# Patient Record
Sex: Female | Born: 1975 | Race: White | Hispanic: No | Marital: Married | Smoking: Current every day smoker
Health system: Southern US, Community
[De-identification: ages and names within clinical notes are randomized; demographics above are authoritative.]

## PROBLEM LIST (undated history)

## (undated) DIAGNOSIS — K589 Irritable bowel syndrome without diarrhea: Secondary | ICD-10-CM

## (undated) DIAGNOSIS — I499 Cardiac arrhythmia, unspecified: Secondary | ICD-10-CM

## (undated) DIAGNOSIS — T7840XA Allergy, unspecified, initial encounter: Secondary | ICD-10-CM

## (undated) DIAGNOSIS — G4730 Sleep apnea, unspecified: Secondary | ICD-10-CM

## (undated) DIAGNOSIS — F329 Major depressive disorder, single episode, unspecified: Secondary | ICD-10-CM

## (undated) DIAGNOSIS — N6320 Unspecified lump in the left breast, unspecified quadrant: Secondary | ICD-10-CM

## (undated) DIAGNOSIS — G2581 Restless legs syndrome: Secondary | ICD-10-CM

## (undated) DIAGNOSIS — D649 Anemia, unspecified: Secondary | ICD-10-CM

## (undated) DIAGNOSIS — Z803 Family history of malignant neoplasm of breast: Secondary | ICD-10-CM

## (undated) DIAGNOSIS — J45909 Unspecified asthma, uncomplicated: Secondary | ICD-10-CM

## (undated) DIAGNOSIS — R0600 Dyspnea, unspecified: Secondary | ICD-10-CM

## (undated) DIAGNOSIS — K219 Gastro-esophageal reflux disease without esophagitis: Secondary | ICD-10-CM

## (undated) DIAGNOSIS — R5383 Other fatigue: Secondary | ICD-10-CM

## (undated) DIAGNOSIS — M797 Fibromyalgia: Secondary | ICD-10-CM

## (undated) DIAGNOSIS — F32A Depression, unspecified: Secondary | ICD-10-CM

## (undated) DIAGNOSIS — J680 Bronchitis and pneumonitis due to chemicals, gases, fumes and vapors: Secondary | ICD-10-CM

## (undated) DIAGNOSIS — I341 Nonrheumatic mitral (valve) prolapse: Secondary | ICD-10-CM

## (undated) DIAGNOSIS — F419 Anxiety disorder, unspecified: Secondary | ICD-10-CM

## (undated) DIAGNOSIS — Z8489 Family history of other specified conditions: Secondary | ICD-10-CM

## (undated) HISTORY — DX: Unspecified lump in the left breast, unspecified quadrant: N63.20

## (undated) HISTORY — DX: Other fatigue: R53.83

## (undated) HISTORY — PX: DILATION AND CURETTAGE OF UTERUS: SHX78

## (undated) HISTORY — PX: NASAL SEPTUM SURGERY: SHX37

## (undated) HISTORY — DX: Allergy, unspecified, initial encounter: T78.40XA

## (undated) HISTORY — DX: Family history of malignant neoplasm of breast: Z80.3

## (undated) HISTORY — DX: Bronchitis and pneumonitis due to chemicals, gases, fumes and vapors: J68.0

## (undated) HISTORY — DX: Sleep apnea, unspecified: G47.30

## (undated) HISTORY — DX: Nonrheumatic mitral (valve) prolapse: I34.1

## (undated) HISTORY — DX: Restless legs syndrome: G25.81

## (undated) HISTORY — DX: Irritable bowel syndrome, unspecified: K58.9

## (undated) HISTORY — DX: Gastro-esophageal reflux disease without esophagitis: K21.9

## (undated) HISTORY — DX: Major depressive disorder, single episode, unspecified: F32.9

## (undated) HISTORY — DX: Anxiety disorder, unspecified: F41.9

## (undated) HISTORY — DX: Unspecified asthma, uncomplicated: J45.909

## (undated) HISTORY — DX: Fibromyalgia: M79.7

## (undated) HISTORY — DX: Depression, unspecified: F32.A

---

## 2007-11-15 ENCOUNTER — Ambulatory Visit: Payer: Self-pay | Admitting: Gastroenterology

## 2008-08-07 ENCOUNTER — Emergency Department: Payer: Self-pay | Admitting: Emergency Medicine

## 2009-03-11 ENCOUNTER — Ambulatory Visit: Payer: Self-pay | Admitting: Family Medicine

## 2009-03-13 ENCOUNTER — Ambulatory Visit: Payer: Self-pay | Admitting: Family Medicine

## 2012-01-01 ENCOUNTER — Emergency Department: Payer: Self-pay | Admitting: Emergency Medicine

## 2012-01-20 ENCOUNTER — Encounter: Payer: Self-pay | Admitting: Obstetrics and Gynecology

## 2012-07-21 ENCOUNTER — Observation Stay: Payer: Self-pay | Admitting: Obstetrics and Gynecology

## 2012-08-01 ENCOUNTER — Observation Stay: Payer: Self-pay | Admitting: Obstetrics and Gynecology

## 2012-08-02 ENCOUNTER — Inpatient Hospital Stay: Payer: Self-pay | Admitting: Obstetrics and Gynecology

## 2012-08-02 LAB — CBC WITH DIFFERENTIAL/PLATELET
Basophil #: 0.1 10*3/uL (ref 0.0–0.1)
Basophil %: 0.4 %
Eosinophil #: 0.1 10*3/uL (ref 0.0–0.7)
HCT: 35.3 % (ref 35.0–47.0)
Lymphocyte #: 1.8 10*3/uL (ref 1.0–3.6)
Lymphocyte %: 12.1 %
MCH: 32.7 pg (ref 26.0–34.0)
MCHC: 35 g/dL (ref 32.0–36.0)
MCV: 93 fL (ref 80–100)
Monocyte #: 0.8 x10 3/mm (ref 0.2–0.9)
Neutrophil #: 11.9 10*3/uL — ABNORMAL HIGH (ref 1.4–6.5)
RDW: 13.2 % (ref 11.5–14.5)
WBC: 14.7 10*3/uL — ABNORMAL HIGH (ref 3.6–11.0)

## 2012-08-04 LAB — HEMATOCRIT: HCT: 32.8 % — ABNORMAL LOW (ref 35.0–47.0)

## 2014-02-08 ENCOUNTER — Ambulatory Visit: Payer: Self-pay

## 2014-03-15 ENCOUNTER — Ambulatory Visit: Payer: Self-pay | Admitting: Unknown Physician Specialty

## 2014-06-10 DIAGNOSIS — F32A Depression, unspecified: Secondary | ICD-10-CM

## 2014-06-10 DIAGNOSIS — IMO0001 Reserved for inherently not codable concepts without codable children: Secondary | ICD-10-CM | POA: Insufficient documentation

## 2014-06-10 DIAGNOSIS — J45909 Unspecified asthma, uncomplicated: Secondary | ICD-10-CM | POA: Insufficient documentation

## 2014-06-10 DIAGNOSIS — K219 Gastro-esophageal reflux disease without esophagitis: Secondary | ICD-10-CM

## 2014-06-10 DIAGNOSIS — F419 Anxiety disorder, unspecified: Secondary | ICD-10-CM | POA: Insufficient documentation

## 2014-06-10 DIAGNOSIS — K279 Peptic ulcer, site unspecified, unspecified as acute or chronic, without hemorrhage or perforation: Secondary | ICD-10-CM | POA: Insufficient documentation

## 2014-06-10 DIAGNOSIS — F329 Major depressive disorder, single episode, unspecified: Secondary | ICD-10-CM | POA: Insufficient documentation

## 2014-06-10 HISTORY — DX: Depression, unspecified: F32.A

## 2014-06-10 HISTORY — DX: Peptic ulcer, site unspecified, unspecified as acute or chronic, without hemorrhage or perforation: K27.9

## 2014-06-10 HISTORY — DX: Anxiety disorder, unspecified: F41.9

## 2014-12-24 ENCOUNTER — Ambulatory Visit: Payer: Self-pay | Admitting: Obstetrics and Gynecology

## 2014-12-24 LAB — CBC
HCT: 39.5 % (ref 35.0–47.0)
HCT: 27.2 % — ABNORMAL LOW (ref 35.0–47.0)
HGB: 12.9 g/dL (ref 12.0–16.0)
HGB: 8.7 g/dL — AB (ref 12.0–16.0)
MCH: 31 pg (ref 26.0–34.0)
MCH: 30.3 pg (ref 26.0–34.0)
MCHC: 32.7 g/dL (ref 32.0–36.0)
MCHC: 31.9 g/dL — ABNORMAL LOW (ref 32.0–36.0)
MCV: 95 fL (ref 80–100)
MCV: 95 fL (ref 80–100)
PLATELETS: 240 10*3/uL (ref 150–440)
Platelet: 203 10*3/uL (ref 150–440)
RBC: 4.17 10*6/uL (ref 3.80–5.20)
RBC: 2.87 10*6/uL — AB (ref 3.80–5.20)
RDW: 12.8 % (ref 11.5–14.5)
RDW: 12.6 % (ref 11.5–14.5)
WBC: 16.3 10*3/uL — ABNORMAL HIGH (ref 3.6–11.0)
WBC: 12.3 10*3/uL — ABNORMAL HIGH (ref 3.6–11.0)

## 2014-12-24 LAB — COMPREHENSIVE METABOLIC PANEL
Albumin: 4 g/dL (ref 3.4–5.0)
Alkaline Phosphatase: 40 U/L — ABNORMAL LOW
Anion Gap: 9 (ref 7–16)
BILIRUBIN TOTAL: 0.2 mg/dL (ref 0.2–1.0)
BUN: 10 mg/dL (ref 7–18)
CHLORIDE: 105 mmol/L (ref 98–107)
CO2: 23 mmol/L (ref 21–32)
Calcium, Total: 8.7 mg/dL (ref 8.5–10.1)
Creatinine: 0.93 mg/dL (ref 0.60–1.30)
EGFR (African American): >60
Glucose: 119 mg/dL — ABNORMAL HIGH (ref 65–99)
Osmolality: 274 (ref 275–301)
Potassium: 3.5 mmol/L (ref 3.5–5.1)
SGOT(AST): 9 U/L — ABNORMAL LOW (ref 15–37)
SGPT (ALT): 15 U/L
Sodium: 137 mmol/L (ref 136–145)
TOTAL PROTEIN: 7.4 g/dL (ref 6.4–8.2)

## 2014-12-24 LAB — HCG, QUANTITATIVE, PREGNANCY: BETA HCG, QUANT.: 3262 m[IU]/mL — AB

## 2014-12-25 LAB — CBC WITH DIFFERENTIAL/PLATELET
BASOS PCT: 0.1 %
Basophil #: 0 10*3/uL (ref 0.0–0.1)
EOS ABS: 0 10*3/uL (ref 0.0–0.7)
Eosinophil %: 0 %
HCT: 21.5 % — ABNORMAL LOW (ref 35.0–47.0)
HGB: 7.2 g/dL — ABNORMAL LOW (ref 12.0–16.0)
LYMPHS PCT: 13.3 %
Lymphocyte #: 1.1 10*3/uL (ref 1.0–3.6)
MCH: 31.3 pg (ref 26.0–34.0)
MCHC: 33.4 g/dL (ref 32.0–36.0)
MCV: 94 fL (ref 80–100)
MONO ABS: 0.4 x10 3/mm (ref 0.2–0.9)
Monocyte %: 4.5 %
NEUTROS ABS: 6.7 10*3/uL — AB (ref 1.4–6.5)
Neutrophil %: 82.1 %
PLATELETS: 166 10*3/uL (ref 150–440)
RBC: 2.29 10*6/uL — ABNORMAL LOW (ref 3.80–5.20)
RDW: 12.5 % (ref 11.5–14.5)
WBC: 8.2 10*3/uL (ref 3.6–11.0)

## 2015-04-14 LAB — SURGICAL PATHOLOGY

## 2015-04-20 NOTE — Op Note (Signed)
PATIENT NAME:  Kristy Shaw, Kristy Shaw MR#:  [id number] DATE OF BIRTH:  [birth year]  DATE OF PROCEDURE:  12/24/2014  PREOPERATIVE DIAGNOSIS: Incomplete abortion in first trimester.   POSTOPERATIVE DIAGNOSIS: Incomplete abortion in first trimester.   PROCEDURE: Suction dilation and curettage.  SURGEON: Christeen DouglasBethany Leeza Heiner, MD   ANESTHESIA: General LMA.   ESTIMATED BLOOD LOSS: 500 mL.  OPERATIVE FLUIDS: 500 mL.   COMPLICATIONS: None.   FINDINGS: A 12 week size retroverted uterus with a patulous open cervix to approximately 3 cm. Large clots and products of conception were noted in the cervix and the vagina. Brisk bright red bleeding.   SPECIMENS: Products of conception x2, including those removed from the vagina at the start of the case as well as the suction D and C specimen and endometrial curettings.   CONDITION: Stable.   COMPLICATIONS: None.   INDICATION FOR PROCEDURE: Ms. Kristy Shaw is a 39 year old gravida 3, para 1-0-1-1 who is approximately 11 weeks estimated gestational age by her last menstrual period. She presented to the Emergency Room with heavy vaginal bleeding. She began bleeding 4 days ago. Her vaginal bleeding worsened yesterday with the appearance of large blood clots. By the end of the day the bleeding had tapered somewhat, but increased this morning significantly and is accompanied with cramping. The patient began saturating several pads an hour and began to become dizzy with ambulation. She presented to the Emergency Department where an initial hemoglobin was noted to be 12.9. Over the course of several hours, this dropped to 8.7, and mild tachycardia was noted. An ultrasound found heterogeneous clots in the uterus and lower uterine segment and cervix. Her beta-hCG at this time was 3262. For the reasons noted above, she was consented to come to the operating room for dilation and curettage. The patient and her husband were both present for the consenting process and agreed to  the risks and benefits of the surgery. Initial plan was a same day surgery with discharge later this evening if the patient was meeting discharge criteria; however, after the surgery I think it prudent to admit her for overnight observation. Of note, the patient is blood type A positive with negative antibodies.   DESCRIPTION OF PROCEDURE: The patient was taken to the operating room where she was identified by name and birthdate. She was placed on the operating table in a supine position and general LMA anesthesia was induced without difficulty. She was then positioned in dorsal lithotomy position using candy cane stirrups and prepped and draped in the usual sterile fashion. A bimanual exam noted large clot that appeared to be products of conception in the vaginal vault. These were sent as a specimen. The cervix was noted to be significantly open with bright red bleeding coming from it. A formal timeout procedure was performed with all team members present and in agreement. Her bladder was evacuated through a straight cath for about 300 mL of clear, yellow urine.   No dilation was necessary. The anterior lip of the cervix was grasped with a single-tooth tenaculum and a weighted speculum was placed in the vagina. A 10 rigid, curved curette was introduced into the retroverted uterus for 3 passes. A sharp curettage was undertaken until all 4 quadrants of the uterus were gritty on palpation. A final suction curettage was passed through the uterus. Bimanual exam at this time noted a boggy fundus and still 10 to 12 weeks uterus. Bimanual massage was undertaken and 0.2 mg of Methergine was given IM. At the close  of the procedure, the cervix was still open. Bleeding is minimal. The tenaculum was removed from the anterior lip of the cervix and the speculum was removed from the vagina. Bimanual massage was again undertaken in an attempt to cause the uterus to clamp down. Bleeding remained minimal at the close of the  procedure, but the cervix was still open. General anesthesia was reversed without difficulty, and the patient is stable in recovery.   Of note, 100 mg of doxycycline was given prior to the start of the procedure and 200 mg of oral doxycycline will be given at the close of the procedure when the patient is able to tolerate p.o. My anticipation is to have her admitted for overnight obs on oral Methergine for 24 hours.  ____________________________ Cline Cools, MD beb:sb D: 12/24/2014 16:36:52 ET T: 12/24/2014 16:59:39 ET JOB#: [id number]  cc: Cline Cools, MD, <Dictator> Cline Cools MD ELECTRONICALLY SIGNED 12/31/2014 9:27

## 2015-04-20 NOTE — H&P (Signed)
Subjective/Chief Complaint Vaginal bleeding in first trimester of pregnancy   History of Present Illness 39yo G3P1011 at approx [redacted] weeks EGA by LMP presented to ER with heavy vaginal bleeding x4 days, worsening yesterday with the appearance of clots. Bleeding tapered off overnight, but increased this morning significantly and pt became dizzy with ambulation. She is still bleeding now. HGB dropped in ER from 12.9 to 8.7 with mild tachycardia. Ultrasound noted heterogenous clots in uterus. bhcg 3262. Mild dizziness on ambulation, endorses cramping, stomach tenderness, vaginal bleeding. Denies n/v, diarrhea, constipation, dysuria, SOB, fever, chills.  Blood type A positive with neg antibodies  Present with husband and father in law in room   Past History PMH: GERD, sleep apnea, anxiety/depression, asthma, mitral valve prolapse PSH: Nasal surgery, NSVD x1, WTE POBHx: NSVD x1 at term, SAB in first timester sponteously resolved SH: Smoker 1/2 ppd  Allergies: NKDA Meds: None currently, weaned with positive pregnancy test   Code Status Full Code   Past Med/Surgical Hx:  Mitral Valve Prolapse: pt states, mild case, does not take any meds for it  restless leg syndrome:   Depression:   Anxiety:   Wisdom Tooth Extraction: 10-56yrs ago  Denies surgical history.:   ALLERGIES:  No Known Allergies:   Review of Systems:  ROS See HPI   Physical Exam:  GEN well developed, no acute distress   HEENT PERRL, hearing intact to voice, moist oral mucosa, good dentition   NECK supple  trachea midline   RESP normal resp effort  no use of accessory muscles  wheezing  rhonchi   CARD regular rate  no murmur  No LE edema  no Rub   ABD positive tenderness  normal BS   GU no superpubic tenderness   LYMPH negative neck   EXTR negative cyanosis/clubbing, negative edema   SKIN normal to palpation, skin turgor good   NEURO grossly normal   PSYCH alert, A+O to time, place, person, good insight,  not anxious   Lab Results:  Routine BB:  05-Jan-16 10:05   ABO Group + Rh Type A Positive  Antibody Screen NEGATIVE (Result(s) reported on 24 Dec 2014 at 11:14AM.)  Routine Chem:  05-Jan-16 10:05   HCG Betasubunit Quant. Serum  3262 (1-3  (International Unit)  ----------------- Non-pregnant <5 Weeks Post LMP mIU/mL  3- 4 wk 9 - 130  4- 5 wk 75 - 2,600  5- 6 wk 850 - 20,800  6- 7 wk 4,000 - 100,000  7-12 wk 11,500 - 289,000 12-16 wk 18,000 - 137,000 16-29 wk 1,400 - 53,000 29-41 wk 940 - 60,000)  BUN 10  Creatinine (comp) 0.93  Routine Hem:  05-Jan-16 10:05   WBC (CBC)  16.3  Hemoglobin (CBC) 12.9  Hematocrit (CBC) 39.5  Platelet Count (CBC) 240 (Result(s) reported on 24 Dec 2014 at 10:28AM.)    13:33   WBC (CBC)  12.3  Hemoglobin (CBC)  8.7  Hematocrit (CBC)  27.2  Platelet Count (CBC) 203 (Result(s) reported on 24 Dec 2014 at 01:58PM.)   Radiology Results: [place]:    05-Jan-16 12:34, US OB Less Than 14 Weeks w/ Transvaginal  US OB Less Than 14 Weeks w/ Transvaginal  REASON FOR EXAM:    heavy vaginal bleeding, [redacted] weeks pregnant  COMMENTS:       PROCEDURE: [place]  - US OB LESS THAN 14 WEEKS/W TRANS  - Dec 24 2014 12:34PM     CLINICAL DATA:  Heavy vaginal bleeding.  [redacted] weeks pregnant.  EXAM:  OBSTETRIC <14 WK US AND TRANSVAGINAL OB US    TECHNIQUE:  Both transabdominal and transvaginal ultrasound examinations were  performed for complete evaluation of the gestation as well as the  maternal uterus, adnexal regions, and pelvic cul-de-sac.  Transvaginal technique was performed to assess early pregnancy.  COMPARISON:  None.    FINDINGS:  Intrauterine gestational sac: None    Yolk sac:  None    Embryo:  None    Maternal uterus/adnexae: Uterus measures 12.2 x 4.9 x 6.7 cm. Large  heterogeneous area in the lower uterine segment and cervical region.  There is no significant blood flow within this heterogeneous area  and this is probably represents blood clots.  Endometrial stripe  roughly measures 0.4 cm. Normal appearance of the right ovary  measuring 2.7 x 2.5 x2.8 cm. Left ovary measures 2.2 x 1.6 x 1.8 cm  with a central follicle. No significant free fluid.     IMPRESSION:  No evidence for an intrauterine pregnancy.    Large heterogeneous area in the lower uterine segment and cervical  region. Findings probably related to blood clots and represent an  abortion in progress.      Electronically Signed    By: Richarda OverlieAdam  Henn M.D.    On: 12/24/2014 13:25       Verified By: Arn MedalADAM R. HENN, M.D.,    Assessment/Admission Diagnosis 39yo G3P1011 at 11wks by LMP with incomplete abortion with continued bleeding,mild tachycardia and lab evidence of signficant drop in Hgb by 4 grams   Plan Plan to the OR for suction dilation and curettage for incomplete AB Level 2 - Rh positive, no Rhogam indicated - Doxycycline 100mg  prior to procedure and 200mg  after - Consents signed, including blood transfusion consent. Pt and Father in law at bedside. Risks, benefits and expected outcomes discussed with patient, including bleeding, infection, damage to bladder and bowels with laparoscopic conversion covered. All questions answered.   Electronic Signatures: Cline CoolsBeasley, Payam Gribble E (MD)  (Signed 05-Jan-16 15:36)  Authored: CHIEF COMPLAINT and HISTORY, PAST MEDICAL/SURGIAL HISTORY, ALLERGIES, HOME MEDICATIONS, REVIEW OF SYSTEMS, PHYSICAL EXAM, LABS, Radiology, ASSESSMENT AND PLAN   Last Updated: 05-Jan-16 15:36 by Cline CoolsBeasley, Johnmatthew Solorio E (MD)

## 2015-04-29 NOTE — H&P (Signed)
L&D Evaluation:  History:   HPI 39 y/o G2P0010 @ [redacted]wks gestation arrives with c/o baby moving less and iregular mild uc's, unsure if leaking fluid increased discharge today. Care @ [facility] hx depression (sx improved ceased wellbutrin) smoker 3-5cigs day GBS negative    Presents with contractions    Patient's Medical History No Chronic Illness    Patient's Surgical History none    Medications Pre Natal Vitamins    Allergies NKDA    Social History tobacco    Family History Non-Contributory   ROS:   ROS All systems were reviewed.  HEENT, CNS, GI, GU, Respiratory, CV, Renal and Musculoskeletal systems were found to be normal.   Exam:   Vital Signs stable    Urine Protein not completed    General no apparent distress    Mental Status clear    Chest clear    Heart normal sinus rhythm    Abdomen gravid, non-tender    Estimated Fetal Weight Average for gestational age    Fetal Position vtx    Fundal Height term    Back no CVAT    Edema no edema    Reflexes 1+    Clonus negative    Pelvic no external lesions, 3cm 80% vtx @ -1 BBOW AROM clear fluid nl bloody show    Description clear    FHT normal rate with no decels, baseline 140's 150's avg variability with accels    Fetal Heart Rate 136    Ucx irregular    Skin dry    Lymph no lymphadenopathy   Impression:   Impression early labor   Plan:   Plan EFM/NST, monitor contractions and for cervical change    Comments Admitted, explained plan of care, what to expect, questions answered. Partner supportive at bedside. Plans epidural with labor progress.   Electronic Signatures: Albertina ParrLugiano, Abriel Geesey B (CNM)  (Signed 14-Aug-13 16:52)  Authored: L&D Evaluation   Last Updated: 14-Aug-13 16:52 by Albertina ParrLugiano, Voncile Schwarz B (CNM)

## 2015-09-12 ENCOUNTER — Ambulatory Visit (INDEPENDENT_AMBULATORY_CARE_PROVIDER_SITE_OTHER): Payer: BLUE CROSS/BLUE SHIELD | Admitting: Licensed Clinical Social Worker

## 2015-09-12 DIAGNOSIS — F39 Unspecified mood [affective] disorder: Secondary | ICD-10-CM | POA: Diagnosis not present
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
## 2015-09-23 ENCOUNTER — Ambulatory Visit: Payer: BLUE CROSS/BLUE SHIELD | Admitting: Licensed Clinical Social Worker

## 2015-09-24 ENCOUNTER — Ambulatory Visit (INDEPENDENT_AMBULATORY_CARE_PROVIDER_SITE_OTHER): Payer: BLUE CROSS/BLUE SHIELD | Admitting: Psychiatry

## 2015-09-24 ENCOUNTER — Encounter: Payer: Self-pay | Admitting: Psychiatry

## 2015-09-24 VITALS — BP 118/72 | HR 75 | Temp 97.8°F | Ht 71.0 in | Wt 140.4 lb

## 2015-09-24 DIAGNOSIS — F331 Major depressive disorder, recurrent, moderate: Secondary | ICD-10-CM | POA: Diagnosis not present

## 2015-09-24 DIAGNOSIS — F39 Unspecified mood [affective] disorder: Secondary | ICD-10-CM | POA: Diagnosis not present

## 2015-09-24 NOTE — Progress Notes (Addendum)
Psychiatric Initial Adult Assessment   Patient Identification: Kristy Shaw MRN:  [id number] Date of Evaluation:  09/24/2015 Referral Source: LCSW Chief Complaint:  "My main concern is I and really irritable all the time." Chief Complaint    Establish Care; Depression; Fatigue     Visit Diagnosis:    ICD-9-CM ICD-10-CM   1. Mood disorder (HCC) 296.90 F39   2. Major depressive disorder, recurrent episode, moderate (HCC) 296.32 F33.1    Diagnosis:   Patient Active Problem List   Diagnosis Date Noted  . Anxiety [F41.9] 06/10/2014  . 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 Clinical depression [F32.9] 06/10/2014  . Gastroduodenal ulcer [K27.9] 06/10/2014  . Reflux [K21.9] 06/10/2014   History of Present Illness:  Patient indicates that she has suffered off and on from depression since high school. She states her most recent episode began of over the last few years. She states that in 2005 she was having marital strain in her first marriage and also had a miscarriage. She states that she became talkative, restless and felt like she needed to clean things. She states at that time she did get treatment with medications and attended marital therapy. She indicated that around 2008 or 2009 she started antidepressants and recall that she got good benefit from Effexor for several years. She states eventually she thought the Effexor stopped working.  She states then she and her second marriage in 2013 and they had 1 child together. She relates that she ended up getting pregnant with their second child in 2015 and then had a miscarriage in January 2016. She states she feels like she is in somewhat of a depression where she has a depressed mood, low energy, poor concentration and anhedonia. She also states she has low sex drive and is not sure that this is simply a reaction to having a miscarriage and wanting to avoid activity that would cause her to have recurrent thoughts of her miscarriage or  just simply a factor of her low mood. She indicates that her primary care did start some amitriptyline after the Effexor stopped working and then switched her to Wellbutrin XL while she was in her pregnancy. However patient states that she took the Wellbutrin for 2 months after her miscarriage and then stop. She states most recently her primary care has given her another prescription to restart Effexor but she has not taken it as she was dealing with some side effects from antibiotic for a tick bite earlier this year and she did not want to restart Effexor while she was dealing with the issues from the antibiotic.  She states that she has moods that caused her to be more talkative, restless and clean things. When asked about a hypomanic or manic episode she states she cannot really recall that ever occurring. However she did see Dr. Maryruth Bun in September and October 2015. She states that she was given prescriptions of Depakote which she took for 2 weeks. She states she was given a prescription for Latuda but she never took that medicine.    Elements:  Duration:  As noted above. Associated Signs/Symptoms: Depression Symptoms:  depressed mood, anhedonia, insomnia, difficulty concentrating, anxiety, loss of energy/fatigue, (Hypo) Manic Symptoms:  Irritable Mood, Talking fast, feeling restless, cleaning Anxiety Symptoms:  None Psychotic Symptoms:  None PTSD Symptoms: NA  Past Medical History:  Past Medical History  Diagnosis Date  . Anxiety   . Asthma   . Bipolar disorder (HCC)   . Mitral valve prolapse   .  Depression   . Fatigue   . Restless leg syndrome   . Fibromyalgia     Past Surgical History  Procedure Laterality Date  . Nasal septum surgery     Family History:  Family History  Problem Relation Age of Onset  . Hypertension Mother   . Stroke Mother   . Heart attack Father   . Stroke Father   . Depression Father   . Depression Sister   . Kidney disease Brother   . ADD /  ADHD Brother   . Asthma Brother    Social History:   Social History   Social History  . Marital Status: Married    Spouse Name: N/A  . Number of Children: N/A  . Years of Education: N/A   Social History Main Topics  . Smoking status: Current Every Day Smoker -- 0.50 packs/day    Types: Cigarettes    Start date: 09/23/1990  . Smokeless tobacco: Never Used  . Alcohol Use: No  . Drug Use: No  . Sexual Activity: Not Currently   Other Topics Concern  . None   Social History Narrative  . None   Additional Social History: Patient states she was the middle child and she had 2 older brothers and 2 younger sisters. She states she never felt like she had a colleague or peer amongst her siblings because there was an age difference between her oldest sibling and an age difference between her and her youngest sibling. He was married in 2005 and separated in 2007 from her first husband. She's been married to her second husband since 2013. They have a re--year-old daughter.  Patient graduated from high school. She denies any repetition or special education. She got in an associates degree in criminal justice from a [employer] 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  Musculoskeletal: Strength & Muscle Tone: within normal limits Gait & Station: normal Patient leans: N/A  Psychiatric Specialty Exam: HPI  Review of Systems  Psychiatric/Behavioral: Positive for depression. Negative for suicidal ideas, hallucinations, memory loss and substance abuse. The patient has insomnia. The patient is not nervous/anxious.   All other systems reviewed and are negative.   Blood pressure 118/72, pulse 75, temperature 97.8 F (36.6 C), temperature source Tympanic, height  (1.803 m), weight 140 lb 6.4 oz (63.685 kg), last menstrual period 09/02/2015, SpO2 98 %.Body mass index is 19.59 kg/(m^2).  General  Appearance: Well Groomed  Eye Contact:  Good  Speech:  Normal Rate  Volume:  Normal  Mood:  "blah"  Affect:  Appropriate and Able to smile and laugh  Thought Process:  Linear and Logical  Orientation:  Full (Time, Place, and Person)  Thought Content:  Negative  Suicidal Thoughts:  No  Homicidal Thoughts:  No  Memory:  Immediate;   Good Recent;   Good Remote;   Good  Judgement:  Good  Insight:  Good  Psychomotor Activity:  Negative  Concentration:  Good  Recall:  Good  Fund of Knowledge:Good  Language: Good  Akathisia:  Negative    AIMS (if indicated):  N/A  Assets:  Communication Skills Desire for Improvement Social Support  ADL's:  Intact  Cognition: WNL  Sleep:  poor   Is the patient at risk to self?  No. Has the patient been a risk to self in the past 6 months?  No. Has the patient been a risk to self within the distant  past?  No. Is the patient a risk to others?  No. Has the patient been a risk to others in the past 6 months?  No. Has the patient been a risk to others within the distant past?  No.  Allergies:   Allergies  Allergen Reactions  . Doxycycline Rash   Current Medications: Current Outpatient Prescriptions  Medication Sig Dispense Refill  . albuterol (PROAIR HFA) 108 (90 BASE) MCG/ACT inhaler Inhale into the lungs.    . gabapentin (NEURONTIN) 100 MG capsule Take by mouth.    . venlafaxine (EFFEXOR) 75 MG tablet Take 75 mg by mouth 1 day or 1 dose.     No current facility-administered medications for this visit.    Previous Psychotropic Medications: Yes  Patient Joyce Gross she's had trials of Lexapro, Prozac and felt they did not work. She states she's been on Zoloft and Paxil but that was a long time ago when she can't remember any response to that. She states that Effexor and amitriptyline in the work for her. She states Wellbutrin she is not sure as she did take it for 2 months after her most recent miscarriage. Substance Abuse History in the last 12  months:  No. The patient smokes less than one pack per day for the past 25 years. She states she might drink 2 times a year. She denies any symptoms consistent with an alcohol use disorder. She denies any use of illicit drugs.   Consequences of Substance Abuse: NA  Medical Decision Making:  Established Problem, Worsening (2) and Review of Medication Regimen & Side Effects (2)  Treatment Plan Summary: Medication management and Plan   Major depressive disorder, mixed features Patient reports symptoms of depression with some symptoms consistent with hypomania. However these symptoms do not seem to have risen to the level of a manic episode or hypomanic episode. It is likely that the patient may be more consistent with a major depressive disorder with mixed features. She reports irritability, restlessness, some hyperactivity from cleaning things and increased talking. She has responded well in the past to Effexor. [place] at this time she artery has a prescription for Effexor 75 mg her primary care. She will start this medication and follow up in 1 month. Time we can assess whether to augment with Abilify, Seroquel to augment for depression and perhaps also provide some mood stability for her mixed features. She is receiving Neurontin to manage her pain for/fibromyalgia and thus this is being managed by her primary care physician.  She believes she was given the immediate release form of Effexor. However she states she will check at home and bring in this information to the CMA at her next appointment with her therapist on 09/26/2015.  In regards to risk assessment the patient has affective illness and race as risk factors. She has protective factors of minor child living in the home, prior suicide attempts, engage in treatment, forward thinking, social supports from husband and gender. At this time low risk of imminent harm to herself or others.    Patient contacted the clinic on 09/26/2015. She clarified  that the prescription for Effexor that she has his Effexor XR 75 mg. She also states that in the past her Wellbutrin trials consisted of Wellbutrin XL 300 mg and Wellbutrin SR 100 mg.   Wallace Going 10/5/201612:02 PM

## 2015-09-26 ENCOUNTER — Other Ambulatory Visit: Payer: Self-pay

## 2015-09-26 ENCOUNTER — Ambulatory Visit (INDEPENDENT_AMBULATORY_CARE_PROVIDER_SITE_OTHER): Payer: BLUE CROSS/BLUE SHIELD | Admitting: Licensed Clinical Social Worker

## 2015-09-26 DIAGNOSIS — F39 Unspecified mood [affective] disorder: Secondary | ICD-10-CM | POA: Diagnosis not present

## 2015-09-26 NOTE — Telephone Encounter (Signed)
pt states that she is takin the velafaxine hcl er 75 mg.   she states she also has takin in the past bupropion hcl xr    and bupropion hcl sr  because you had told her that you might add that to what she is already taking

## 2015-09-26 NOTE — Progress Notes (Signed)
   THERAPIST PROGRESS NOTE  Session Time:  Participation Level: Active  Behavioral Response: CasualAlertDepressed  Type of Therapy: Individual Therapy  Treatment Goals addressed: Coping  Interventions: Motivational Interviewing, Solution Focused and Reframing  Summary: Kristy Shaw is a 39 y.o. female who presents with continued symptoms of her diagnosis.  She continues to have difficulty with managing her thoughts and feelings.  She has attempted to use coping skills taught.  Will continue to provide self care.  Explored feelings of being overwhelmed and how to combat.     Suicidal/Homicidal: Nowithout intent/plan  Therapist Response: Assessed mood.  Explored thoughts feelings toward self and other relationship.  Will continue to use coping skills and provide self care  Plan: Return again in 2 weeks.  Diagnosis: Axis I: Generalized Anxiety Disorder    Axis II: No diagnosis    Marinda Elk 09/26/2015

## 2015-10-09 ENCOUNTER — Ambulatory Visit (INDEPENDENT_AMBULATORY_CARE_PROVIDER_SITE_OTHER): Payer: BLUE CROSS/BLUE SHIELD | Admitting: Licensed Clinical Social Worker

## 2015-10-09 DIAGNOSIS — F331 Major depressive disorder, recurrent, moderate: Secondary | ICD-10-CM

## 2015-10-09 DIAGNOSIS — F39 Unspecified mood [affective] disorder: Secondary | ICD-10-CM

## 2015-10-14 NOTE — Progress Notes (Signed)
   THERAPIST PROGRESS NOTE  Session Time: 65min  Participation Level: Active  Behavioral Response: NeatAlertAnxious  Type of Therapy: Individual Therapy  Treatment Goals addressed: Coping  Interventions: CBT, Motivational Interviewing, Solution Focused, Supportive, Family Systems and Reframing  Summary: Kristy Shaw is a 39 y.o. female who presents with continued symptoms of her diagnosis. She expressed her current stressors and the coping strategies that she has attempted.  Role played with Therapist to learn to communicate to her husband and mother in law.  Suicidal/Homicidal: Nowithout intent/plan  Therapist Response: Assessed mood.  Actively listened as she described her current stressors.  Role played communication strategies  Plan: Return again in 2 weeks.  Diagnosis: Axis I: Mood Disorder NOS    Axis II: No diagnosis    Kristy Shaw 10/14/2015

## 2015-10-27 ENCOUNTER — Ambulatory Visit: Payer: Self-pay | Admitting: Psychiatry

## 2015-10-28 ENCOUNTER — Ambulatory Visit (INDEPENDENT_AMBULATORY_CARE_PROVIDER_SITE_OTHER): Payer: BLUE CROSS/BLUE SHIELD | Admitting: Psychiatry

## 2015-10-28 ENCOUNTER — Ambulatory Visit (INDEPENDENT_AMBULATORY_CARE_PROVIDER_SITE_OTHER): Payer: BLUE CROSS/BLUE SHIELD | Admitting: Licensed Clinical Social Worker

## 2015-10-28 ENCOUNTER — Encounter: Payer: Self-pay | Admitting: Psychiatry

## 2015-10-28 VITALS — BP 132/88 | HR 73 | Temp 97.8°F | Ht 71.0 in | Wt 141.2 lb

## 2015-10-28 DIAGNOSIS — F331 Major depressive disorder, recurrent, moderate: Secondary | ICD-10-CM

## 2015-10-28 NOTE — Progress Notes (Signed)
BH MD/PA/NP OP Progress Note  10/28/2015 10:38 AM Kristy Shaw  MRN:  [id number]  Subjective:  Patient returns for follow-up of her major depressive disorder, recurrent, moderate. At the last visit we decided she would move forward with starting Effexor XR. Patient states that she has noticed her mood is improved. She states her irritability has also improved. She feels like the medications working. In regards to side effects she states the only side effect she may be experiencing is a little bit of stimulation feeling but states that it's not interfering with anything or causing her any distress.  She states that her husband has noticed an improvement. She states that her appetite is good. She states she's able to enjoy things and states she is getting ready for the holidays. Chief Complaint: Better Chief Complaint    Follow-up; Medication Refill     Visit Diagnosis:     ICD-9-CM ICD-10-CM   1. Major depressive disorder, recurrent episode, moderate (HCC) 296.32 F33.1     Past Medical History:  Past Medical History  Diagnosis Date  . Anxiety   . Asthma   . Bipolar disorder (HCC)   . Mitral valve prolapse   . Depression   . Fatigue   . Restless leg syndrome   . Fibromyalgia     Past Surgical History  Procedure Laterality Date  . Nasal septum surgery     Family History:  Family History  Problem Relation Age of Onset  . Hypertension Mother   . Stroke Mother   . Heart attack Father   . Stroke Father   . Depression Father   . Depression Sister   . Kidney disease Brother   . ADD / ADHD Brother   . Asthma Brother    Social History:  Social History   Social History  . Marital Status: Married    Spouse Name: N/A  . Number of Children: N/A  . Years of Education: N/A   Social History Main Topics  . Smoking status: Current Every Day Smoker -- 0.50 packs/day    Types: Cigarettes    Start date: 09/23/1990  . Smokeless tobacco: Never Used  . Alcohol Use: No  . Drug  Use: No  . Sexual Activity: Yes    Birth Control/ Protection: None   Other Topics Concern  . None   Social History Narrative   Additional History:   Assessment:   Musculoskeletal: Strength & Muscle Tone: within normal limits Gait & Station: normal Patient leans: N/A  Psychiatric Specialty Exam: HPI  Review of Systems  Psychiatric/Behavioral: Negative for depression, suicidal ideas, memory loss and substance abuse. The patient is not nervous/anxious and does not have insomnia.   All other systems reviewed and are negative.   Blood pressure 132/88, pulse 73, temperature 97.8 F (36.6 C), temperature source Tympanic, height 5\' 11"  (1.803 m), weight 141 lb 3.2 oz (64.048 kg), last menstrual period 10/23/2015, SpO2 98 %.Body mass index is 19.7 kg/(m^2).  General Appearance: Well Groomed  Eye Contact:  Good  Speech:  Normal Rate  Volume:  Normal  Mood:  Better  Affect:  Bright, smiling  Thought Process:  Linear  Orientation:  Full (Time, Place, and Person)  Thought Content:  Negative  Suicidal Thoughts:  No  Homicidal Thoughts:  No  Memory:  Immediate;   Good Recent;   Good Remote;   Good  Judgement:  Good  Insight:  Good  Psychomotor Activity:  Negative  Concentration:  Good  Recall:  Good  Fund of Knowledge: Good  Language: Good  Akathisia:  Negative  Handed:    AIMS (if indicated):    Assets:  Communication Skills Desire for Improvement Social Support  ADL's:  Intact  Cognition: WNL  Sleep:  good   Is the patient at risk to self?  No. Has the patient been a risk to self in the past 6 months?  No. Has the patient been a risk to self within the distant past?  No. Is the patient a risk to others?  No. Has the patient been a risk to others in the past 6 months?  No. Has the patient been a risk to others within the distant past?  No.  Current Medications: Current Outpatient Prescriptions  Medication Sig Dispense Refill  . albuterol (PROAIR HFA) 108 (90 BASE)  MCG/ACT inhaler Inhale into the lungs.    . gabapentin (NEURONTIN) 100 MG capsule Take by mouth.    . meloxicam (MOBIC) 15 MG tablet Take 15 mg by mouth daily.  5  . venlafaxine XR (EFFEXOR-XR) 75 MG 24 hr capsule Take 75 mg by mouth daily with breakfast.     No current facility-administered medications for this visit.    Medical Decision Making:  Established Problem, Stable/Improving (1) and Review of Medication Regimen & Side Effects (2)  Treatment Plan Summary:Medication management and Plan   Major depressive disorder, mixed features patient reports a good response and improvement to Effexor XR 75 mg daily. Thus at this time we will continue her on this regimen without any changes. She indicates she likely has refills from her primary care physician who initially wrote her a prescription for this medication. However I did tell her that most likely her primary care physician is can want me to take over management. Patient indicated that she has a full bottle and when she gets low on that bottle she will contact this office to change over her prescription to this office.  In regards to risk assessment the patient has affective illness and race as risk factors. She has protective factors of minor child living in the home, no prior suicide attempts, engage in treatment, forward thinking, social supports from husband and gender. At this time low risk of imminent harm to herself or others.    Wallace Going 10/28/2015, 10:38 AM

## 2015-10-29 NOTE — Progress Notes (Signed)
   THERAPIST PROGRESS NOTE  Session Time: 60min  Participation Level: Active  Behavioral Response: CasualAlertAppropriate  Type of Therapy: Individual Therapy  Treatment Goals addressed: Coping  Interventions: CBT, Motivational Interviewing, Solution Focused, Strength-based, Supportive, Family Systems and Reframing  Summary: Kristy Shaw is a 39 y.o. female who presents with continued symptoms of her diagnosis although she states that her symptoms are currently tolerable.  She states that she is currently taking her medication as prescribed and has assisted with increasing her tolerance level.  She states that she continues to have the same stressors such as her mother in law and low finances.  She was able to talk through her ability to use coping skills and states that she is working on how to deal with the upcoming holidays with both families.  Suicidal/Homicidal: Negativewithout intent/plan  Therapist Response: LCSW provided Patient with ongoing emotional support and encouragement.  Normalized her feelings.  Commended Patient on her progress and reinforced the importance of client staying focused on her own strengths and resources and resiliency. Processed various strategies for dealing with stressors.    Plan: Return again in 2 weeks.  Diagnosis: Axis I: Major Depression    Axis II: No diagnosis    Kristy Shaw 10/29/2015

## 2015-12-12 ENCOUNTER — Ambulatory Visit (INDEPENDENT_AMBULATORY_CARE_PROVIDER_SITE_OTHER): Payer: Self-pay | Admitting: Licensed Clinical Social Worker

## 2015-12-12 DIAGNOSIS — F331 Major depressive disorder, recurrent, moderate: Secondary | ICD-10-CM

## 2015-12-21 DIAGNOSIS — N6320 Unspecified lump in the left breast, unspecified quadrant: Secondary | ICD-10-CM

## 2015-12-21 HISTORY — DX: Unspecified lump in the left breast, unspecified quadrant: N63.20

## 2015-12-21 HISTORY — PX: BREAST BIOPSY: SHX20

## 2015-12-23 NOTE — Progress Notes (Signed)
   THERAPIST PROGRESS NOTE  Session Time: 60min  Participation Level: Active  Behavioral Response: Casual and NeatAlertAppropriate  Type of Therapy: Individual Therapy  Treatment Goals addressed: Coping  Interventions: CBT, Motivational Interviewing, Solution Focused, Supportive, Family Systems and Reframing  Summary: Deolinda Ostrom is a [id number] y.o. female who presents with continued symptoms of her diagnosis.  She continues to struggle with her moods although she does not get upset as quick as once in the past.  Jill Sidelison continues to struggle with communicating with her husband regarding finances and how she is feeling about his mother being disrespectful.  She is currently working part time cleaning houses.  Explored stressors and how to manage them.  Role played coping skills.  Suicidal/Homicidal: Nowithout intent/plan  Therapist Response: LCSW provided Patient with ongoing emotional support and encouragement.  Normalized her feelings.  Commended Patient on her progress and reinforced the importance of client staying focused on her own strengths and resources and resiliency. Processed various strategies for dealing with stressors.    Plan: Return again in 2 weeks.  Diagnosis: Axis I: Depression    Axis II: No diagnosis    Marinda Elkicole M Peacock 12/11/2016

## 2015-12-26 ENCOUNTER — Ambulatory Visit: Payer: BLUE CROSS/BLUE SHIELD | Admitting: Psychiatry

## 2016-01-13 ENCOUNTER — Ambulatory Visit (INDEPENDENT_AMBULATORY_CARE_PROVIDER_SITE_OTHER): Payer: BLUE CROSS/BLUE SHIELD | Admitting: Licensed Clinical Social Worker

## 2016-01-13 DIAGNOSIS — F331 Major depressive disorder, recurrent, moderate: Secondary | ICD-10-CM

## 2016-01-20 ENCOUNTER — Encounter: Payer: Self-pay | Admitting: Psychiatry

## 2016-01-20 ENCOUNTER — Ambulatory Visit (INDEPENDENT_AMBULATORY_CARE_PROVIDER_SITE_OTHER): Payer: Self-pay | Admitting: Psychiatry

## 2016-01-20 VITALS — BP 118/76 | HR 75 | Temp 97.6°F | Ht 71.0 in | Wt 137.6 lb

## 2016-01-20 DIAGNOSIS — F331 Major depressive disorder, recurrent, moderate: Secondary | ICD-10-CM

## 2016-01-20 MED ORDER — VENLAFAXINE HCL ER 75 MG PO CP24: 75.0000 mg | ORAL_CAPSULE | ORAL | Status: DC

## 2016-01-20 NOTE — Progress Notes (Signed)
BH MD/PA/NP OP Progress Note  01/20/2016 11:02 AM Kristy Shaw  MRN:  [id number]  Subjective:  Patient returns for follow-up of her major depressive disorder, recurrent, moderate. He is doing well on the Effexor and denies any side effects. Patient indicates she is doing well. 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 She states her appetite is good and she is sleeping well. Ever she states she is sleepy a lot of the time and when she sits down she sometimes will fall asleep. She states she tries to avoid this because she does know it can interfere with her sleep at bedtime. Thus with her whether she was having some thyroid issues and she stated she believed her primary care I'll reassess this issue. He also inquired as to whether her primary care could manage her Effexor especially in situations in which she might not be able to afford the co-pays for this clinic. I told her that ideally a psychiatrist monitoring the medication would want to be the one writing for the medicine. However I told her she could explore with her primary care physician they were comfortable managing it. However for now I indicated that the plan should be she continues to come to this clinic for follow-up and if there becomes issues with affordability she could've been approach her primary care at that time.  Chief Complaint: good Chief Complaint    Follow-up; Medication Refill     Visit Diagnosis:     ICD-9-CM ICD-10-CM   1. Major depressive disorder, recurrent episode, moderate (HCC) 296.32 F33.1     Past Medical History:  Past Medical History  Diagnosis Date  . Anxiety   . Asthma   . Bipolar disorder (HCC)   . Mitral valve prolapse   . Depression   . Fatigue   . Restless leg syndrome   . Fibromyalgia     Past Surgical History  Procedure Laterality Date  . Nasal septum surgery     Family History:  Family History  Problem  Relation Age of Onset  . Hypertension Mother   . Stroke Mother   . Heart attack Father   . Stroke Father   . Depression Father   . Depression Sister   . Kidney disease Brother   . ADD / ADHD Brother   . Asthma Brother    Social History:  Social History   Social History  . Marital Status: Married    Spouse Name: N/A  . Number of Children: N/A  . Years of Education: N/A   Social History Main Topics  . Smoking status: Current Every Day Smoker -- 0.50 packs/day    Types: Cigarettes    Start date: 09/23/1990  . Smokeless tobacco: Never Used  . Alcohol Use: No  . Drug Use: No  . Sexual Activity: Yes    Birth Control/ Protection: None   Other Topics Concern  . None   Social History Narrative   Additional History:   Assessment:   Musculoskeletal: Strength & Muscle Tone: within normal limits Gait & Station: normal Patient leans: N/A  Psychiatric Specialty Exam: HPI  Review of Systems  Psychiatric/Behavioral: Negative for depression, suicidal ideas, hallucinations, memory loss and substance abuse. The patient is not nervous/anxious and does not have insomnia.   All other systems reviewed and are negative.   Blood pressure 118/76, pulse 75, temperature 97.6 F (36.4 C), temperature source Tympanic, height   (1.803 m), weight 137 lb 9.6 oz (62.415 kg), last menstrual period 01/13/2016, SpO2 99 %.Body mass index is 19.2 kg/(m^2).  General Appearance: Well Groomed  Eye Contact:  Good  Speech:  Normal Rate  Volume:  Normal  Mood:  good  Affect:  Bright, smiling  Thought Process:  Linear  Orientation:  Full (Time, Place, and Person)  Thought Content:  Negative  Suicidal Thoughts:  No  Homicidal Thoughts:  No  Memory:  Immediate;   Good Recent;   Good Remote;   Good  Judgement:  Good  Insight:  Good  Psychomotor Activity:  Negative  Concentration:  Good  Recall:  Good  Fund of Knowledge: Good  Language: Good  Akathisia:  Negative  Handed:    AIMS (if  indicated):    Assets:  Communication Skills Desire for Improvement Social Support  ADL's:  Intact  Cognition: WNL  Sleep:  good   Is the patient at risk to self?  No. Has the patient been a risk to self in the past 6 months?  No. Has the patient been a risk to self within the distant past?  No. Is the patient a risk to others?  No. Has the patient been a risk to others in the past 6 months?  No. Has the patient been a risk to others within the distant past?  No.  Current Medications: Current Outpatient Prescriptions  Medication Sig Dispense Refill  . albuterol (PROAIR HFA) 108 (90 BASE) MCG/ACT inhaler Inhale into the lungs.    . gabapentin (NEURONTIN) 100 MG capsule Take by mouth.    . venlafaxine XR (EFFEXOR-XR) 75 MG 24 hr capsule Take 1 capsule (75 mg total) by mouth every morning. 30 capsule 4   No current facility-administered medications for this visit.    Medical Decision Making:  Established Problem, Stable/Improving (1) and Review of Medication Regimen & Side Effects (2)  Treatment Plan Summary:Medication management and Plan   Major depressive disorder, mixed features patient reports a good response and improvement to Effexor XR 75 mg daily. Thus at this time we will continue her on this regimen without any changes.  She was already aware of my departure given that she had called to cancel a scheduled appointment and staff informed her. I informed her she would continue treatment with another psychiatrist within this clinic. She's been encouraged call any questions or concerns prior to her next appointment.    Wallace Going 01/20/2016, 11:02 AM

## 2016-01-21 NOTE — Progress Notes (Signed)
   THERAPIST PROGRESS NOTE  Session Time:  Participation Level: Active  Behavioral Response: CasualAlertIrritable  Type of Therapy: Individual Therapy  Treatment Goals addressed: Coping  Interventions: CBT, Motivational Interviewing, Solution Focused, Supportive, Family Systems and Reframing  Summary: Shirelle Tootle is a 40 y.o. female who presents with continued symptoms of her diagnosis.  She was able to vent her frustration and discuss her current stressors.  Patient continues to learn communication strategies to express herself to her husband and mother in law.  Explored alternative to financial budgeting and was able to create a budget for next month.  Continues to be stressed about her inabilty to work fulltime.  Suicidal/Homicidal: Nowithout intent/plan  Therapist Response: LCSW provided Patient with ongoing emotional support and encouragement.  Normalized her feelings.  Commended Patient on her progress and reinforced the importance of client staying focused on her own strengths and resources and resiliency. Processed various strategies for dealing with stressors.    Plan: Return again in 2 weeks.  Diagnosis: Axis I: Bipolar, mixed    Axis II: No diagnosis    Marinda Elk 01/21/2016

## 2016-02-06 ENCOUNTER — Other Ambulatory Visit: Payer: Self-pay | Admitting: Internal Medicine

## 2016-02-06 DIAGNOSIS — Z1231 Encounter for screening mammogram for malignant neoplasm of breast: Secondary | ICD-10-CM

## 2016-02-10 ENCOUNTER — Ambulatory Visit (INDEPENDENT_AMBULATORY_CARE_PROVIDER_SITE_OTHER): Payer: BLUE CROSS/BLUE SHIELD | Admitting: Licensed Clinical Social Worker

## 2016-02-10 DIAGNOSIS — F331 Major depressive disorder, recurrent, moderate: Secondary | ICD-10-CM | POA: Diagnosis not present

## 2016-02-11 NOTE — Progress Notes (Signed)
   THERAPIST PROGRESS NOTE  Session Time:  Participation Level: Active  Behavioral Response: CasualAlertDepressed  Type of Therapy: Individual Therapy  Treatment Goals addressed: Coping  Interventions: CBT, Motivational Interviewing, Solution Focused, Supportive, Family Systems and Reframing  Summary: Kristy Shaw is a 40 y.o. female who presents with continued symptoms of her diagnosis.  Review of management of current and past stressors.  Provided effective coping strategies while Therapist is out of maternity leave.  Discussion of self care in order to ensure reduction in stress management. Provided Patient with a list of Therapist in the area.  Suicidal/Homicidal: Nowithout intent/plan  Therapist Response: LCSW provided Patient with ongoing emotional support and encouragement.  Normalized her feelings.  Commended Patient on her progress and reinforced the importance of client staying focused on her own strengths and resources and resiliency. Processed various strategies for dealing with stressors.    Plan: PRN  Diagnosis: Axis I: Depression    Axis II: No diagnosis    Marinda Elk 02/11/2016

## 2016-02-17 ENCOUNTER — Ambulatory Visit
Admission: RE | Admit: 2016-02-17 | Discharge: 2016-02-17 | Disposition: A | Discharge status: Home / Self Care | Payer: BLUE CROSS/BLUE SHIELD | Source: Ambulatory Visit | Attending: Internal Medicine | Admitting: Internal Medicine

## 2016-02-17 DIAGNOSIS — Z1231 Encounter for screening mammogram for malignant neoplasm of breast: Secondary | ICD-10-CM

## 2016-02-19 ENCOUNTER — Other Ambulatory Visit: Payer: Self-pay | Admitting: Internal Medicine

## 2016-02-19 DIAGNOSIS — R928 Other abnormal and inconclusive findings on diagnostic imaging of breast: Secondary | ICD-10-CM

## 2016-03-02 ENCOUNTER — Ambulatory Visit
Admission: RE | Admit: 2016-03-02 | Discharge: 2016-03-02 | Disposition: A | Discharge status: Home / Self Care | Payer: BLUE CROSS/BLUE SHIELD | Source: Ambulatory Visit | Attending: Internal Medicine | Admitting: Internal Medicine

## 2016-03-02 DIAGNOSIS — R928 Other abnormal and inconclusive findings on diagnostic imaging of breast: Secondary | ICD-10-CM

## 2016-03-02 DIAGNOSIS — N63 Unspecified lump in breast: Secondary | ICD-10-CM | POA: Insufficient documentation

## 2016-03-09 ENCOUNTER — Other Ambulatory Visit: Payer: Self-pay | Admitting: Internal Medicine

## 2016-03-09 DIAGNOSIS — N63 Unspecified lump in unspecified breast: Secondary | ICD-10-CM

## 2016-03-12 ENCOUNTER — Other Ambulatory Visit: Payer: Self-pay | Admitting: Internal Medicine

## 2016-03-12 ENCOUNTER — Ambulatory Visit: Payer: BLUE CROSS/BLUE SHIELD

## 2016-03-12 ENCOUNTER — Ambulatory Visit
Admission: RE | Admit: 2016-03-12 | Discharge: 2016-03-12 | Disposition: A | Discharge status: Home / Self Care | Payer: BLUE CROSS/BLUE SHIELD | Source: Ambulatory Visit | Attending: Internal Medicine | Admitting: Internal Medicine

## 2016-03-12 DIAGNOSIS — R928 Other abnormal and inconclusive findings on diagnostic imaging of breast: Secondary | ICD-10-CM

## 2016-03-12 DIAGNOSIS — N63 Unspecified lump in unspecified breast: Secondary | ICD-10-CM

## 2016-03-18 ENCOUNTER — Ambulatory Visit
Admission: RE | Admit: 2016-03-18 | Discharge: 2016-03-18 | Disposition: A | Discharge status: Home / Self Care | Payer: BLUE CROSS/BLUE SHIELD | Source: Ambulatory Visit | Attending: Internal Medicine | Admitting: Internal Medicine

## 2016-03-18 ENCOUNTER — Other Ambulatory Visit: Payer: Self-pay | Admitting: Internal Medicine

## 2016-03-18 DIAGNOSIS — R928 Other abnormal and inconclusive findings on diagnostic imaging of breast: Secondary | ICD-10-CM

## 2016-03-18 DIAGNOSIS — N63 Unspecified lump in unspecified breast: Secondary | ICD-10-CM

## 2016-03-18 HISTORY — PX: BREAST BIOPSY: SHX20

## 2016-03-25 ENCOUNTER — Encounter: Payer: Self-pay | Admitting: Physical Therapy

## 2016-03-25 ENCOUNTER — Ambulatory Visit: Payer: BLUE CROSS/BLUE SHIELD | Attending: Internal Medicine | Admitting: Physical Therapy

## 2016-03-25 DIAGNOSIS — M6281 Muscle weakness (generalized): Secondary | ICD-10-CM | POA: Insufficient documentation

## 2016-03-25 DIAGNOSIS — M533 Sacrococcygeal disorders, not elsewhere classified: Secondary | ICD-10-CM | POA: Insufficient documentation

## 2016-03-25 DIAGNOSIS — R29810 Facial weakness: Secondary | ICD-10-CM | POA: Diagnosis present

## 2016-03-25 DIAGNOSIS — R279 Unspecified lack of coordination: Secondary | ICD-10-CM | POA: Diagnosis present

## 2016-03-25 DIAGNOSIS — M629 Disorder of muscle, unspecified: Secondary | ICD-10-CM | POA: Insufficient documentation

## 2016-03-25 NOTE — Patient Instructions (Addendum)
You are now ready to begin training the deep core muscles system: diaphragm, transverse abdominis, pelvic floor . These muscles must work together as a team.           The key to these exercises to train the brain to coordinate the timing of these muscles and to have them turn on for long periods of time to hold you upright against gravity (especially important if you are on your feet all day).These muscles are postural muscles and play a role stabilizing your spine and bodyweight. By doing these repetitions slowly and correctly instead of doing crunches, you will achieve a flatter belly without a lower pooch. You are also placing your spine in a more neutral position and breathing properly which in turn, decreases your risk for problems related to your pelvic floor, abdominal, and low back such as pelvic organ prolapse, hernias, diastasis recti (separation of superficial muscles), disk herniations, spinal fractures. These exercises set a solid foundation for you to later progress to resistance/ strength training with therabands and weights and return to other typical fitness exercises with a stronger deeper core.  Do Level 1 and 2 , 30 reps morning and night    BOdy mechanics with household chores (handout)      Log rolling technique    Use CPAP machine

## 2016-03-26 NOTE — Therapy (Signed)
[facility] MAIN [facility] SERVICES [address] [place], [place], [address] Phone: [phone]   Fax:  [phone]  Physical Therapy Evaluation  Patient Details  Name: Kristy Shaw MRN: [id number] Date of Birth: 05/31/1976 Referring Provider: McLean-Scocuzza   Encounter Date: 03/25/2016      PT End of Session - 03/25/16 1530    Visit Number 1   Number of Visits 12   Date for PT Re-Evaluation 06/17/16   PT Start Time 1430   PT Stop Time 1540   PT Time Calculation (min) 70 min   Activity Tolerance Patient tolerated treatment well;No increased pain   Behavior During Therapy [facility] for tasks assessed/performed      Past Medical History  Diagnosis Date  . Anxiety   . Asthma   . Bipolar disorder (HCC)   . Mitral valve prolapse   . Depression   . Fatigue   . Restless leg syndrome   . Fibromyalgia   . 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 Allergy     seasonal  . Sleep apnea   . Sleep apnea     currently not using machine     Past Surgical History  Procedure Laterality Date  . Nasal septum surgery      There were no vitals filed for this visit.       Subjective Assessment - 03/26/16 2305    Subjective Pt c/o low back pain since childbirth 4 years ago. No pain during pregnancy. Vaginal delivery with tear with stitches. 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 After resting 45 min, pain decreases 5-6/10. 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 2)  Pt also has neck/shoulder pain 2/2 fibromyalgia and difficulty getting up in the morning and out of the house.  5/10.  3) difficulty sleeping on L side due to current tenderness on L side following  biopsies for a lump on her breast.  4) urge incontinence  and SUI    Patient Stated Goals  to be as pain free as possible without  medication and would like to get off gabapentin for fibromyalgia.              [facility] PT Assessment - 03/26/16 2232    Assessment   Medical Diagnosis low back pain, fibromyalgia    Referring Provider McLean-Scocuzza    Precautions   Precautions None   Restrictions   Weight Bearing Restrictions No   Balance Screen   Has the patient fallen in the past 6 months No   Observation/Other Assessments   Observations slumped sitting, legs crossed.     Other Surveys  --  PGQ 55%, PDFI 49%    Coordination   Gross Motor Movements are Fluid and Coordinated --  diaphragmatic breathing w/ pelvic floor ROM   Squat   Comments lifting w/ spinal flexion, squat with narrow BOS   Post-Tx: no p! w/ proper body mechanics   Posture/Postural Control   Postural Limitations --  Forward head: 19 cm B from auditory meatus to acromium   Posture Comments lumbopelvic instability w/ hip flexion    AROM   Overall AROM Comments spinal flexion 60%, sidebend B at 30 % with p!,, ext w/ p! (pre-Tx), less pain in all directions , increased spinal flexion 90% (post-Tx)    Palpation  SI assessment  force closure applied at SIJ  with forward bending w/ decreaseed pain, without force closure with pain    Bed Mobility   Bed Mobility --  crunch method                 Pelvic Floor Special Questions - 03/26/16 2237    Diastasis Recti 2 fingers below sternum, above umbilicus (post-Tx: 1.5 fingers)           [facility] Adult PT Treatment/Exercise - 03/26/16 2232    Therapeutic Activites    Other Therapeutic Activities lifting, sweeping and vaccuming mechanics, log roll, toiletiing mechanics,    Neuro Re-ed    Neuro Re-ed Details  see pt instructions   Manual Therapy   Manual therapy comments fascial mobilization in quadriped to address DRA                PT Education - 03/25/16 1529    Education provided Yes   Education Details POC, HEP, goals, anatomy/physiology, body mechanics w/ work tasks     Starwood HotelsPerson(s) Educated Patient   Methods Explanation;Demonstration;Tactile cues;Verbal cues;Handout   Comprehension Returned demonstration;Verbalized understanding             PT Long Term Goals - 03/26/16 2305    PT LONG TERM GOAL #1   Title Pt will be able to get ready in the morning and out of the house < 1 hr in order to demo IND with energy conservation and managed pain.   Time 12   Period Weeks   Status New   PT LONG TERM GOAL #2   Title Pt will decrease her PGQ score from 55% to < 45% in order to perform ADLs and work tasks.   Time 12   Period Weeks   Status New   PT LONG TERM GOAL #3   Title Pt will decrease her PFDI score from 49%    to < 39% to improve pelvic floor function and improve QOL.   Time 12   Period Weeks   Status New   PT LONG TERM GOAL #4   Title Pt will report decrease pain at neck and shoulders from 5/10 to < 2/10 and demo decreased forward head (change in 19 cm acromium to auditory meatus B to < 17 cm)  in order to prepare for the morning to perform ADLs.    Time 12   Period Weeks   Status New   PT LONG TERM GOAL #5   Title Pt will  report decreased urge incontinence and SUI from 2x/ month to 1x/ month in  order to minimize worsening of pelvic floor dysfunction.   Time 12   Period Weeks   PT LONG TERM GOAL #6   Title Pt will demo 1.5 fingers width below sternum in order to increase intraabdominal pressure for postural stability when lifting and cleaning house.   Time 12   Period Weeks   Status New               Plan - 03/26/16 2304    Clinical Impression Statement Pt is a 40 yo female who c/o low back pain, neck/shoulder pain, poor sleep due to positional discomfort, and urge incontinence/ SUI. These deficits impact her work, sleep quality, and QOL. Pt's clinical presentations include pelvic girdle instability, poor body mechanics with loaded activities, pelvic floor dysfunction,  deep core coordination/ strength, and general weakness. After Tx  today, pt reported pain from 7/10 to 4/10, showed increased spinal mobility  without pain, and improved body mechanics with work simulated activities.       Rehab Potential Good   Clinical Impairments Affecting Rehab Potential co-morbidities, smoker, fibromyalgia, and waiting for biopsy reports for breast lumps, occupation: house-cleaning w/ excessive lifting, vaccuming,and floor to stand     PT Frequency 1x / week   PT Duration 12 weeks   PT Treatment/Interventions ADLs/Self Care Home Management;Aquatic Therapy;Cryotherapy;Biofeedback;Moist Heat;Gait training;Neuromuscular re-education;Balance training;Therapeutic exercise;Therapeutic activities;Functional mobility training;Stair training;Patient/family education;Manual techniques;Taping;Energy conservation;Scar mobilization;Electrical Stimulation   Consulted and Agree with Plan of Care Patient      Patient will benefit from skilled therapeutic intervention in order to improve the following deficits and impairments:  Pain, Impaired sensation, Improper body mechanics, Postural dysfunction, Decreased mobility, Decreased coordination, Decreased endurance, Decreased range of motion, Decreased strength, Decreased balance, Difficulty walking, Decreased activity tolerance, Hypermobility  Visit Diagnosis: Sacroiliac dysfunction  Fascial defect  Unspecified lack of coordination  Facial weakness     Problem List Patient Active Problem List   Diagnosis Date Noted  . Anxiety 06/10/2014  . Airway hyperreactivity 06/10/2014  . Clinical depression 06/10/2014  . Gastroduodenal ulcer 06/10/2014  . Reflux 06/10/2014    Mariane Masters ,PT, DPT, E-RYT  03/26/2016, 11:06 PM  [facility] MAIN [facility] SERVICES [address] [place], [place], [address] Phone: [phone]   Fax:  [phone]  Name: Kristy Shaw MRN: [id number] Date of Birth: 1976-12-18

## 2016-03-26 NOTE — Therapy (Deleted)
[facility] [address] [place], [place], [address] Phone: [phone]   Fax:  [phone]  Physical Therapy Evaluation  Patient Details  Name: Kristy Shaw MRN: [id number] Date of Birth: 06/14/1976 Referring Provider: McLean-Scocuzza   Encounter Date: 03/25/2016      PT End of Session - 03/25/16 1530    Visit Number 1   Number of Visits 12   Date for PT Re-Evaluation 06/17/16   PT Start Time 1430   PT Stop Time 1540   PT Time Calculation (min) 70 min   Activity Tolerance Patient tolerated treatment well;No increased pain   [facility] assessed/performed      Past Medical History  Diagnosis Date  . Anxiety   . Asthma   . Bipolar disorder (HCC)   . Mitral valve prolapse   . Depression   . Fatigue   . Restless leg syndrome   . Fibromyalgia   . 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 Allergy     seasonal  . Sleep apnea   . Sleep apnea     currently not using machine     Past Surgical History  Procedure Laterality Date  . Nasal septum surgery      There were no vitals filed for this visit.           [facility] - 03/26/16 2232    Assessment   Medical Diagnosis low back pain, fibromyalgia    Referring Provider McLean-Scocuzza    Precautions   Precautions None   Restrictions   Weight Bearing Restrictions No   Balance Screen   Has the patient fallen in the past 6 months No   Observation/Other Assessments   Observations slumped sitting, legs crossed    Other Surveys  --  PGQ 55%, PDFI 49%    Coordination   Gross Motor Movements are Fluid and Coordinated --  diaphragmatic breathing w/ pelvic floor ROM   Squat   Comments lifting w/ spinal flexion, squat with narrow BOS   Post-Tx: no p! w/ proper body mechanics   Posture/Postural Control   Posture Comments lumbopelvic instability w/ hip flexion    AROM   Overall AROM Comments spinal flexion 60%, sidebend B at  30 % with p!,, ext w/ p! (pre-Tx), less pain in all directions , increased spinal flexion 90% (post-Tx)    Palpation   SI assessment  force closure applied at SIJ  with forward bending w/ decreaseed pain, without force closure with pain    Bed Mobility   Bed Mobility --  crunch method                 Pelvic Floor Special Questions - 03/26/16 2237    Diastasis Recti 2 fingers below sternum, above umbilicus (post-Tx: 1.5 fingers)           [facility] Adult PT Treatment/Exercise - 03/26/16 2232    Therapeutic Activites    Other Therapeutic Activities lifting, sweeping and vaccuming mechanics, log roll, toiletiing mechanics,    Neuro Re-ed    Neuro Re-ed Details  see pt instructions   Manual Therapy   Manual therapy comments fascial mobilization in quadriped to address DRA                PT Education - 03/25/16 1529    Education provided Yes   Education Details POC, HEP, goals, anatomy/physiology, body mechanics w/ work tasks    [employer] HotelsPerson(s) Educated Patient  Methods Explanation;Demonstration;Tactile cues;Verbal cues;Handout   Comprehension Returned demonstration;Verbalized understanding             PT Long Term Goals - 03/26/16 2240    PT LONG TERM GOAL #1   Title Pt will be able to get ready in the morning and out of the house < 1 hr in order to demo IND with energy conservation and managed pain.   Time 12   Period Weeks   Status New   PT LONG TERM GOAL #2   Title Pt will decrease her PGQ score from 55% to < 45% in order to perform ADLs and work tasks.   Time 12   Period Weeks   Status New   PT LONG TERM GOAL #3   Title Pt will decrease her PFDI score from 49%    to < 39% to improve pelvic floor function and improve QOL.   Time 12   Period Weeks   Status New   PT LONG TERM GOAL #4   Title Pt will report decrease pain at neck and shoulders from 5/10 to < 2/10 in order to prepare for the morning to perform ADLs.    Time 12   Period Weeks   Status New    PT LONG TERM GOAL #5   Title Pt will  report decreased urge incontinence and SUI from 2x/ month to 1x/ month in  order to minimize worsening of pelvic floor dysfunction.   Time 12   Period Weeks               Plan - 03/26/16 2242    Clinical Impression Statement Pt is a 40 yo female who c/o low back pain, neck/shoulder pain, poor sleep due to positional discomfort, and urge incontinence/ SUI. These deficits impact her work, sleep quality, and QOL. Pt's clinical presentations include pelvic girdle instability, poor body mechanics with loaded activities, pelvic floor dysfunction,  deep core coordination/ strength, and general weakness. After Tx today, pt reported pain from 7/10 to 4/10, showed increased spinal mobility without pain, and improved body mechanics with work simulated activities.       Rehab Potential Good   Clinical Impairments Affecting Rehab Potential co-morbidities, smoker, fibromyalgia, and waiting for biopsy reports for breast lumps, occupation: house-cleaning w/ excessive lifting, vaccuming,and floor to stand     PT Frequency 1x / week   PT Duration 12 weeks   PT Treatment/Interventions ADLs/Self Care Home Management;Aquatic Therapy;Cryotherapy;Biofeedback;Moist Heat;Gait training;Neuromuscular re-education;Balance training;Therapeutic exercise;Therapeutic activities;Functional mobility training;Stair training;Patient/family education;Manual techniques;Taping;Energy conservation;Scar mobilization;Electrical Stimulation   Consulted and Agree with Plan of Care Patient      Patient will benefit from skilled therapeutic intervention in order to improve the following deficits and impairments:  Pain, Impaired sensation, Improper body mechanics, Postural dysfunction, Decreased mobility, Decreased coordination, Decreased endurance, Decreased range of motion, Decreased strength, Decreased balance, Difficulty walking, Decreased activity tolerance, Hypermobility  Visit  Diagnosis: Sacroiliac dysfunction  Fascial defect  Unspecified lack of coordination  Facial weakness     Problem List Patient Active Problem List   Diagnosis Date Noted  . Anxiety 06/10/2014  . Airway hyperreactivity 06/10/2014  . Clinical depression 06/10/2014  . Gastroduodenal ulcer 06/10/2014  . Reflux 06/10/2014    Mariane Masters ,PT, DPT, E-RYT  03/26/2016, 10:57 PM  [facility] MAIN [facility] SERVICES [address] [place], [place], [address] Phone: [phone]   Fax:  [phone]  Name: Kristy Shaw MRN: [id number] Date of Birth: 1976/10/19

## 2016-03-30 ENCOUNTER — Other Ambulatory Visit: Payer: Self-pay | Admitting: General Surgery

## 2016-03-30 ENCOUNTER — Ambulatory Visit: Payer: BLUE CROSS/BLUE SHIELD | Admitting: Physical Therapy

## 2016-03-30 DIAGNOSIS — M6281 Muscle weakness (generalized): Secondary | ICD-10-CM

## 2016-03-30 DIAGNOSIS — M533 Sacrococcygeal disorders, not elsewhere classified: Secondary | ICD-10-CM | POA: Diagnosis not present

## 2016-03-30 DIAGNOSIS — N6022 Fibroadenosis of left breast: Secondary | ICD-10-CM

## 2016-03-30 DIAGNOSIS — R279 Unspecified lack of coordination: Secondary | ICD-10-CM

## 2016-03-30 NOTE — Patient Instructions (Addendum)
.  DEEP core level 1 , this week perform  On belly with pillow under hips to decrease sway back..  Hands under forehead.  Expand diaphragm in all four directions (particularly posteriorly) on inhale against floor.   10 x 3 rep  __________________________   DEEP core level 2 On your back     __________________________  Upper trap stretch:  opp armpit , tuck chin, radiate fingers tips towards toes 5 breaths    Elbow circles forward to backward direction to glide shoudler blade back and open chest.   __________________________   Floor to rise by the bed:   Log roll up. Ankles to the side of hips. Double kneeling--> tuck toes under , fore arms on bed, push down on forearms and straighten knees Walk feet closer to bed, hands on hips, rock with knees forward, hips and chest roll up.

## 2016-03-31 NOTE — Therapy (Addendum)
[facility] MAIN [facility] SERVICES [address] [place], [place], [address] Phone: [phone]   Fax:  [phone]  Physical Therapy Treatment  Patient Details  Name: Kristy Shaw MRN: [id number] Date of Birth: 1976-10-23 Referring Provider: Shirlee Latch   Encounter Date: 03/30/2016      PT End of Session - 03/30/16 1929    Visit Number 2   Number of Visits 12   Date for PT Re-Evaluation 06/17/16   PT Start Time 1335   PT Stop Time 1435   PT Time Calculation (min) 60 min   Activity Tolerance Patient tolerated treatment well;No increased pain   Behavior During Therapy [facility] for tasks assessed/performed      Past Medical History  Diagnosis Date  . Anxiety   . Asthma   . Bipolar disorder (HCC)   . Mitral valve prolapse   . Depression   . Fatigue   . Restless leg syndrome   . Fibromyalgia   . 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 Allergy     seasonal  . Sleep apnea   . Sleep apnea     currently not using machine     Past Surgical History  Procedure Laterality Date  . Nasal septum surgery      There were no vitals filed for this visit.      Subjective Assessment - 03/30/16 1944    Subjective Pt reported she felt decrease LBP after leaving last session which lasted for the remainder of the day until she was sitting on her couch in a certain way that caused back pain.  Pt has practiced her HEP on the floor but had pain after getting up from the floor. Pt had no pain during the exercises. Pt states she is feeling anxious (7-8/10) about her pre-surgery to remove benign breast lumps.      Patient Stated Goals  to be as pain free as possible without medication and would like to get off gabapentin for fibromyalgia.              [facility] PT Assessment - 03/30/16 1945    Floor to Stand   Comments poor body mechanics , w/ report of pain. (no report of pain post-training with proper body mechanics)    Palpation   Spinal mobility elevated  diaphragm , (post Tx: increased ability to depress ribcage)                   Pelvic Floor Special Questions - 04/08/16 1935    Diastasis Recti 1.5 fingers width below sternum                               Treatment:  Therapeutic Activites: reviewed vacuuming , demonstrated and practiced floor to rise transfers 5 x \ Neuro-reeducation: see pt instructions manual Tx: intercostal STM, tactile cuing on lateral ribs manual Tx: Bilateral CVJ Grade II mobs           PT Education - 04/08/16 1929    Education provided Yes   Education Details HEP   Person(s) Educated Patient   Methods Explanation;Demonstration;Tactile cues;Verbal cues;Handout   Comprehension Returned demonstration;Verbalized understanding            PT Long Term Goals - 03/31/16 2157    PT LONG TERM GOAL #1   Title Pt will be able to get ready in the morning and out of the house < 1 hr in  order to demo IND with energy conservation and managed pain.   Time 12   Period Weeks   Status On-going   PT LONG TERM GOAL #2   Title Pt will decrease her PGQ score from 55% to < 45% in order to perform ADLs and work tasks.   Time 12   Period Weeks   Status On-going   PT LONG TERM GOAL #3   Title Pt will decrease her PFDI score from 49%    to < 39% to improve pelvic floor function and improve QOL.   Time 12   Period Weeks   Status On-going   PT LONG TERM GOAL #4   Title Pt will report decrease pain at neck and shoulders from 5/10 to < 2/10 and demo decreased forward head (change in 19 cm acromium to auditory meatus B to < 17 cm)  in order to prepare for the morning to perform ADLs.    Time 12   Period Weeks   Status On-going   PT LONG TERM GOAL #5   Title Pt will  report decreased urge incontinence and SUI from 2x/ month to 1x/ month in  order to minimize worsening of pelvic floor dysfunction.   Time 12   Period Weeks   Status On-going   PT LONG TERM GOAL #6   Title Pt will demo 1.5 fingers width  below sternum in order to increase intraabdominal pressure for postural stability when lifting and cleaning house.   Time 12   Period Weeks   Status Achieved               Plan - 03/31/16 2145    Clinical Impression Statement Pt is progressing well with decreased diastasis recti and improved deep core coordination. Today pt  improved body mechanics with loaded activities (floor to rise) but required manual Tx to faciliate  depression of ribcage to optimize diaphragmatic breathing. Pt continues to benefit from skilled PT.    Rehab Potential Good   Clinical Impairments Affecting Rehab Potential co-morbidities, smoker, fibromyalgia, and waiting for biopsy reports for breast lumps, occupation: house-cleaning w/ excessive lifting, vaccuming,and floor to stand     PT Frequency 1x / week   PT Duration 12 weeks   PT Treatment/Interventions ADLs/Self Care Home Management;Aquatic Therapy;Cryotherapy;Biofeedback;Moist Heat;Gait training;Neuromuscular re-education;Balance training;Therapeutic exercise;Therapeutic activities;Functional mobility training;Stair training;Patient/family education;Manual techniques;Taping;Energy conservation;Scar mobilization;Electrical Stimulation   Consulted and Agree with Plan of Care Patient      Patient will benefit from skilled therapeutic intervention in order to improve the following deficits and impairments:  Pain, Impaired sensation, Improper body mechanics, Postural dysfunction, Decreased mobility, Decreased coordination, Decreased endurance, Decreased range of motion, Decreased strength, Decreased balance, Difficulty walking, Decreased activity tolerance, Hypermobility  Visit Diagnosis: Unspecified lack of coordination - Plan: PT plan of care cert/re-cert   Weakness (generalized)  - Plan: PT plan of care cert/re-cert     Problem List Patient Active Problem List   Diagnosis Date Noted  . Anxiety 06/10/2014  . Airway hyperreactivity 06/10/2014  .  Clinical depression 06/10/2014  . Gastroduodenal ulcer 06/10/2014  . Reflux 06/10/2014    Mariane MastersYeung,Shin Yiing ,PT, DPT, E-RYT  04/08/2016, 7:49 PM  [place] [facility]IN [facility] [address] [place], [place], [address] Phone: [phone]   Fax:  [phone]  Name: Nancy Fetterlison Selvy MRN: [id number] Date of Birth: 08/12/1976

## 2016-04-01 ENCOUNTER — Encounter: Payer: BLUE CROSS/BLUE SHIELD | Admitting: Physical Therapy

## 2016-04-02 IMAGING — US US BREAST LTD UNI LEFT INC AXILLA
1 series · 11 of 11 positions shown · non-contrast
Comparison: 02/17/2016

CLINICAL DATA: 39-year-old patient with possible mass in distortion
identified on recent baseline 2D screening mammogram. Both of the
patient's grandmothers have a history of breast cancer.

EXAM:
DIGITAL DIAGNOSTIC LEFT MAMMOGRAM WITH 3D TOMOSYNTHESIS WITH CAD
ULTRASOUND LEFT BREAST

[Series 1: us breast ltd uni left inc axilla · 0.05mm/px · 11 of 11 slices shown]
[im 1/11]
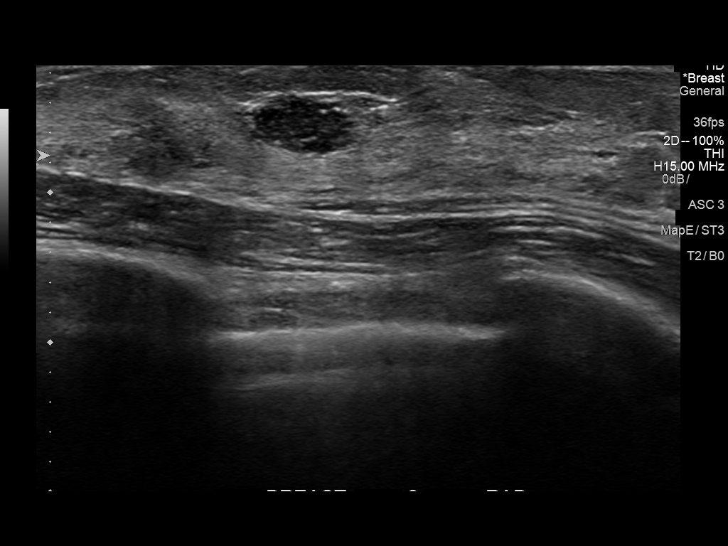
[im 2/11]
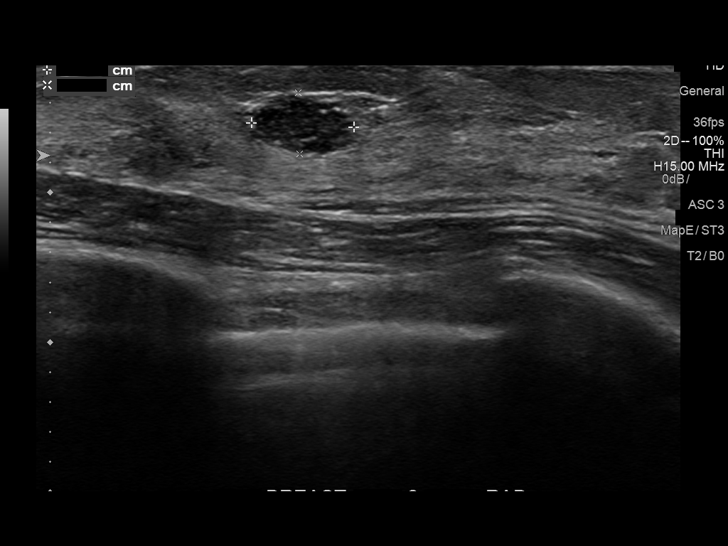
[im 3/11]
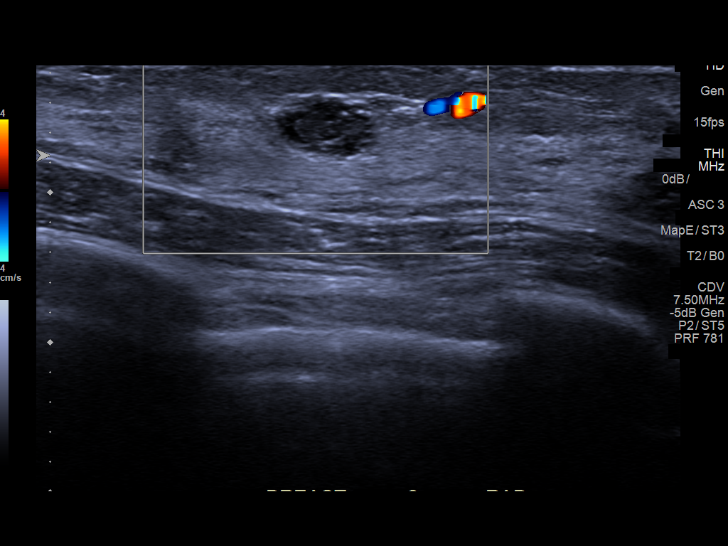
[im 4/11]
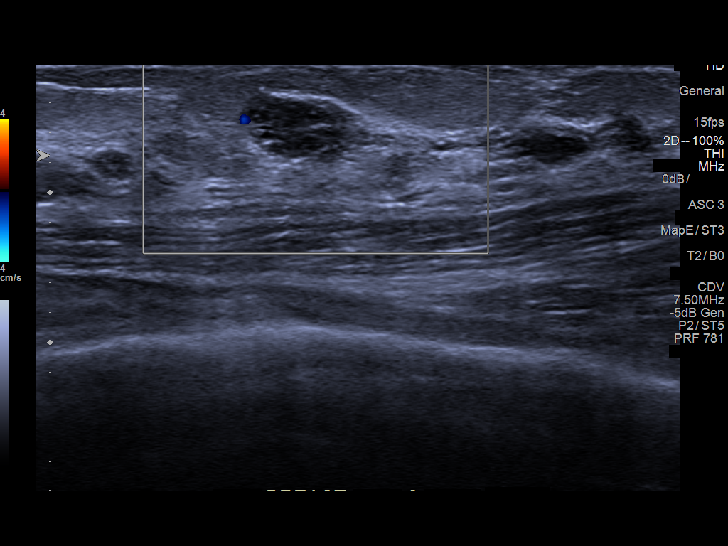
[im 5/11]
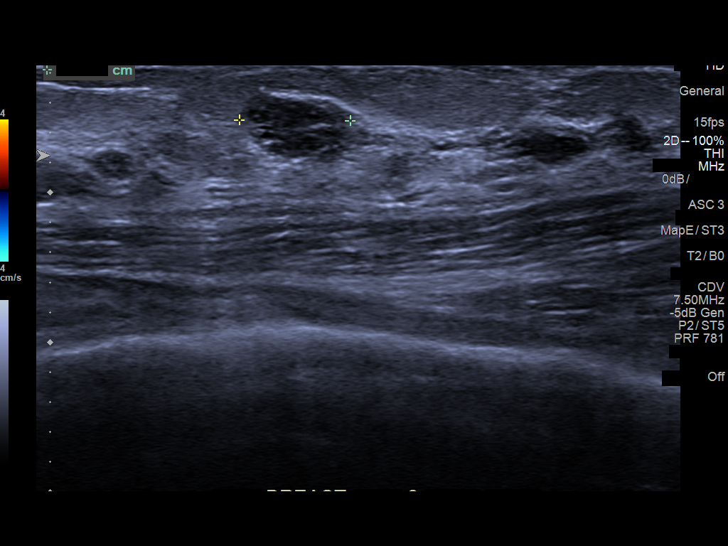
[im 6/11]
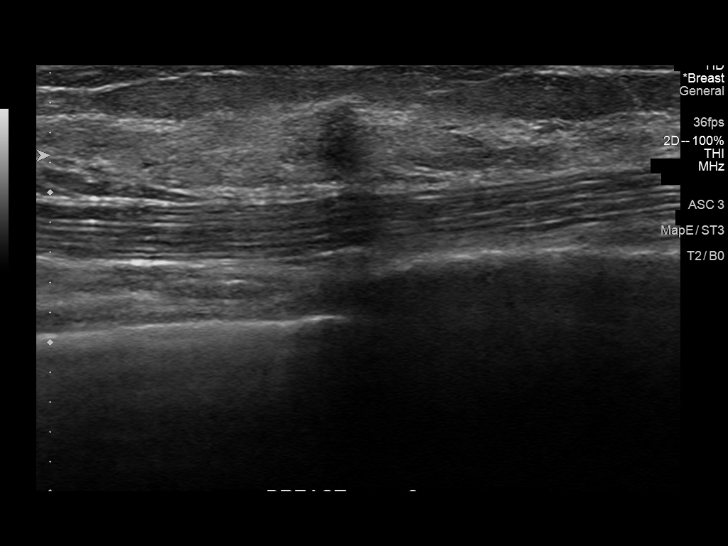
[im 7/11]
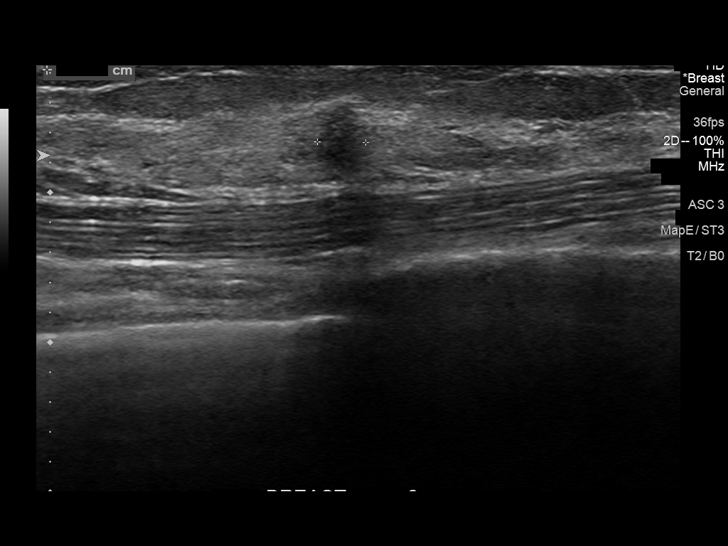
[im 8/11]
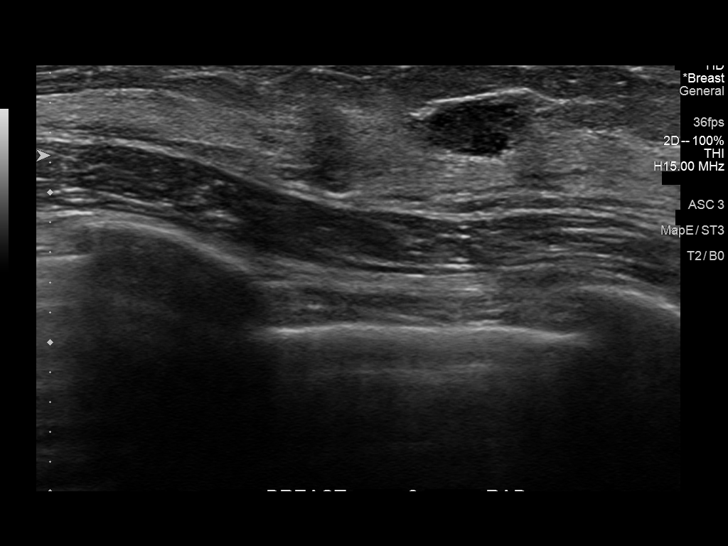
[im 9/11]
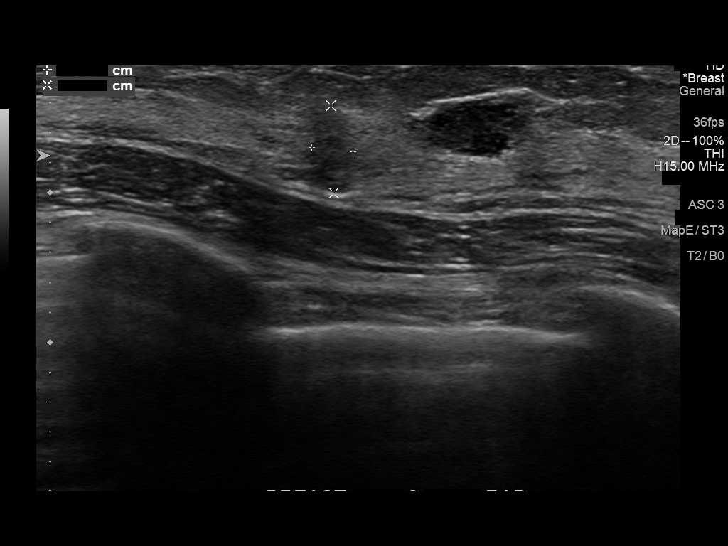
[im 10/11]
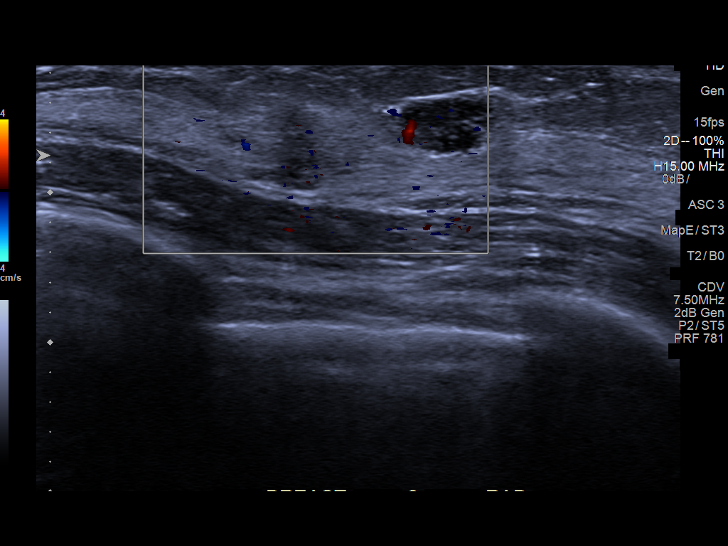
[im 11/11]
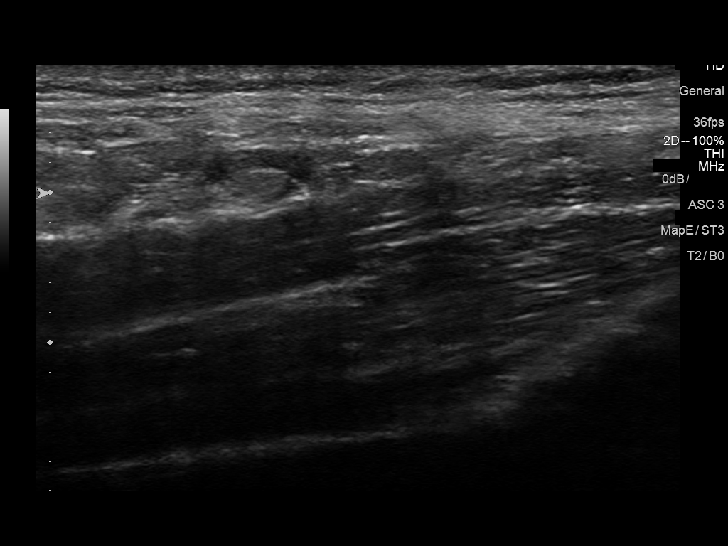

[11 of 11 positions shown; findings below may reference images not displayed]

ACR Breast Density Category c: The breast tissue is heterogeneously
dense, which may obscure small masses.
FINDINGS: Whole breast tomographic views confirm a small mass with associated
distortion in the upper inner left breast.

Mammographic images were processed with CAD.

On physical exam, I do not palpate a discrete mass in the upper
inner quadrant of the left breast. No mass is palpated in the left
axilla.

Targeted ultrasound is performed, showing an irregular hypoechoic
mass with indistinct margins and slight posterior acoustic shadowing
at 10 o'clock position 3 cm from the nipple. This mass measures 3 x
6 x 3 mm. Adjacent to the suspicious mass, but slightly more
lateral, toward 11 o'clock position, is an oval cystic mass with
internal septations/debris that measures 7 x 4 x 7 mm. This mass
appears benign. These 2 masses can be seen in the same imaging
field. Ultrasound of the left axilla shows no evidence of
lymphadenopathy.
IMPRESSION: Suspicious 0.6 x 0.3 x 0.3 cm mass 10 o'clock position left breast.
Malignancy cannot be excluded. Our office will contact ordering
physician and schedule a biopsy.

RECOMMENDATION:

Ultrasound-guided biopsy is recommended.

I have discussed the findings and recommendations with the patient.
Results were also provided in writing at the conclusion of the
visit. If applicable, a reminder letter will be sent to the patient
regarding the next appointment.

BI-RADS CATEGORY  4: Suspicious.

## 2016-04-06 ENCOUNTER — Encounter: Payer: BLUE CROSS/BLUE SHIELD | Admitting: Physical Therapy

## 2016-04-08 ENCOUNTER — Encounter: Payer: BLUE CROSS/BLUE SHIELD | Admitting: Physical Therapy

## 2016-04-08 ENCOUNTER — Ambulatory Visit: Payer: BLUE CROSS/BLUE SHIELD | Admitting: Physical Therapy

## 2016-04-08 DIAGNOSIS — M6281 Muscle weakness (generalized): Secondary | ICD-10-CM

## 2016-04-08 DIAGNOSIS — M533 Sacrococcygeal disorders, not elsewhere classified: Secondary | ICD-10-CM | POA: Diagnosis not present

## 2016-04-08 DIAGNOSIS — R279 Unspecified lack of coordination: Secondary | ICD-10-CM

## 2016-04-08 NOTE — Therapy (Signed)
[facility] MA[facility] [address] [place], [place], [address] Phone: [phone]   Fax:  [phone]  Physical Therapy Treatment  Patient Details  Name: Kristy Shaw MRN: [id number] Date of Birth: 12/21/1975 Referring Provider: Shirlee LatchMcLean   Encounter Date: 04/08/2016      PT End of Session - 04/08/16 1929    Visit Number 3   Number of Visits 12   Date for PT Re-Evaluation 06/17/16   PT Start Time 1310   PT Stop Time 1435   PT Time Calculation (min) 85 min   Activity Tolerance Patient tolerated treatment well;No increased pain   Behavior [facility] Therapy [facility] for tasks assessed/performed      Past Medical History  Diagnosis Date  . Anxiety   . Asthma   . Bipolar disorder (HCC)   . Mitral valve prolapse   . Depression   . Fatigue   . Restless leg syndrome   . Fibromyalgia   . 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 Allergy     seasonal  . Sleep apnea   . Sleep apnea     currently not using machine     Past Surgical History  Procedure Laterality Date  . Nasal septum surgery      There were no vitals filed for this visit.      Subjective Assessment - 04/08/16 1344    Subjective Pt reported she has found it hard to perform her HEP in the morning compared to the night because of caring for her daughter. Pt tried the body mechanics techniques which decreased her back pain 8/10 to 5-6/10. Pt cleaned 2-3x/ week and felt her energy level  and  mm aches have improved.    Patient Stated Goals  to be as pain free as possible without medication and would like to get off gabapentin for fibromyalgia.              [facility] PT Assessment - 04/08/16 1355    Observation/Other Assessments   Observations poor motor control, required stability cues at scapula    Posture/Postural Control   Posture Comments quadriped position with mild cuing for postural stability   AROM   Overall AROM Comments spinal flexion 110 deg no pain.  p! with  sideflexion B and with hip ext (p! at B PSIS)                   Pelvic Floor Special Questions - 04/08/16 1935    Diastasis Recti no fingers   Pelvic Floor Internal Exam pt consented verbally without contraindications    Exam Type Vaginal   Strength fair squeeze, definite lift  posterior > anterior initially, circumferential post Tx   Strength # of reps 3   Strength # of seconds 10   Biofeedback HEP 5 reps, 5 sec            [facility] Adult PT Treatment/Exercise - 04/08/16 1355    Therapeutic Activites    Other Therapeutic Activities see pt instructions   Neuro Re-ed    Neuro Re-ed Details  see pt instructions   Manual Therapy   Manual therapy comments abdominal fascial releases to facilitate relaxation of anterior perineal triangle for more circumferential contraction of pelvic floor mm                 PT Education - 04/08/16 1929    Education provided Yes   Education Details HEP   Person(s) Educated Patient  Methods Explanation;Demonstration;Tactile cues;Verbal cues;Handout   Comprehension Returned demonstration;Verbalized understanding             PT Long Term Goals - 03/31/16 2157    PT LONG TERM GOAL #1   Title Pt will be able to get ready in the morning and out of the house < 1 hr in order to demo IND with energy conservation and managed pain.   Time 12   Period Weeks   Status On-going   PT LONG TERM GOAL #2   Title Pt will decrease her PGQ score from 55% to < 45% in order to perform ADLs and work tasks.   Time 12   Period Weeks   Status On-going   PT LONG TERM GOAL #3   Title Pt will decrease her PFDI score from 49%    to < 39% to improve pelvic floor function and improve QOL.   Time 12   Period Weeks   Status On-going   PT LONG TERM GOAL #4   Title Pt will report decrease pain at neck and shoulders from 5/10 to < 2/10 and demo decreased forward head (change in 19 cm acromium to auditory meatus B to < 17 cm)  in order to prepare for the  morning to perform ADLs.    Time 12   Period Weeks   Status On-going   PT LONG TERM GOAL #5   Title Pt will  report decreased urge incontinence and SUI from 2x/ month to 1x/ month in  order to minimize worsening of pelvic floor dysfunction.   Time 12   Period Weeks   Status On-going   PT LONG TERM GOAL #6   Title Pt will demo 1.5 fingers width below sternum in order to increase intraabdominal pressure for postural stability when lifting and cleaning house.   Time 12   Period Weeks   Status Achieved               Plan - 04/08/16 1929    Clinical Impression Statement Pt showed decreased diastasis recti and improved deep core mm stability. Pt progressed with pelvic floor exercises in addition to core exercises designed to be incorporated with playing with dtr in order to increase compliance withint pt's busy schedule. Required excessive cuing for scapular stabilization with multifidis twist exercise.  Session was longer than usual because pt will not be able to attend session next week and PT ensured pt demo'd understanding with HEP with correct form.     Rehab Potential Good   Clinical Impairments Affecting Rehab Potential co-morbidities, smoker, fibromyalgia, and waiting for biopsy reports for breast lumps, occupation: house-cleaning w/ excessive lifting, vaccuming,and floor to stand     PT Frequency 1x / week   PT Duration 12 weeks   PT Treatment/Interventions ADLs/Self Care Home Management;Aquatic Therapy;Cryotherapy;Biofeedback;Moist Heat;Gait training;Neuromuscular re-education;Balance training;Therapeutic exercise;Therapeutic activities;Functional mobility training;Stair training;Patient/family education;Manual techniques;Taping;Energy conservation;Scar mobilization;Electrical Stimulation   Consulted and Agree with Plan of Care Patient      Patient will benefit from skilled therapeutic intervention in order to improve the following deficits and impairments:  Pain, Impaired  sensation, Improper body mechanics, Postural dysfunction, Decreased mobility, Decreased coordination, Decreased endurance, Decreased range of motion, Decreased strength, Decreased balance, Difficulty walking, Decreased activity tolerance, Hypermobility  Visit Diagnosis: Unspecified lack of coordination  Muscle weakness (generalized)     Problem List Patient Active Problem List   Diagnosis Date Noted  . Anxiety 06/10/2014  . Airway hyperreactivity 06/10/2014  . Clinical depression 06/10/2014  .  Gastroduodenal ulcer 06/10/2014  . Reflux 06/10/2014    Mariane Masters ,PT, DPT, E-RYT  04/08/2016, 7:39 PM  [facility] MAIN [facility] SERVICES [address] [place], [place], [address] Phone: [phone]   Fax:  [phone]  Name: Kristy Shaw MRN: [id number] Date of Birth: [birth year]

## 2016-04-08 NOTE — Addendum Note (Signed)
Addended by: Mariane MastersYEUNG, SHIN-YIING on: 04/08/2016 07:54 PM   Modules accepted: Orders

## 2016-04-08 NOTE — Patient Instructions (Signed)
1) Multifidis twist ( with yellow band) -mini squat ., elbow straight , pencil pits, keep elbows by ribs, turn trunk 20 degs opp of door. KNee and hips dont move. 10 x 2 sets each side    2)  PELVIC FLOOR / KEGEL EXERCISES   Pelvic floor/ Kegel exercises are used to strengthen the muscles in the base of your pelvis that are responsible for supporting your pelvic organs and preventing urine/feces leakage. Based on your therapist's recommendations, they can be performed while standing, sitting, or lying down. Imagine pelvic floor area as a diamond with pelvic landmarks: top =pubic bone, bottom tip=tailbone, sides=sitting bones (ischial tuberosities).    Make yourself aware of this muscle group by using these cues while coordinating your breath:  Inhale, feel pelvic floor diamond area lower like hammock towards your feet and ribcage/belly expanding. Pause. Let the exhale naturally and feel your belly sink, abdominal muscles hugging in around you and you may notice the pelvic diamond draws upward towards your head forming a umbrella shape. Give a squeeze during the exhalation like you are stopping the flow of urine. If you are squeezing the buttock muscles, try to give 50% less effort.   Common Errors:  Breath holding: If you are holding your breath, you may be bearing down against your bladder instead of pulling it up. If you belly bulges up while you are squeezing, you are holding your breath. Be sure to breathe gently in and out while exercising. Counting out loud may help you avoid holding your breath.  Accessory muscle use: You should not see or feel other muscle movement when performing pelvic floor exercises. When done properly, no one can tell that you are performing the exercises. Keep the buttocks, belly and inner thighs relaxed.  Overdoing it: Your muscles can fatigue and stop working for you if you over-exercise. You may actually leak more or feel soreness at the lower abdomen or  rectum.  YOUR HOME EXERCISE PROGRAM  LONG HOLDS: Position: on back  Inhale and then exhale. Then squeeze the muscle and count aloud for  5 seconds. Rest with three long breaths. (Be sure to let belly sink in with exhales and not push outward) Perform 5 repetitions, at night    SHORT HOLDS: Position:  sitting   Inhale and then exhale. Then squeeze the muscle.  (Be sure to let belly sink in with exhales and not push outward)  Perform 5 repetitions, 5  Times/day                      DECREASE DOWNWARD PRESSURE ON  YOUR PELVIC FLOOR, ABDOMINAL, LOW BACK MUSCLES       PRESERVE YOUR PELVIC HEALTH LONG-TERM   ** SQUEEZE pelvic floor BEFORE YOUR SNEEZE, COUGH, LAUGH   ** EXHALE BEFORE YOU RISE AGAINST GRAVITY (lifting, sit to stand, from squat to stand)   ** LOG ROLL OUT OF BED INSTEAD OF CRUNCH/SIT-UP     ______________________  At night: 3)  deep core level '2 30 reps    ______________________ PLAY EXERCISES with Addie x 5 reps of each    Crab crawl (long torso, feet further away heel under knees)  Sideways crawling and also high fives    Bird dog opp arm and leg ( tucked toes, pencils under armpits, yard stick imagery on back (keep neck , back and hips aligned), spread toes of lift leg    Mini squats (knee under toes to activate gluts)  _______________________  

## 2016-04-15 ENCOUNTER — Ambulatory Visit (INDEPENDENT_AMBULATORY_CARE_PROVIDER_SITE_OTHER): Payer: BLUE CROSS/BLUE SHIELD | Admitting: Psychiatry

## 2016-04-15 ENCOUNTER — Ambulatory Visit: Payer: Self-pay | Admitting: Psychiatry

## 2016-04-15 ENCOUNTER — Encounter: Payer: Self-pay | Admitting: Psychiatry

## 2016-04-15 VITALS — BP 118/78 | HR 81 | Temp 98.1°F | Ht 71.0 in | Wt 131.8 lb

## 2016-04-15 DIAGNOSIS — F331 Major depressive disorder, recurrent, moderate: Secondary | ICD-10-CM | POA: Diagnosis not present

## 2016-04-15 MED ORDER — VENLAFAXINE HCL ER 75 MG PO CP24: 75.0000 mg | ORAL_CAPSULE | Freq: Every day | ORAL | Status: DC

## 2016-04-15 NOTE — Progress Notes (Signed)
BH MD/PA/NP OP Progress Note  04/15/2016 10:21 AM Kristy Shaw  MRN:  [id number]  Subjective:  Patient is a 40 year old female who was previously following Dr. Mayford KnifeWilliams. She presented for the follow-up appointment. She has history of major depression. Patient reported that she has recently been diagnosed with a lump in her breast. She stated that she is going for surgery in June as she has some time off around that time. She reported that she has a 41-year-old daughter and her husband is very supportive. She is currently stable on her medication. She takes Effexor at bedtime but it is affecting her sleep. Patient stated that she also takes Neurontin 800 mg due to fibromyalgia and body ache. She has been doing physical therapy on a regular basis. Patient currently denied having any more swings anger anxiety or paranoia.   She  follows with her primary care physician on a regular basis. Patient reported that she does not have any problems with thyroid and she has followed up with her primary care on a regular basis. She feels tired during the daytime.   Chief Complaint: Chief Complaint    Follow-up; Medication Refill     Visit Diagnosis:     ICD-9-CM ICD-10-CM   1. Major depressive disorder, recurrent episode, moderate (HCC) 296.32 F33.1     Past Medical History:  Past Medical History  Diagnosis Date  . Anxiety   . Asthma   . Bipolar disorder (HCC)   . Mitral valve prolapse   . Depression   . Fatigue   . Restless leg syndrome   . Fibromyalgia   . 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 Allergy     seasonal  . Sleep apnea   . Sleep apnea     currently not using machine     Past Surgical History  Procedure Laterality Date  . Nasal septum surgery     Family History:  Family History  Problem Relation Age of Onset  . Hypertension Mother   . Stroke Mother   . Heart attack Father   . Stroke Father   . Depression Father   . Depression Sister   . Kidney disease Brother   . ADD /  ADHD Brother   . Asthma Brother   . Breast cancer Maternal Grandmother [id number]   Social History:  Social History   Social History  . Marital Status: Married    Spouse Name: N/A  . Number of Children: N/A  . Years of Education: N/A   Social History Main Topics  . Smoking status: Current Every Day Smoker -- 0.50 packs/day    Types: Cigarettes    Start date: 09/23/1990  . Smokeless tobacco: Never Used  . Alcohol Use: No  . Drug Use: No  . Sexual Activity: Yes    Birth Control/ Protection: None   Other Topics Concern  . None   Social History Narrative   Additional History:   Assessment:   Musculoskeletal: Strength & Muscle Tone: within normal limits Gait & Station: normal Patient leans: N/A  Psychiatric Specialty Exam: HPI  Review of Systems  Psychiatric/Behavioral: Negative for depression, suicidal ideas, hallucinations, memory loss and substance abuse. The patient is not nervous/anxious and does not have insomnia.   All other systems reviewed and are negative.   Blood pressure 118/78, pulse 81, temperature 98.1 F (36.7 C), temperature source Tympanic, height 5\' 11"  (1.803 m), weight 131 lb 12.8 oz (59.784 kg), last menstrual period 04/08/2016, SpO2 98 %.Body mass index is  18.39 kg/(m^2).  General Appearance: Well Groomed  Eye Contact:  Good  Speech:  Normal Rate  Volume:  Normal  Mood:  good  Affect:  Bright, smiling  Thought Process:  Linear  Orientation:  Full (Time, Place, and Person)  Thought Content:  Negative  Suicidal Thoughts:  No  Homicidal Thoughts:  No  Memory:  Immediate;   Good Recent;   Good Remote;   Good  Judgement:  Good  Insight:  Good  Psychomotor Activity:  Negative  Concentration:  Good  Recall:  Good  Fund of Knowledge: Good  Language: Good  Akathisia:  Negative  Handed:    AIMS (if indicated):    Assets:  Communication Skills Desire for Improvement Social Support  ADL's:  Intact  Cognition: WNL  Sleep:  good   Is the patient  at risk to self?  No. Has the patient been a risk to self in the past 6 months?  No. Has the patient been a risk to self within the distant past?  No. Is the patient a risk to others?  No. Has the patient been a risk to others in the past 6 months?  No. Has the patient been a risk to others within the distant past?  No.  Current Medications: Current Outpatient Prescriptions  Medication Sig Dispense Refill  . albuterol (PROAIR HFA) 108 (90 BASE) MCG/ACT inhaler Inhale into the lungs.    . fluconazole (DIFLUCAN) 150 MG tablet Take 150 mg by mouth daily.  0  . gabapentin (NEURONTIN) 100 MG capsule Take by mouth.    . gabapentin (NEURONTIN) 400 MG capsule Take 400 mg by mouth 2 (two) times daily.  2  . NEOMYCIN-POLYMYXIN-HYDROCORTISONE (CORTISPORIN) 1 % SOLN otic solution 2 (two) times daily.  0  . venlafaxine XR (EFFEXOR-XR) 75 MG 24 hr capsule Take 1 capsule (75 mg total) by mouth every morning. 30 capsule 4   No current facility-administered medications for this visit.    Medical Decision Making:  Established Problem, Stable/Improving (1) and Review of Medication Regimen & Side Effects (2)  Treatment Plan Summary:Medication management and Plan   Major depressive disorder, mixed features patient reports a good response and improvement to Effexor XR 75 mg daily. Thus at this time we will continue her on this regimen without any changes.  Patient also takes Neurontin 800 mg at bedtime. Discussed with her about her medications and she will continue them on a regular basis.  She will follow-up in 2 months or earlier depending on her symptoms   More than 50% of the time spent in psychoeducation, counseling and coordination of care.    This note was generated in part or whole with voice recognition software. Voice regonition is usually quite accurate but there are transcription errors that can and very often do occur. I apologize for any typographical errors that were not detected and  corrected.     Brandy Hale, MD  04/15/2016, 10:21 AM

## 2016-04-16 ENCOUNTER — Other Ambulatory Visit: Payer: Self-pay | Admitting: General Surgery

## 2016-04-16 DIAGNOSIS — N6022 Fibroadenosis of left breast: Secondary | ICD-10-CM

## 2016-04-22 ENCOUNTER — Ambulatory Visit: Payer: BLUE CROSS/BLUE SHIELD | Attending: Internal Medicine | Admitting: Physical Therapy

## 2016-04-22 DIAGNOSIS — M6281 Muscle weakness (generalized): Secondary | ICD-10-CM | POA: Diagnosis present

## 2016-04-22 DIAGNOSIS — R279 Unspecified lack of coordination: Secondary | ICD-10-CM

## 2016-04-22 NOTE — Patient Instructions (Addendum)
PELVIC FLOOR / KEGEL EXERCISES   Pelvic floor/ Kegel exercises are used to strengthen the muscles in the base of your pelvis that are responsible for supporting your pelvic organs and preventing urine/feces leakage. Based on your therapist's recommendations, they can be performed while standing, sitting, or lying down. Imagine pelvic floor area as a diamond with pelvic landmarks: top =pubic bone, bottom tip=tailbone, sides=sitting bones (ischial tuberosities).    Make yourself aware of this muscle group by using these cues while coordinating your breath:  Inhale, feel pelvic floor diamond area lower like hammock towards your feet and ribcage/belly expanding. Pause. Let the exhale naturally and feel your belly sink, abdominal muscles hugging in around you and you may notice the pelvic diamond draws upward towards your head forming a umbrella shape. Give a squeeze during the exhalation like you are stopping the flow of urine. If you are squeezing the buttock muscles, try to give 50% less effort.   Common Errors:  Breath holding: If you are holding your breath, you may be bearing down against your bladder instead of pulling it up. If you belly bulges up while you are squeezing, you are holding your breath. Be sure to breathe gently in and out while exercising. Counting out loud may help you avoid holding your breath.  Accessory muscle use: You should not see or feel other muscle movement when performing pelvic floor exercises. When done properly, no one can tell that you are performing the exercises. Keep the buttocks, belly and inner thighs relaxed.  Overdoing it: Your muscles can fatigue and stop working for you if you over-exercise. You may actually leak more or feel soreness at the lower abdomen or rectum.  YOUR HOME EXERCISE PROGRAM  LONG HOLDS: Position: on back  Inhale and then exhale. Then squeeze the muscle and count aloud for 5 seconds. Rest with three long breaths. (Be sure to let  belly sink in with exhales and not push outward)  Perform 8 repetitions, 3 times/day        URGE SUPPRESION TECHNIQUES  ? These techniques are to be used to suppress those abnormally strong URGES to urinate, especially after you have already urinated < 1hr ago.  ? These steps do not have to be followed in order, and not all steps have to be used.   ? The purpose of these steps is to help you regain control of your bladder, to reduce the amount of urinary urgency, frequency, or leaking.  ? They take practice to master in controlling urgency.  Allow yourself to be okay with leaking when you first start practicing these steps. ? Practice these steps first at home, when you do not have to worry as much about leaking.  1. SIT DOWN.  Pressure on the pelvic floor inhibits the bladder.  Further pressure may help, such as sitting on a small rolled up towel. 2. LIGHT "KEGELS" using elevator imagery.  Breathe in, feel pelvic floor lower to "basement" level by the end of the inhalation. Exhale, feel pelvic floor gradually move up to "1st floor" which is the neutral position of pelvic floor. At the end of exhalation, feel pelvic floor lift higher at which you will perform a quick light squeeze (the muscles that hold back gas and urine).  Do not do hard, maximal contractions, as this will quickly fatigue the muscles and cause leakage. Perform this 5 repetitions in this pattern.  3. BREATHE & STAY CALM.  Breathing slowly and remaining calm will inhibit your sympathetic  nervous system, which will in turn calm the bladder.   4. DISTRACTION.  Sit with a project that will engage your mind.  Anything that works for you - reading, word puzzles, crochet, knitting, checking email, balancing the checkbook, and so on. 5. VISUALIZATION.  Imagine that you are in a place/situation in which either you cannot or do not want to leave.  Examples: in a car and cannot stop; lying on a beach with a far walk to a restroom; at  dinner with someone special.  If the urge persists after practicing these steps and feel you must go to the bathroom, then it is imperative that you . . . . ? Walk slowly and calmly to the bathroom ? Maintain calm breathing ? Refrain from undressing until you are standing over the toilet Rushing to the restroom will only encourage the strong bladder urges and leaking.  Again, the more you practice, the easier these steps will become.        Bridges with resistive bands : feet propped on stool/ low chair  10 reps each x 2   Engage back of body   Elbows bent, knee hip width      Elbows straight , knee apart , ballerina feet

## 2016-04-22 NOTE — Therapy (Signed)
[place] MAIN [facility] SERVICES [address] [place], [place], [address] Phone: [phone]   Fax:  [phone]  Physical Therapy Treatment / Progess Note  Patient Details  Name: Kristy Shaw MRN: [id number] Date of Birth: [birth year] Referring Provider: Aundra Dubin   Encounter Date: 04/22/2016      PT End of Session - 04/22/16 2247    Visit Number 4   Number of Visits 12   Date for PT Re-Evaluation 06/17/16   PT Start Time 6659   PT Stop Time 1640   PT Time Calculation (min) 65 min   Activity Tolerance Patient tolerated treatment well;No increased pain   Behavior During Therapy [facility] for tasks assessed/performed      Past Medical History  Diagnosis Date  . Anxiety   . Asthma   . Bipolar disorder ([place])   . Mitral valve prolapse   . Depression   . Fatigue   . Restless leg syndrome   . Fibromyalgia   . Bronchitis due to chemical ([place])   . Allergy     seasonal  . Sleep apnea   . Sleep apnea     currently not using machine     Past Surgical History  Procedure Laterality Date  . Nasal septum surgery      There were no vitals filed for this visit.      Subjective Assessment - 04/22/16 1536    Subjective Pt has been practicing  body mechanics with cleaning houses and she does not notice her LBP as much. Improvement 25-30%.  Pt has had interrupted sleep after medication changes.    Patient Stated Goals  to be as pain free as possible without medication and would like to get off gabapentin for fibromyalgia.              [facility] PT Assessment - 04/22/16 2243    Observation/Other Assessments   Observations more upright posture without cuing   Other Surveys  --  PGQ 36%, PFDI 41%    Posture/Postural Control   Posture Comments increased lumbopelvic stability                  Pelvic Floor Special Questions - 04/22/16 2244    Diastasis Recti 1 finger below sternum   Pelvic Floor Internal Exam pt consented verbally  without contraindications    Exam Type Vaginal   Strength fair squeeze, definite lift  posterior > anterior initially, circumferential post Tx   Strength # of reps 8   Strength # of seconds 5   Biofeedback cued for rest breaks            [facility] Adult PT Treatment/Exercise - 04/22/16 2243    Neuro Re-ed    Neuro Re-ed Details  urge suppression procotol, proper endurnace pelvic floor trianing (systematic counting with rest breaks) , bladder irritant to water ratio (decrease soda, gradually increase water) , causes for urge incontinence, urgency                PT Education - 04/22/16 2246    Education provided Yes   Education Details HEP, see  under "neuro-reedu"   Person(s) Educated Patient   Methods Explanation;Demonstration;Tactile cues;Verbal cues;Handout   Comprehension Returned demonstration;Verbalized understanding             PT Long Term Goals - 04/22/16 1537    PT LONG TERM GOAL #1   Title Pt will be able to get ready in the morning and out of the house <  1 hr in order to demo IND with energy conservation and managed pain.   Time 12   Period Weeks   Status Partially Met   PT LONG TERM GOAL #2   Title Pt will decrease her PGQ score from 55% to < 45% in order to perform ADLs and work tasks. (5/2: 36%)    Time 12   Period Weeks   Status Achieved   PT LONG TERM GOAL #3   Title Pt will decrease her PFDI score from 49%    to < 39% to improve pelvic floor function and improve QOL. (5/2: 41%)    Time 12   Period Weeks   Status Partially Met   PT LONG TERM GOAL #4   Title Pt will report decrease pain at neck and shoulders from 5/10 to < 2/10 and demo decreased forward head (change in 19 cm acromium to auditory meatus B to < 17 cm)  in order to prepare for the morning to perform ADLs.    Time 12   Period Weeks   Status Partially Met   PT LONG TERM GOAL #5   Title Pt will  report decreasing soda intake, increasing water to achieve a 1:1 ratio in order to  decrease urge incontinence and SUI from 2x/ month to 1x/ month.  (5/4: SUI resolved, urge is 1 x / in 2 week)    Time 12   Period Weeks   Status Partially Met   PT LONG TERM GOAL #6   Title Pt will demo 1.5 fingers width below sternum in order to increase intraabdominal pressure for postural stability when lifting and cleaning house.   Time 12   Period Weeks   Status Achieved              Plan - 04/22/16 2248    Clinical Impression Statement Pt has achieved 2 goals related to decreased abdominal separation of rectus abdominal mm and decreased Pelvic Girdle Questionnaire from 55% to 36%. 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  Pt also reported she had SUI Sx across the past two weeks. Pt required cuing for rest breaks and systematic counting with pelvic floor endurance exercises to avoid over fatigue/ overactivity of mm/ minimize worsening of urge. Pt progressed to outer core mm strengthening (lumbothoracic  fascia complex for upper/lower body) and was educated on urge suppression technique along with bladder irritant education to manage urge incontinence and urgency. Given her SUI has improved, pt would meet the criteria for pool therapy. Anticipate pt would benefit from this type of PT intervention when pt enters wellness/ prevention/ self-maintainence phase of rehab.  The buoyancy and resistance of water may be suitable for her condition of fibromyalgia while she continues to progress with core strengthening. Pt voiced interest.   Pt will continue to benefit skilled PT to achieve her remaining 4 goals.   Rehab Potential Good   Clinical Impairments Affecting Rehab Potential co-morbidities, smoker, fibromyalgia, and waiting for biopsy reports for breast lumps, occupation: house-cleaning w/ excessive lifting, vaccuming,and  floor to stand     PT Frequency 1x / week   PT Duration 12 weeks   PT Treatment/Interventions ADLs/Self Care Home Management;Aquatic Therapy;Cryotherapy;Biofeedback;Moist Heat;Gait training;Neuromuscular re-education;Balance training;Therapeutic exercise;Therapeutic activities;Functional mobility training;Stair training;Patient/family education;Manual techniques;Taping;Energy conservation;Scar mobilization;Electrical Stimulation   Consulted and Agree with Plan of Care Patient      Patient  will benefit from skilled therapeutic intervention in order to improve the following deficits and impairments:  Pain, Impaired sensation, Improper body mechanics, Postural dysfunction, Decreased mobility, Decreased coordination, Decreased endurance, Decreased range of motion, Decreased strength, Decreased balance, Difficulty walking, Decreased activity tolerance, Hypermobility  Visit Diagnosis: No diagnosis found.     Problem List Patient Active Problem List   Diagnosis Date Noted  . Anxiety 06/10/2014  . Airway hyperreactivity 06/10/2014  . Clinical depression 06/10/2014  . Gastroduodenal ulcer 06/10/2014  . Reflux 06/10/2014    Jerl Mina ,PT, DPT, E-RYT  04/22/2016, 10:53 PM  [place] MAIN [facility] SERVICES [address] [place], [place], [address] Phone: [phone]   Fax:  [phone]  Name: Kristy Shaw MRN: [id number] Date of Birth: 10-Oct-1976

## 2016-04-29 ENCOUNTER — Ambulatory Visit: Payer: BLUE CROSS/BLUE SHIELD | Admitting: Physical Therapy

## 2016-04-29 DIAGNOSIS — R279 Unspecified lack of coordination: Secondary | ICD-10-CM | POA: Diagnosis not present

## 2016-04-29 DIAGNOSIS — M6281 Muscle weakness (generalized): Secondary | ICD-10-CM

## 2016-04-29 NOTE — Patient Instructions (Addendum)
To lengthen spine:  Downward facing dog with strap   Child pose  -> table top __> cat/ cow --> Downward facing dog -->child pose    ____________________________  Restorative : 10 min-15 mins    Legs up the wall  childs pose with forearms on blocks (scare crow)  Pillow under head or belly

## 2016-04-29 NOTE — Therapy (Signed)
[place] MAIN [facility] SERVICES [address] [place], [place], [address] Phone: [phone]   Fax:  [phone]  Physical Therapy Treatment  Patient Details  Name: Kristy Shaw MRN: [id number] Date of Birth: [birth year] Referring Provider: Aundra Dubin   Encounter Date: 04/29/2016      PT End of Session - 04/29/16 7893    Visit Number 5   Number of Visits 12   Date for PT Re-Evaluation 06/17/16   PT Start Time 1510   PT Stop Time 1610   PT Time Calculation (min) 60 min   Activity Tolerance Patient tolerated treatment well;No increased pain   Behavior During Therapy [facility] for tasks assessed/performed      Past Medical History  Diagnosis Date  . Anxiety   . Asthma   . Bipolar disorder ([place])   . Mitral valve prolapse   . Depression   . Fatigue   . Restless leg syndrome   . Fibromyalgia   . Bronchitis due to chemical ([place])   . Allergy     seasonal  . Sleep apnea   . Sleep apnea     currently not using machine     Past Surgical History  Procedure Laterality Date  . Nasal septum surgery      There were no vitals filed for this visit.      Subjective Assessment - 04/29/16 1712    Subjective Pt was able to take care of herself and her dtr in the morning in 30 min and she was able to manage the rest day. Pt provided herself a rest break.   Pt's LBP has returned after her daughter kneed her in the spot during the night 2-3 nights ago when they slept in the same bed.  Pain level of  7/10 .    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 Pt has some  tensions in her neck and shoulders.  Pt finds her exercises to help her have the endurance to clean her houses .    Patient Stated Goals  to be as pain free as possible without medication and would like to get off gabapentin for fibromyalgia.              [facility] PT Assessment - 04/29/16 1750    Palpation   Spinal mobility pain at L5 SP (no pain post-Tx)                      [facility]  Adult PT Treatment/Exercise - 04/29/16 1750    Therapeutic Activites    Other Therapeutic Activities restorative poses, post-cleaning stretches to release mm tensions in neck and back   Neuro Re-ed    Neuro Re-ed Details  body scan                 PT Education - 04/29/16 1753    Education provided Yes   Education Details HEP, engery conservation techniques to manage fibromyalgia   Person(s) Educated Patient   Methods Explanation;Demonstration;Tactile cues;Verbal cues;Handout   Comprehension Verbalized understanding;Returned demonstration             PT Long Term Goals - 04/29/16 1714    PT LONG TERM GOAL #1   Title Pt will be able to get ready in the morning and out of the house < 1 hr in order to demo IND with energy conservation and managed pain.   Time 12   Period Weeks   Status Achieved   PT LONG TERM  GOAL #2   Title Pt will decrease her PGQ score from 55% to < 45% in order to perform ADLs and work tasks. (5/2: 36%)    Time 12   Period Weeks   Status Achieved   PT LONG TERM GOAL #3   Title Pt will decrease her PFDI score from 49%    to < 39% to improve pelvic floor function and improve QOL. (5/2: 41%)    Time 12   Period Weeks   Status Partially Met   PT LONG TERM GOAL #4   Title Pt will report decrease pain at neck and shoulders from 5/10 to < 2/10 and demo decreased forward head (change in 19 cm acromium to auditory meatus B to < 17 cm)  in order to prepare for the morning to perform ADLs.    Time 12   Period Weeks   Status Achieved   PT LONG TERM GOAL #5   Title Pt will  report decreasing soda intake, increasing water to achieve a 1:1 ratio in order to decrease urge incontinence and SUI from 2x/ month to 1x/ month.  (5/4: SUI resolved, urge is 1 x / in 2 week)    Time 12   Period Weeks   Status Partially Met   PT LONG TERM GOAL #6   Title Pt will demo 1.5 fingers width below sternum in order to increase intraabdominal pressure for postural stability  when lifting and cleaning house.   Time 12   Period Weeks   Status Achieved               Plan - 04/29/16 1754    Clinical Impression Statement Pt's neck / back pain was resolved with traction (manual ) and movement routine. Pt achieved no pain with forward bend post Tx traction. Pt's forward head posture also improved. Pt continues to show increased strnegth. Pt was educated on relaxation tools to use after cleaning houses and pre-surgery (to decrease pt's anxiety).  Pt is close to achieving her goals.    Rehab Potential Good   Clinical Impairments Affecting Rehab Potential co-morbidities, smoker, fibromyalgia, and waiting for biopsy reports for breast lumps, occupation: house-cleaning w/ excessive lifting, vaccuming,and floor to stand     PT Frequency 1x / week   PT Duration 12 weeks   PT Treatment/Interventions ADLs/Self Care Home Management;Aquatic Therapy;Cryotherapy;Biofeedback;Moist Heat;Gait training;Neuromuscular re-education;Balance training;Therapeutic exercise;Therapeutic activities;Functional mobility training;Stair training;Patient/family education;Manual techniques;Taping;Energy conservation;Scar mobilization;Electrical Stimulation   Consulted and Agree with Plan of Care Patient      Patient will benefit from skilled therapeutic intervention in order to improve the following deficits and impairments:  Pain, Impaired sensation, Improper body mechanics, Postural dysfunction, Decreased mobility, Decreased coordination, Decreased endurance, Decreased range of motion, Decreased strength, Decreased balance, Difficulty walking, Decreased activity tolerance, Hypermobility  Visit Diagnosis: Unspecified lack of coordination  Muscle weakness (generalized)     Problem List Patient Active Problem List   Diagnosis Date Noted  . Anxiety 06/10/2014  . Airway hyperreactivity 06/10/2014  . Clinical depression 06/10/2014  . Gastroduodenal ulcer 06/10/2014  . Reflux 06/10/2014     Jerl Mina ,PT, DPT, E-RYT  04/29/2016, 6:16 PM  [place] MAIN [facility] SERVICES [address] [place], [place], [address] Phone: [phone]   Fax:  [phone]  Name: Kristy Shaw MRN: [id number] Date of Birth: 1976-02-27

## 2016-05-11 ENCOUNTER — Ambulatory Visit: Payer: BLUE CROSS/BLUE SHIELD | Admitting: Physical Therapy

## 2016-05-11 DIAGNOSIS — R279 Unspecified lack of coordination: Secondary | ICD-10-CM

## 2016-05-11 DIAGNOSIS — M6281 Muscle weakness (generalized): Secondary | ICD-10-CM

## 2016-05-11 NOTE — Patient Instructions (Signed)
Restorative practices:  Quiet, supported by pillows, blankets, eye pillow  10 mins , gradual transition into and out of:   1. Ankles to the side, leaning over pillow ramp     Pillow between knees, armpits , elbows    boths sides   2. Reclined butterfly on ramp ( knees are apart on pillows)     Neck roll with hand towel (sushi roll size)     3. Corpse pose  ( on your back )

## 2016-05-12 NOTE — Therapy (Signed)
[place] MAIN [facility] SERVICES [address] [place], [place], [address] Phone: [phone]   Fax:  [phone]  Physical Therapy Treatment  Patient Details  Name: Kristy Shaw MRN: [id number] Date of Birth: 1976-02-05 Referring Provider: Aundra Dubin   Encounter Date: 05/11/2016      PT End of Session - 05/12/16 2126    Visit Number 6   Number of Visits 12   Date for PT Re-Evaluation 06/17/16   PT Start Time 1340   PT Stop Time 1430   PT Time Calculation (min) 50 min   Activity Tolerance Patient tolerated treatment well;No increased pain   Behavior During Therapy [facility] for tasks assessed/performed      Past Medical History  Diagnosis Date  . Anxiety   . Asthma   . Bipolar disorder ([place])   . Mitral valve prolapse   . Depression   . Fatigue   . Restless leg syndrome   . Fibromyalgia   . Bronchitis due to chemical ([place])   . Allergy     seasonal  . Sleep apnea   . Sleep apnea     currently not using machine     Past Surgical History  Procedure Laterality Date  . Nasal septum surgery      There were no vitals filed for this visit.      Subjective Assessment - 05/11/16 1346    Subjective Pt reported she had a sinus infection last week and did not perform her HEP regularly. Pt reported she applied the bnreathing, body scan technique prior a gynceology procedure (endometrial biopsy)  which helped her to calm. Today, pt has some crampiness over the suprapubic  area since the procedure.    Patient Stated Goals  to be as pain free as possible without medication and would like to get off gabapentin for fibromyalgia.                           [place] Adult PT Treatment/Exercise - 05/12/16 2125    Therapeutic Activites    Other Therapeutic Activities restorative poses for relaxation   provided props, set up, and guided relaxation technique                PT Education - 05/12/16 2126    Education provided Yes    Education Details HEP   Person(s) Educated Patient   Methods Explanation;Demonstration;Tactile cues;Verbal cues;Handout   Comprehension Returned demonstration;Verbalized understanding             PT Long Term Goals - 05/12/16 2129    PT LONG TERM GOAL #1   Title Pt will be able to get ready in the morning and out of the house < 1 hr in order to demo IND with energy conservation and managed pain.   Time 12   Period Weeks   Status Achieved   PT LONG TERM GOAL #2   Title Pt will decrease her PGQ score from 55% to < 45% in order to perform ADLs and work tasks. (5/2: 36%)    Time 12   Period Weeks   Status Achieved   PT LONG TERM GOAL #3   Title Pt will decrease her PFDI score from 49%    to < 39% to improve pelvic floor function and improve QOL. (5/2: 41%)    Time 12   Period Weeks   Status Partially Met   PT LONG TERM GOAL #4   Title Pt will  report decrease pain at neck and shoulders from 5/10 to < 2/10 and demo decreased forward head (change in 19 cm acromium to auditory meatus B to < 17 cm)  in order to prepare for the morning to perform ADLs.    Time 12   Period Weeks   Status Achieved   PT LONG TERM GOAL #5   Title Pt will  report decreasing soda intake, increasing water to achieve a 1:1 ratio in order to decrease urge incontinence and SUI from 2x/ month to 1x/ month.  (5/4: SUI resolved, urge is 1 x / in 2 week)    Time 12   Period Weeks   Status Partially Met   Additional Long Term Goals   Additional Long Term Goals Yes   PT LONG TERM GOAL #6   Title Pt will demo 1.5 fingers width below sternum in order to increase intraabdominal pressure for postural stability when lifting and cleaning house.   Time 12   Period Weeks   Status Achieved   PT LONG TERM GOAL #7   Title Pt will demo IND with HEP in order to maintain the benefits of PT with utilization of restorative practices and aquatic exercises.    Time 12   Period Weeks   Status New               Plan  - 05/12/16 2127    Clinical Impression Statement Pt reported feeling very relaxed after learning restorative techniques. 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 Pt will continue to benefit skilled PT.    Rehab Potential Good   Clinical Impairments Affecting Rehab Potential co-morbidities, smoker, fibromyalgia, and waiting for biopsy reports for breast lumps, occupation: house-cleaning w/ excessive lifting, vaccuming,and floor to stand     PT Frequency 1x / week   PT Duration 12 weeks   PT Treatment/Interventions ADLs/Self Care Home Management;Aquatic Therapy;Cryotherapy;Biofeedback;Moist Heat;Gait training;Neuromuscular re-education;Balance training;Therapeutic exercise;Therapeutic activities;Functional mobility training;Stair training;Patient/family education;Manual techniques;Taping;Energy conservation;Scar mobilization;Electrical Stimulation   Consulted and Agree with Plan of Care Patient      Patient will benefit from skilled therapeutic intervention in order to improve the following deficits and impairments:  Pain, Impaired sensation, Improper body mechanics, Postural dysfunction, Decreased mobility, Decreased coordination, Decreased endurance, Decreased range of motion, Decreased strength, Decreased balance, Difficulty walking, Decreased activity tolerance, Hypermobility  Visit Diagnosis: Unspecified lack of coordination  Muscle weakness (generalized)     Problem List Patient Active Problem List   Diagnosis Date Noted  . Anxiety 06/10/2014  . Airway hyperreactivity 06/10/2014  . Clinical depression 06/10/2014  . Gastroduodenal ulcer 06/10/2014  . Reflux 06/10/2014    Jerl Mina ,PT, DPT, E-RYT  05/12/2016, 9:30 PM  [place]  MAIN [facility] SERVICES [address] [place], [place], [address] Phone: [phone]   Fax:  [phone]  Name: Kristy Shaw MRN: [id number] Date of Birth: 28-Nov-1976

## 2016-05-18 ENCOUNTER — Ambulatory Visit: Payer: BLUE CROSS/BLUE SHIELD | Admitting: Physical Therapy

## 2016-05-18 DIAGNOSIS — M6281 Muscle weakness (generalized): Secondary | ICD-10-CM

## 2016-05-18 DIAGNOSIS — R279 Unspecified lack of coordination: Secondary | ICD-10-CM | POA: Diagnosis not present

## 2016-05-18 NOTE — Therapy (Signed)
[place] MAIN [facility] SERVICES [address] [place], [place], [address] Phone: [phone]   Fax:  [phone]  Physical Therapy Treatment  Patient Details  Name: Kristy Shaw MRN: [id number] Date of Birth: 12-02-1976 Referring Provider: Aundra Dubin   Encounter Date: 05/18/2016      PT End of Session - 05/18/16 0824    Visit Number 7   Number of Visits 12   Date for PT Re-Evaluation 06/17/16   PT Start Time 0805   PT Stop Time 0845   PT Time Calculation (min) 40 min      Past Medical History  Diagnosis Date  . Anxiety   . Asthma   . Bipolar disorder ([place])   . Mitral valve prolapse   . Depression   . Fatigue   . Restless leg syndrome   . Fibromyalgia   . Bronchitis due to chemical ([place])   . Allergy     seasonal  . Sleep apnea   . Sleep apnea     currently not using machine     Past Surgical History  Procedure Laterality Date  . Nasal septum surgery      There were no vitals filed for this visit.      Subjective Assessment - 05/18/16 0823    Subjective Pt reported she took her antibiotics for her sinus infection.    Patient Stated Goals  to be as pain free as possible without medication and would like to get off gabapentin for fibromyalgia.                       Adult Aquatic Therapy - 05/18/16 0824    Aquatic Therapy Subjective   Subjective Pt had no compliants       O: Pt entered/exited the pool via steps with single UE support on rail.  50 ft =1 lap Exercises performed in 3'6" depth  2 laps walking  2 laps [place] L/R  In 4'6" depth 4 laps walking with dumbbells 4 laps walking with kickboard at waist height with elbow extended  2 laps with dumbbell sidestep/ squat   Relaxation on noodles, gently pulled by PT 8', pt floated on noodles 3'                   PT Long Term Goals - 05/12/16 2129    PT LONG TERM GOAL #1   Title Pt will be able to get ready in the morning and out of  the house < 1 hr in order to demo IND with energy conservation and managed pain.   Time 12   Period Weeks   Status Achieved   PT LONG TERM GOAL #2   Title Pt will decrease her PGQ score from 55% to < 45% in order to perform ADLs and work tasks. (5/2: 36%)    Time 12   Period Weeks   Status Achieved   PT LONG TERM GOAL #3   Title Pt will decrease her PFDI score from 49%    to < 39% to improve pelvic floor function and improve QOL. (5/2: 41%)    Time 12   Period Weeks   Status Partially Met   PT LONG TERM GOAL #4   Title Pt will report decrease pain at neck and shoulders from 5/10 to < 2/10 and demo decreased forward head (change in 19 cm acromium to auditory meatus B to < 17 cm)  in order to prepare for the morning  to perform ADLs.    Time 12   Period Weeks   Status Achieved   PT LONG TERM GOAL #5   Title Pt will  report decreasing soda intake, increasing water to achieve a 1:1 ratio in order to decrease urge incontinence and SUI from 2x/ month to 1x/ month.  (5/4: SUI resolved, urge is 1 x / in 2 week)    Time 12   Period Weeks   Status Partially Met   Additional Long Term Goals   Additional Long Term Goals Yes   PT LONG TERM GOAL #6   Title Pt will demo 1.5 fingers width below sternum in order to increase intraabdominal pressure for postural stability when lifting and cleaning house.   Time 12   Period Weeks   Status Achieved   PT LONG TERM GOAL #7   Title Pt will demo IND with HEP in order to maintain the benefits of PT with utilization of restorative practices and aquatic exercises.    Time 12   Period Weeks   Status New               Plan - 05/18/16 3013    Clinical Impression Statement Pt had no complaints with aquatic therapy. Pt was educated on how to increase intensity of exercises with props and use of shallow versus deep depths. Pt reported feeling very relaxed after the relaxation period. Pt was educated on exercising with short intervals and providing rest  periods to manage fibromyalgia and minimize fatigue. Pt voiced understanding. Pt is progressing well towards her goals.   Rehab Potential Good   Clinical Impairments Affecting Rehab Potential co-morbidities, smoker, fibromyalgia, and waiting for biopsy reports for breast lumps, occupation: house-cleaning w/ excessive lifting, vaccuming,and floor to stand     PT Frequency 1x / week   PT Duration 12 weeks   PT Treatment/Interventions ADLs/Self Care Home Management;Aquatic Therapy;Cryotherapy;Biofeedback;Moist Heat;Gait training;Neuromuscular re-education;Balance training;Therapeutic exercise;Therapeutic activities;Functional mobility training;Stair training;Patient/family education;Manual techniques;Taping;Energy conservation;Scar mobilization;Electrical Stimulation   Consulted and Agree with Plan of Care Patient      Patient will benefit from skilled therapeutic intervention in order to improve the following deficits and impairments:  Pain, Impaired sensation, Improper body mechanics, Postural dysfunction, Decreased mobility, Decreased coordination, Decreased endurance, Decreased range of motion, Decreased strength, Decreased balance, Difficulty walking, Decreased activity tolerance, Hypermobility  Visit Diagnosis: No diagnosis found.     Problem List Patient Active Problem List   Diagnosis Date Noted  . Anxiety 06/10/2014  . Airway hyperreactivity 06/10/2014  . Clinical depression 06/10/2014  . Gastroduodenal ulcer 06/10/2014  . Reflux 06/10/2014    Jerl Mina ,PT, DPT, E-RYT  05/18/2016, 8:25 AM  [place] MAIN [facility] SERVICES [address] [place], [place], [address] Phone: [phone]   Fax:  [phone]  Name: Kristy Shaw MRN: [id number] Date of Birth: [birth year]

## 2016-05-25 ENCOUNTER — Ambulatory Visit: Payer: BLUE CROSS/BLUE SHIELD | Attending: Internal Medicine | Admitting: Physical Therapy

## 2016-05-25 DIAGNOSIS — R279 Unspecified lack of coordination: Secondary | ICD-10-CM

## 2016-05-25 DIAGNOSIS — M6281 Muscle weakness (generalized): Secondary | ICD-10-CM | POA: Insufficient documentation

## 2016-05-26 NOTE — Therapy (Signed)
[place] MAIN [facility] SERVICES [address] [place], [place], [address] Phone: [phone]   Fax:  [phone]  Physical Therapy Treatment  Patient Details  Name: Kristy Shaw MRN: [id number] Date of Birth: Feb 01, 1976 Referring Provider: Aundra Dubin   Encounter Date: 05/25/2016      PT End of Session - 05/26/16 0928    Visit Number 8   Number of Visits 12   Date for PT Re-Evaluation 06/17/16   PT Start Time 0940   PT Stop Time 1030   PT Time Calculation (min) 50 min   Activity Tolerance Patient tolerated treatment well   Behavior During Therapy [facility] for tasks assessed/performed      Past Medical History  Diagnosis Date  . Anxiety   . Asthma   . Bipolar disorder ([place])   . Mitral valve prolapse   . Depression   . Fatigue   . Restless leg syndrome   . Fibromyalgia   . Bronchitis due to chemical ([place])   . Allergy     seasonal  . Sleep apnea   . Sleep apnea     currently not using machine     Past Surgical History  Procedure Laterality Date  . Nasal septum surgery      There were no vitals filed for this visit.      Subjective Assessment - 05/25/16 1107    Subjective Pt reported she will going to the beach next week. She plan on using the pool but will not get into the ocean. Her neighbor has opened their pool for her family to use it and pt feels excited about that.    Patient Stated Goals  to be as pain free as possible without medication and would like to get off gabapentin for fibromyalgia.                       Adult Aquatic Therapy - 05/26/16 0920    Aquatic Therapy Subjective   Subjective Pt had no compliants       O: Pt entered/exited the pool via stairs with UE support on rail.  50 ft =1 lap  Exercises performed in 3'6" depth  2 laps walking   Exercises in 4'6" depth 2 laps breast stroke (with cues for less lumbar lordosis with hip flexion)  2 laps walking with noodle behind back and pulling  down (for shoulder ext, depression, triceps)  2 laps walking with noodle in front, pulling and pushing down  2 laps hopping and swinging noodle under buttocks for abs and lengthening of spine 2 laps hopping w/ both legs on noodle  2 laps hopping w/ single leg on noodle  Play games with daughter: Simulated pulling game to work pulling across midline with stable legs, pelvis   Relaxation on noodles 10' (no charge)                      PT Long Term Goals - 05/12/16 2129    PT LONG TERM GOAL #1   Title Pt will be able to get ready in the morning and out of the house < 1 hr in order to demo IND with energy conservation and managed pain.   Time 12   Period Weeks   Status Achieved   PT LONG TERM GOAL #2   Title Pt will decrease her PGQ score from 55% to < 45% in order to perform ADLs and work tasks. (5/2: 36%)  Time 12   Period Weeks   Status Achieved   PT LONG TERM GOAL #3   Title Pt will decrease her PFDI score from 49%    to < 39% to improve pelvic floor function and improve QOL. (5/2: 41%)    Time 12   Period Weeks   Status Partially Met   PT LONG TERM GOAL #4   Title Pt will report decrease pain at neck and shoulders from 5/10 to < 2/10 and demo decreased forward head (change in 19 cm acromium to auditory meatus B to < 17 cm)  in order to prepare for the morning to perform ADLs.    Time 12   Period Weeks   Status Achieved   PT LONG TERM GOAL #5   Title Pt will  report decreasing soda intake, increasing water to achieve a 1:1 ratio in order to decrease urge incontinence and SUI from 2x/ month to 1x/ month.  (5/4: SUI resolved, urge is 1 x / in 2 week)    Time 12   Period Weeks   Status Partially Met   Additional Long Term Goals   Additional Long Term Goals Yes   PT LONG TERM GOAL #6   Title Pt will demo 1.5 fingers width below sternum in order to increase intraabdominal pressure for postural stability when lifting and cleaning house.   Time 12   Period Weeks    Status Achieved   PT LONG TERM GOAL #7   Title Pt will demo IND with HEP in order to maintain the benefits of PT with utilization of restorative practices and aquatic exercises.    Time 12   Period Weeks   Status New               Plan - 05/26/16 2951    Clinical Impression Statement Pt progressed well with new exercises for arm, abs strengthening with use of noodle in 4'6" depth of water. Pt was also shown ways to incorporate games with daughter and husband while getting a workout with rest breaks. Pt showed increased muscle formation of her back/ arm muscles. Pt showed awareness on how to modify exercises to decrease lumbar lordosis. Pt will [place] her [place]  PT session in 3 weeks following her vacation and surgery. Pt will be ready for d/c at that time.    Rehab Potential Good   Clinical Impairments Affecting Rehab Potential co-morbidities, smoker, fibromyalgia, and waiting for biopsy reports for breast lumps, occupation: house-cleaning w/ excessive lifting, vaccuming,and floor to stand     PT Frequency 1x / week   PT Duration 12 weeks   PT Treatment/Interventions ADLs/Self Care Home Management;Aquatic Therapy;Cryotherapy;Biofeedback;Moist Heat;Gait training;Neuromuscular re-education;Balance training;Therapeutic exercise;Therapeutic activities;Functional mobility training;Stair training;Patient/family education;Manual techniques;Taping;Energy conservation;Scar mobilization;Electrical Stimulation   Consulted and Agree with Plan of Care Patient      Patient will benefit from skilled therapeutic intervention in order to improve the following deficits and impairments:  Pain, Impaired sensation, Improper body mechanics, Postural dysfunction, Decreased mobility, Decreased coordination, Decreased endurance, Decreased range of motion, Decreased strength, Decreased balance, Difficulty walking, Decreased activity tolerance, Hypermobility  Visit Diagnosis: Unspecified lack of  coordination  Muscle weakness (generalized)     Problem List Patient Active Problem List   Diagnosis Date Noted  . Anxiety 06/10/2014  . Airway hyperreactivity 06/10/2014  . Clinical depression 06/10/2014  . Gastroduodenal ulcer 06/10/2014  . Reflux 06/10/2014    Jerl Mina ,PT, DPT, E-RYT  05/26/2016, 9:29 AM  [place]  [place] MAIN [facility] SERVICES [place], [place], [address] Phone: [phone]   Fax:  [phone]  Name: Kristy Shaw MRN: [id number] Date of Birth: 25-Feb-1976

## 2016-05-31 ENCOUNTER — Encounter (HOSPITAL_BASED_OUTPATIENT_CLINIC_OR_DEPARTMENT_OTHER): Payer: Self-pay | Admitting: *Deleted

## 2016-05-31 ENCOUNTER — Ambulatory Visit (INDEPENDENT_AMBULATORY_CARE_PROVIDER_SITE_OTHER): Payer: BLUE CROSS/BLUE SHIELD | Admitting: Licensed Clinical Social Worker

## 2016-05-31 DIAGNOSIS — F331 Major depressive disorder, recurrent, moderate: Secondary | ICD-10-CM

## 2016-05-31 NOTE — Pre-Procedure Instructions (Signed)
Pt in for pre op interview, boost breeze given to her and explained at this time.

## 2016-06-01 ENCOUNTER — Ambulatory Visit: Payer: BLUE CROSS/BLUE SHIELD | Admitting: Licensed Clinical Social Worker

## 2016-06-07 ENCOUNTER — Ambulatory Visit (HOSPITAL_BASED_OUTPATIENT_CLINIC_OR_DEPARTMENT_OTHER): Payer: BLUE CROSS/BLUE SHIELD | Admitting: Anesthesiology

## 2016-06-07 ENCOUNTER — Ambulatory Visit
Admission: RE | Admit: 2016-06-07 | Discharge: 2016-06-07 | Disposition: A | Discharge status: Home / Self Care | Payer: BLUE CROSS/BLUE SHIELD | Source: Ambulatory Visit | Attending: General Surgery | Admitting: General Surgery

## 2016-06-07 ENCOUNTER — Ambulatory Visit (HOSPITAL_BASED_OUTPATIENT_CLINIC_OR_DEPARTMENT_OTHER)
Admission: RE | Admit: 2016-06-07 | Discharge: 2016-06-07 | Disposition: A | Discharge status: Home / Self Care | Payer: BLUE CROSS/BLUE SHIELD | Source: Ambulatory Visit | Attending: General Surgery | Admitting: General Surgery

## 2016-06-07 ENCOUNTER — Encounter (HOSPITAL_BASED_OUTPATIENT_CLINIC_OR_DEPARTMENT_OTHER)
Admission: RE | Disposition: A | Discharge status: Home / Self Care | Payer: Self-pay | Source: Ambulatory Visit | Attending: General Surgery

## 2016-06-07 ENCOUNTER — Encounter (HOSPITAL_BASED_OUTPATIENT_CLINIC_OR_DEPARTMENT_OTHER): Payer: Self-pay | Admitting: Anesthesiology

## 2016-06-07 DIAGNOSIS — F17200 Nicotine dependence, unspecified, uncomplicated: Secondary | ICD-10-CM | POA: Insufficient documentation

## 2016-06-07 DIAGNOSIS — N6022 Fibroadenosis of left breast: Secondary | ICD-10-CM | POA: Insufficient documentation

## 2016-06-07 DIAGNOSIS — J45909 Unspecified asthma, uncomplicated: Secondary | ICD-10-CM | POA: Insufficient documentation

## 2016-06-07 DIAGNOSIS — G4730 Sleep apnea, unspecified: Secondary | ICD-10-CM | POA: Insufficient documentation

## 2016-06-07 DIAGNOSIS — M797 Fibromyalgia: Secondary | ICD-10-CM | POA: Insufficient documentation

## 2016-06-07 DIAGNOSIS — F319 Bipolar disorder, unspecified: Secondary | ICD-10-CM | POA: Insufficient documentation

## 2016-06-07 HISTORY — PX: BREAST LUMPECTOMY: SHX2

## 2016-06-07 HISTORY — PX: BREAST LUMPECTOMY WITH RADIOACTIVE SEED LOCALIZATION: SHX6424

## 2016-06-07 SURGERY — BREAST LUMPECTOMY WITH RADIOACTIVE SEED LOCALIZATION
Anesthesia: General | Site: Breast | Laterality: Left

## 2016-06-07 MED ORDER — CEFAZOLIN SODIUM-DEXTROSE 2-4 GM/100ML-% IV SOLN
2.0000 g | INTRAVENOUS | Status: AC
  Administered 2016-06-07: 2 g via INTRAVENOUS

## 2016-06-07 MED ORDER — SCOPOLAMINE 1 MG/3DAYS TD PT72: 1.0000 | MEDICATED_PATCH | Freq: Once | TRANSDERMAL | Status: DC | PRN

## 2016-06-07 MED ORDER — MIDAZOLAM HCL 2 MG/2ML IJ SOLN: 0.5000 mg | Freq: Once | INTRAMUSCULAR | Status: DC | PRN

## 2016-06-07 MED ORDER — MEPERIDINE HCL 25 MG/ML IJ SOLN: 6.2500 mg | INTRAMUSCULAR | Status: DC | PRN

## 2016-06-07 MED ORDER — GLYCOPYRROLATE 0.2 MG/ML IJ SOLN
0.2000 mg | Freq: Once | INTRAMUSCULAR | Status: DC | PRN
Start: 2016-06-07 — End: 2016-06-07

## 2016-06-07 MED ORDER — FENTANYL CITRATE (PF) 100 MCG/2ML IJ SOLN
INTRAMUSCULAR | Status: AC
  Filled 2016-06-07: qty 2

## 2016-06-07 MED ORDER — FENTANYL CITRATE (PF) 100 MCG/2ML IJ SOLN: 25.0000 ug | INTRAMUSCULAR | Status: DC | PRN

## 2016-06-07 MED ORDER — CEFAZOLIN SODIUM-DEXTROSE 2-4 GM/100ML-% IV SOLN
INTRAVENOUS | Status: AC
  Filled 2016-06-07: qty 100

## 2016-06-07 MED ORDER — BUPIVACAINE-EPINEPHRINE (PF) 0.25% -1:200000 IJ SOLN
INTRAMUSCULAR | Status: DC | PRN
  Administered 2016-06-07: 20 mL

## 2016-06-07 MED ORDER — PROMETHAZINE HCL 25 MG/ML IJ SOLN
6.2500 mg | INTRAMUSCULAR | Status: DC | PRN
Start: 2016-06-07 — End: 2016-06-07

## 2016-06-07 MED ORDER — PROPOFOL 10 MG/ML IV BOLUS
INTRAVENOUS | Status: AC
  Filled 2016-06-07: qty 20

## 2016-06-07 MED ORDER — MIDAZOLAM HCL 2 MG/2ML IJ SOLN
1.0000 mg | INTRAMUSCULAR | Status: DC | PRN
  Administered 2016-06-07: 2 mg via INTRAVENOUS

## 2016-06-07 MED ORDER — FENTANYL CITRATE (PF) 100 MCG/2ML IJ SOLN
50.0000 ug | INTRAMUSCULAR | Status: DC | PRN
  Administered 2016-06-07: 100 ug via INTRAVENOUS

## 2016-06-07 MED ORDER — MIDAZOLAM HCL 2 MG/2ML IJ SOLN
INTRAMUSCULAR | Status: AC
  Filled 2016-06-07: qty 2

## 2016-06-07 MED ORDER — CHLORHEXIDINE GLUCONATE 4 % EX LIQD: 1.0000 "application " | Freq: Once | CUTANEOUS | Status: DC

## 2016-06-07 MED ORDER — LIDOCAINE HCL (CARDIAC) 20 MG/ML IV SOLN
INTRAVENOUS | Status: DC | PRN
  Administered 2016-06-07: 30 mg via INTRAVENOUS

## 2016-06-07 MED ORDER — LACTATED RINGERS IV SOLN
INTRAVENOUS | Status: DC
  Administered 2016-06-07 (×3): via INTRAVENOUS

## 2016-06-07 MED ORDER — EPHEDRINE SULFATE 50 MG/ML IJ SOLN
INTRAMUSCULAR | Status: DC | PRN
  Administered 2016-06-07: 10 mg via INTRAVENOUS

## 2016-06-07 MED ORDER — DEXAMETHASONE SODIUM PHOSPHATE 4 MG/ML IJ SOLN
INTRAMUSCULAR | Status: DC | PRN
  Administered 2016-06-07: 10 mg via INTRAVENOUS

## 2016-06-07 MED ORDER — OXYCODONE-ACETAMINOPHEN 5-325 MG PO TABS: 1.0000 | ORAL_TABLET | ORAL | Status: DC | PRN

## 2016-06-07 MED ORDER — ONDANSETRON HCL 4 MG/2ML IJ SOLN
INTRAMUSCULAR | Status: DC | PRN
  Administered 2016-06-07: 4 mg via INTRAVENOUS

## 2016-06-07 SURGICAL SUPPLY — 44 items
APPLIER CLIP 9.375 MED OPEN (MISCELLANEOUS)
BLADE SURG 15 STRL LF DISP TIS (BLADE) ×1 IMPLANT
BLADE SURG 15 STRL SS (BLADE) ×2
CANISTER SUC SOCK COL 7IN (MISCELLANEOUS) IMPLANT
CANISTER SUCT 1200ML W/VALVE (MISCELLANEOUS) IMPLANT
CHLORAPREP W/TINT 26ML (MISCELLANEOUS) ×3 IMPLANT
CLIP APPLIE 9.375 MED OPEN (MISCELLANEOUS) IMPLANT
COVER BACK TABLE 60X90IN (DRAPES) ×3 IMPLANT
COVER MAYO STAND STRL (DRAPES) ×3 IMPLANT
COVER PROBE W GEL 5X96 (DRAPES) ×3 IMPLANT
DECANTER SPIKE VIAL GLASS SM (MISCELLANEOUS) IMPLANT
DEVICE DUBIN W/COMP PLATE 8390 (MISCELLANEOUS) ×3 IMPLANT
DRAPE LAPAROSCOPIC ABDOMINAL (DRAPES) ×3 IMPLANT
DRAPE UTILITY XL STRL (DRAPES) ×3 IMPLANT
ELECT COATED BLADE 2.86 ST (ELECTRODE) ×3 IMPLANT
ELECT REM PT RETURN 9FT ADLT (ELECTROSURGICAL) ×3
ELECTRODE REM PT RTRN 9FT ADLT (ELECTROSURGICAL) ×1 IMPLANT
GLOVE BIO SURGEON STRL SZ7.5 (GLOVE) ×6 IMPLANT
GLOVE BIOGEL PI IND STRL 7.0 (GLOVE) ×1 IMPLANT
GLOVE BIOGEL PI IND STRL 8 (GLOVE) ×1 IMPLANT
GLOVE BIOGEL PI INDICATOR 7.0 (GLOVE) ×2
GLOVE BIOGEL PI INDICATOR 8 (GLOVE) ×2
GLOVE SURG SS PI 8.0 STRL IVOR (GLOVE) ×3 IMPLANT
GOWN STRL REUS W/ TWL LRG LVL3 (GOWN DISPOSABLE) ×2 IMPLANT
GOWN STRL REUS W/TWL LRG LVL3 (GOWN DISPOSABLE) ×4
ILLUMINATOR WAVEGUIDE N/F (MISCELLANEOUS) IMPLANT
KIT MARKER MARGIN INK (KITS) ×3 IMPLANT
LIGHT WAVEGUIDE WIDE FLAT (MISCELLANEOUS) IMPLANT
LIQUID BAND (GAUZE/BANDAGES/DRESSINGS) ×3 IMPLANT
NEEDLE HYPO 25X1 1.5 SAFETY (NEEDLE) ×3 IMPLANT
NS IRRIG 1000ML POUR BTL (IV SOLUTION) ×3 IMPLANT
PACK BASIN DAY SURGERY FS (CUSTOM PROCEDURE TRAY) ×3 IMPLANT
PENCIL BUTTON HOLSTER BLD 10FT (ELECTRODE) ×3 IMPLANT
SLEEVE SCD COMPRESS KNEE MED (MISCELLANEOUS) ×3 IMPLANT
SPONGE LAP 18X18 X RAY DECT (DISPOSABLE) ×3 IMPLANT
SUT MON AB 4-0 PC3 18 (SUTURE) ×3 IMPLANT
SUT SILK 2 0 SH (SUTURE) IMPLANT
SUT VICRYL 3-0 CR8 SH (SUTURE) ×3 IMPLANT
SYR CONTROL 10ML LL (SYRINGE) ×3 IMPLANT
TOWEL OR 17X24 6PK STRL BLUE (TOWEL DISPOSABLE) ×3 IMPLANT
TOWEL OR NON WOVEN STRL DISP B (DISPOSABLE) IMPLANT
TUBE CONNECTING 20'X1/4 (TUBING)
TUBE CONNECTING 20X1/4 (TUBING) IMPLANT
YANKAUER SUCT BULB TIP NO VENT (SUCTIONS) IMPLANT

## 2016-06-07 NOTE — H&P (Signed)
Kristy HouseholderAlison M. Shaw  Location: [facility] Patient #: [id number] DOB: 03/28/1976 Married / Language: English / Race: White Female   History of Present Illness  The patient is a 40 year old female who presents with a breast mass. We are asked to see the patient in consultation by Dr. Gerome Samavid Williams to evaluate her for a left breast complex sclerosing lesion. The patient is a 40 year old white female who recently went for a routine screening mammogram. At that time she was found to have an area of architectural distortion in the upper inner left breast. She denies any breast pain or discharge from the nipple. There were actually 2 areas of distortion right next to each other. 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 She does smoke just under a pack of cigarettes a day. She does have a family history significant for breast cancer in both her maternal and paternal grandmothers.   Other Problems  Anxiety Disorder Asthma Depression Gastric Ulcer Gastroesophageal Reflux Disease Heart murmur Hemorrhoids Sleep Apnea  Past Surgical History  Breast Biopsy Left. Oral Surgery  Diagnostic Studies History Colonoscopy >10 years ago Mammogram 1-3 years ago Pap Smear 1-5 years ago  Allergies  Doxycycline *DERMATOLOGICALS* Rash. Adhesive Tape 1"x5yd *MEDICAL DEVICES AND SUPPLIES*  Medication History  ALPRAZolam (0.5MG  Tablet, Oral) Active. Gabapentin (400MG  Capsule, Oral) Active. Venlafaxine HCl ER (75MG  Capsule ER 24HR, Oral) Active. Albuterol Sulfate HFA (108MCG/ACT Aerosol Soln, Inhalation as needed) Active. Medications Reconciled  Social History  Alcohol use Occasional alcohol use. Caffeine use Carbonated beverages, Tea. Illicit drug use Prefer to discuss with provider. Tobacco use Current every day smoker.  Family History  Breast Cancer Family Members  In General. Cerebrovascular Accident Family Members In General, Father, Mother. Colon Polyps Father. Depression Sister. Hypertension Mother. Kidney Disease Brother.  Pregnancy / Birth History  Age at menarche 11 years. Gravida 3 Irregular periods Maternal age 41-30 Para 1    Review of Systems  General Present- Fatigue. Not Present- Appetite Loss, Chills, Fever, Night Sweats, Weight Gain and Weight Loss. Skin Present- Dryness. Not Present- Change in Wart/Mole, Hives, Jaundice, New Lesions, Non-Healing Wounds, Rash and Ulcer. HEENT Present- Earache, Hoarseness, Seasonal Allergies and Wears glasses/contact lenses. Not Present- Hearing Loss, Nose Bleed, Oral Ulcers, Ringing in the Ears, Sinus Pain, Sore Throat, Visual Disturbances and Yellow Eyes. Respiratory Present- Chronic Cough and Wheezing. Not Present- Bloody sputum, Difficulty Breathing and Snoring. Breast Not Present- Breast Mass, Breast Pain, Nipple Discharge and Skin Changes. Cardiovascular Not Present- Chest Pain, Difficulty Breathing Lying Down, Leg Cramps, Palpitations, Rapid Heart Rate, Shortness of Breath and Swelling of Extremities. Gastrointestinal Present- Hemorrhoids. Not Present- Abdominal Pain, Bloating, Bloody Stool, Change in Bowel Habits, Chronic diarrhea, Constipation, Difficulty Swallowing, Excessive gas, Gets full quickly at meals, Indigestion, Nausea, Rectal Pain and Vomiting. Female Genitourinary Present- Frequency and Urgency. Not Present- Nocturia, Painful Urination and Pelvic Pain. Musculoskeletal Present- Back Pain, Joint Pain and Muscle Pain. Not Present- Joint Stiffness, Muscle Weakness and Swelling of Extremities. Neurological Not Present- Decreased Memory, Fainting, Headaches, Numbness, Seizures, Tingling, Tremor, Trouble walking and Weakness. Psychiatric Present- Anxiety and Depression. Not Present- Bipolar, Change in Sleep Pattern, Fearful and Frequent crying. Endocrine Present- Cold  Intolerance. Not Present- Excessive Hunger, Hair Changes, Heat Intolerance, Hot flashes and New Diabetes. Hematology Not Present- Easy Bruising, Excessive bleeding, Gland problems, HIV and Persistent Infections.  Vitals  Weight: 128 lb Height: 70.5in Body Surface Area: 1.74 m  Body Mass Index: 18.11 kg/m  Temp.: 28F  Pulse: 90 (Regular)  BP: 128/78 (Sitting, Left Arm, Standard)       Physical Exam General Mental Status-Alert. General Appearance-Consistent with stated age. Hydration-Well hydrated. Voice-Normal.  Head and Neck Head-normocephalic, atraumatic with no lesions or palpable masses. Trachea-midline. Thyroid Gland Characteristics - normal size and consistency.  Eye Eyeball - Bilateral-Extraocular movements intact. Sclera/Conjunctiva - Bilateral-No scleral icterus.  Chest and Lung Exam Chest and lung exam reveals -quiet, even and easy respiratory effort with no use of accessory muscles and on auscultation, normal breath sounds, no adventitious sounds and normal vocal resonance. Inspection Chest Wall - Normal. Back - normal.  Breast Note: There is a palpable bruising in the upper inner left breast. Other than this there is no palpable mass in either breast. She does have small but dense breast tissue bilaterally. There is no palpable axillary, supraclavicular, or cervical lymphadenopathy.   Cardiovascular Cardiovascular examination reveals -normal heart sounds, regular rate and rhythm with no murmurs and normal pedal pulses bilaterally.  Abdomen Inspection Inspection of the abdomen reveals - No Hernias. Skin - Scar - no surgical scars. Palpation/Percussion Palpation and Percussion of the abdomen reveal - Soft, Non Tender, No Rebound tenderness, No Rigidity (guarding) and No hepatosplenomegaly. Auscultation Auscultation of the abdomen reveals - Bowel sounds normal.  Neurologic Neurologic evaluation reveals -alert and  oriented x 3 with no impairment of recent or remote memory. Mental Status-Normal.  Musculoskeletal Normal Exam - Left-Upper Extremity Strength Normal and Lower Extremity Strength Normal. Normal Exam - Right-Upper Extremity Strength Normal and Lower Extremity Strength Normal.  Lymphatic Head & Neck  General Head & Neck Lymphatics: Bilateral - Description - Normal. Axillary  General Axillary Region: Bilateral - Description - Normal. Tenderness - Non Tender. Femoral & Inguinal  Generalized Femoral & Inguinal Lymphatics: Bilateral - Description - Normal. Tenderness - Non Tender.    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 Because of there are abnormal appearance and because they are considered high risk lesions the recommendation is to have this area removed. I have discussed with her in detail the risks and benefits of the operation to do this as well as some of the technical aspects and she understands and wishes to proceed. I will plan for a left breast radioactive seed localized lumpectomy Current Plans Pt Education - Breast Diseases: discussed with patient and provided information.

## 2016-06-07 NOTE — Transfer of Care (Signed)
Immediate Anesthesia Transfer of Care Note  Patient: Kristy Shaw  Procedure(s) Performed: Procedure(s): LEFT BREAST LUMPECTOMY WITH RADIOACTIVE SEED LOCALIZATION (Left)  Patient Location: PACU  Anesthesia Type:General  Level of Consciousness: sedated  Airway & Oxygen Therapy: Patient Spontanous Breathing and Patient connected to face mask oxygen  Post-op Assessment: Report given to RN and Post -op Vital signs reviewed and stable  Post vital signs: Reviewed and stable  Last Vitals:  Filed Vitals:   06/07/16 1113 06/07/16 1114  BP:    Pulse: 76 72  Temp:    Resp:  15    Last Pain: There were no vitals filed for this visit.       Complications: No apparent anesthesia complications

## 2016-06-07 NOTE — Interval H&P Note (Signed)
History and Physical Interval Note:  06/07/2016 9:56 AM  Kristy Shaw  has presented today for surgery, with the diagnosis of left breast complex sclerosing lesion  The various methods of treatment have been discussed with the patient and family. After consideration of risks, benefits and other options for treatment, the patient has consented to  Procedure(s): LEFT BREAST LUMPECTOMY WITH RADIOACTIVE SEED LOCALIZATION (Left) as a surgical intervention .  The patient's history has been reviewed, patient examined, no change in status, stable for surgery.  I have reviewed the patient's chart and labs.  Questions were answered to the patient's satisfaction.     TOTH III,PAUL S

## 2016-06-07 NOTE — Progress Notes (Signed)
   THERAPIST PROGRESS NOTE  Session Time: 3160  Participation Level: Active  Behavioral Response: NeatAlertDepressed  Type of Therapy: Individual Therapy  Treatment Goals addressed: Diagnosis: Depression  Interventions: CBT, Motivational Interviewing, Solution Focused, Strength-based, Supportive, Family Systems and Reframing  Summary: Kristy Shaw is a 40 y.o. female who presents with continued symptoms of her diagnosis.  LCSW discussed what psychotherapy is and is not and the importance of the therapeutic relationship to include open and honest communication between client and therapist and building trust.  Reviewed advantages and disadvantages of the therapeutic process and limitations to the therapeutic relationship including LCSW's role in maintaining the safety of the client, others and those in client's care.  Discussion of her ability to cope with her current stressors such as: babysitting her niece, health concerns & finances. Discussion of her upcoming vacation and coping skills due to upcoming surgery    Suicidal/Homicidal: Nowithout intent/plan  Therapist Response: LCSW provided Patient with ongoing emotional support and encouragement.  Normalized her feelings.  Commended Patient on her progress and reinforced the importance of client staying focused on her own strengths and resources and resiliency. Processed various strategies for dealing with stressors.    Plan: Return again in 2weeks.  Diagnosis: Axis I: Depression    Axis II: No diagnosis    Marinda Elkicole M Peacock, LCSW 05/31/2016

## 2016-06-07 NOTE — Op Note (Signed)
06/07/2016  11:03 AM  PATIENT:  Kristy Shaw  40 y.o. female  PRE-OPERATIVE DIAGNOSIS:  left breast complex sclerosing lesion  POST-OPERATIVE DIAGNOSIS:  left breast complex sclerosing lesion  PROCEDURE:  Procedure(s): LEFT BREAST LUMPECTOMY WITH RADIOACTIVE SEED LOCALIZATION (Left)  SURGEON:  Surgeon(s) and Role:    * Griselda MinerPaul Toth III, MD - Primary  PHYSICIAN ASSISTANT:   ASSISTANTS: none   ANESTHESIA:   general  EBL:  Total I/O In: 600 [I.V.:600] Out: -   BLOOD ADMINISTERED:none  DRAINS: none   LOCAL MEDICATIONS USED:  MARCAINE     SPECIMEN:  Source of Specimen:  left breast tissue  DISPOSITION OF SPECIMEN:  PATHOLOGY  COUNTS:  YES  TOURNIQUET:  * No tourniquets in log *  DICTATION: .Dragon Dictation   After informed consent was obtained the patient was brought to the operating room and placed in the supine position on the operating room table. After adequate induction of general anesthesia the patient's left breast was prepped with ChloraPrep, allowed to dry, and draped in usual sterile manner. An appropriate timeout was performed. Previously an I-125 seed was placed in the upper inner quadrant of the left breast to mark an area of a complex sclerosing lesion. The neoprobe was set to I-125 in the area of radioactivity was readily identified. An elliptical incision was made overlying the area of radioactivity with a 15 blade knife. The incision was then carried through the skin and subcutaneous tissue sharply with electrocautery. While checking the area of radioactivity frequently with the neoprobe a circular portion of breast tissue was excised sharply around the radioactive seed. Her breast tissue was so thin that the skin overlying the seed was taken and the dissection was carried all the way to the chest wall.  Once the specimen was removed it was oriented with the appropriate paint colors. A specimen radiograph was obtained that showed the seed and 2 clips to be in  the center of the specimen. The specimen was then sent to pathology for further evaluation. Hemostasis was achieved using the Bovie electrocautery. The wound was then infiltrated with quarter percent Marcaine and irrigated with saline. The deep layer of the wound was sed with interrupted 3-0 Vicryl stitches. Skin was then closed with interrupted 4-0 Monocryl subcuticular stitches. Dermabond dressings were applied. The patient tolerated procedure well. At the end of the case all needle sponge and instrument counts were correct. The patient was then awakened and taken to recovery in stable condition.  PLAN OF CARE: Discharge to home after PACU  PATIENT DISPOSITION:  PACU - hemodynamically stable.   Delay start of Pharmacological VTE agent (>24hrs) due to surgical blood loss or risk of bleeding: not applicable

## 2016-06-07 NOTE — Anesthesia Procedure Notes (Signed)
Procedure Name: LMA Insertion Date/Time: 06/07/2016 10:21 AM Performed by: Zenia ResidesPAYNE, Levada Bowersox D Pre-anesthesia Checklist: Patient identified, Emergency Drugs available, Suction available and Patient being monitored Patient Re-evaluated:Patient Re-evaluated prior to inductionOxygen Delivery Method: Circle system utilized Preoxygenation: Pre-oxygenation with 100% oxygen Intubation Type: IV induction Ventilation: Mask ventilation without difficulty LMA: LMA inserted LMA Size: 3.0 Number of attempts: 1 Airway Equipment and Method: Bite block Placement Confirmation: positive ETCO2 Tube secured with: Tape Dental Injury: Teeth and Oropharynx as per pre-operative assessment

## 2016-06-07 NOTE — Anesthesia Preprocedure Evaluation (Addendum)
Anesthesia Evaluation  Patient identified by MRN, date of birth, ID band Patient awake    Reviewed: Allergy & Precautions, NPO status , Patient's Chart, lab work & pertinent test results  History of Anesthesia Complications Negative for: history of anesthetic complications  Airway Mallampati: I  TM Distance: >3 FB Neck ROM: Full    Dental  (+) Dental Advisory Given   Pulmonary asthma , sleep apnea and Continuous Positive Airway Pressure Ventilation , Current Smoker,    breath sounds clear to auscultation       Cardiovascular negative cardio ROS  + Valvular Problems/Murmurs (no MR) MVP  Rhythm:Regular Rate:Normal     Neuro/Psych Anxiety Depression Bipolar Disorder negative neurological ROS     GI/Hepatic negative GI ROS, Neg liver ROS,   Endo/Other  negative endocrine ROS  Renal/GU negative Renal ROS     Musculoskeletal  (+) Fibromyalgia -  Abdominal   Peds  Hematology negative hematology ROS (+)   Anesthesia Other Findings   Reproductive/Obstetrics LMP 05/27/16                          Anesthesia Physical Anesthesia Plan  ASA: III  Anesthesia Plan: General   Post-op Pain Management:    Induction: Intravenous  Airway Management Planned: LMA  Additional Equipment:   Intra-op Plan:   Post-operative Plan:   Informed Consent: I have reviewed the patients History and Physical, chart, labs and discussed the procedure including the risks, benefits and alternatives for the proposed anesthesia with the patient or authorized representative who has indicated his/her understanding and acceptance.   Dental advisory given  Plan Discussed with: CRNA and Surgeon  Anesthesia Plan Comments: (Plan routine monitors, GA- LMA OK)        Anesthesia Quick Evaluation

## 2016-06-07 NOTE — Discharge Instructions (Signed)

## 2016-06-08 ENCOUNTER — Encounter (HOSPITAL_BASED_OUTPATIENT_CLINIC_OR_DEPARTMENT_OTHER): Payer: Self-pay | Admitting: General Surgery

## 2016-06-08 ENCOUNTER — Encounter: Payer: BLUE CROSS/BLUE SHIELD | Admitting: Physical Therapy

## 2016-06-08 NOTE — Anesthesia Postprocedure Evaluation (Signed)
Anesthesia Post Note  Patient: Kristy Shaw  Procedure(s) Performed: Procedure(s) (LRB): LEFT BREAST LUMPECTOMY WITH RADIOACTIVE SEED LOCALIZATION (Left)  Patient location during evaluation: PACU Anesthesia Type: General Level of consciousness: awake and alert, oriented and patient cooperative Pain management: pain level controlled Vital Signs Assessment: post-procedure vital signs reviewed and stable Respiratory status: spontaneous breathing, nonlabored ventilation and respiratory function stable Cardiovascular status: blood pressure returned to baseline and stable Postop Assessment: no signs of nausea or vomiting Anesthetic complications: no Comments: Delayed entry, pt eval in PACU    Last Vitals:  Filed Vitals:   06/07/16 1215 06/07/16 1254  BP: 111/77 119/45  Pulse: 75 64  Temp:  36.6 C  Resp: 14 16    Last Pain:  Filed Vitals:   06/08/16 1019  PainSc: 1                  Yara Tomkinson,E. Sherlonda Flater

## 2016-06-18 ENCOUNTER — Encounter: Payer: Self-pay | Admitting: Psychiatry

## 2016-06-18 ENCOUNTER — Ambulatory Visit (INDEPENDENT_AMBULATORY_CARE_PROVIDER_SITE_OTHER): Payer: BLUE CROSS/BLUE SHIELD | Admitting: Psychiatry

## 2016-06-18 VITALS — BP 110/84 | HR 81 | Temp 98.3°F | Ht 71.0 in | Wt 130.2 lb

## 2016-06-18 DIAGNOSIS — F331 Major depressive disorder, recurrent, moderate: Secondary | ICD-10-CM | POA: Diagnosis not present

## 2016-06-18 MED ORDER — GABAPENTIN 400 MG PO CAPS: 400.0000 mg | ORAL_CAPSULE | Freq: Two times a day (BID) | ORAL | Status: DC

## 2016-06-18 MED ORDER — VENLAFAXINE HCL ER 75 MG PO CP24: 75.0000 mg | ORAL_CAPSULE | Freq: Every day | ORAL | Status: DC

## 2016-06-18 NOTE — Progress Notes (Signed)
BH MD/PA/NP OP Progress Note  06/18/2016 10:15 AM Kristy Shaw  MRN:  [id number]  Subjective:  Patient is a 40 year old female who was previously following Dr. Mayford KnifeWilliams. She presented for the follow-up appointment. She reported that she came back from a vacation where she went with her sister and her 3 kids. She reported that [facility]. She recently also had a lumpectomy done and is recovering from the same. She reported that she is currently experiences pain in her left breast. She is feeling uncomfortable while sleeping at night. She is not using her sleep apnea machine on a regular basis. Patient reported that she has been compliant with her medications and has switched to Effexor and is taking it in the morning. Patient currently denied having any suicidal ideations or plans. She appeared calm and alert during the interview. Patient reported that the medications are helping her. She has been compliant with her therapy sessions as well. She was alert and oriented during the interview.Patient stated that she also takes Neurontin 800 mg due to fibromyalgia and body ache. She has been doing physical therapy on a regular basis. Patient currently denied having any more swings anger anxiety or paranoia.   She  follows with her primary care physician on a regular basis. Patient reported that she does not have any problems with thyroid and she has followed up with her primary care on a regular basis. She feels tired during the daytime.   Chief Complaint: Chief Complaint    Follow-up; Medication Refill     Visit Diagnosis:     ICD-9-CM ICD-10-CM   1. Major depressive disorder, recurrent episode, moderate (HCC) 296.32 F33.1     Past Medical History:  Past Medical History  Diagnosis Date  . Anxiety   . Asthma   . Bipolar disorder (HCC)   . Mitral valve prolapse   . Depression   . Fatigue   . Restless leg syndrome   . Fibromyalgia   . 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 Allergy     seasonal  . Sleep apnea     Past Surgical History  Procedure Laterality Date  . Nasal septum surgery    . Dilation and curettage of uterus    . Breast lumpectomy with radioactive seed localization Left 06/07/2016    Procedure: LEFT BREAST LUMPECTOMY WITH RADIOACTIVE SEED LOCALIZATION;  Surgeon: Chevis PrettyPaul Toth III, MD;  Location: [facility];  Service: General;  Laterality: Left;   Family History:  Family History  Problem Relation Age of Onset  . Hypertension Mother   . Stroke Mother   . Heart attack Father   . Stroke Father   . Depression Father   . Depression Sister   . Kidney disease Brother   . ADD / ADHD Brother   . Asthma Brother   . Breast cancer Maternal Grandmother 2160   Social History:  Social History   Social History  . Marital Status: Married    Spouse Name: N/A  . Number of Children: N/A  . Years of Education: N/A   Social History Main Topics  . Smoking status: Current Every Day Smoker -- 0.50 packs/day    Types: Cigarettes    Start date: 09/23/1990  . Smokeless tobacco: Never Used  . Alcohol Use: No  . Drug Use: No  . Sexual Activity: Yes    Birth Control/ Protection: None   Other Topics Concern  . None  Social History Narrative   Additional History:   Assessment:   Musculoskeletal: Strength & Muscle Tone: within normal limits Gait & Station: normal Patient leans: N/A  Psychiatric Specialty Exam: HPI  Review of Systems  Psychiatric/Behavioral: Negative for depression, suicidal ideas, hallucinations, memory loss and substance abuse. The patient is not nervous/anxious and does not have insomnia.   All other systems reviewed and are negative.   Blood pressure 110/84, pulse 81, temperature 98.3 F (36.8 C), temperature source Tympanic, height  (1.803 m), weight 130 lb 3.2 oz (59.058 kg), last menstrual period 05/27/2016, SpO2 97 %.Body mass index is 18.17 kg/(m^2).  General Appearance: Well  Groomed  Eye Contact:  Good  Speech:  Normal Rate  Volume:  Normal  Mood:  good  Affect:  Bright, smiling  Thought Process:  Linear  Orientation:  Full (Time, Place, and Person)  Thought Content:  Negative  Suicidal Thoughts:  No  Homicidal Thoughts:  No  Memory:  Immediate;   Good Recent;   Good Remote;   Good  Judgement:  Good  Insight:  Good  Psychomotor Activity:  Negative  Concentration:  Good  Recall:  Good  Fund of Knowledge: Good  Language: Good  Akathisia:  Negative  Handed:    AIMS (if indicated):    Assets:  Communication Skills Desire for Improvement Social Support  ADL's:  Intact  Cognition: WNL  Sleep:  good   Is the patient at risk to self?  No. Has the patient been a risk to self in the past 6 months?  No. Has the patient been a risk to self within the distant past?  No. Is the patient a risk to others?  No. Has the patient been a risk to others in the past 6 months?  No. Has the patient been a risk to others within the distant past?  No.  Current Medications: Current Outpatient Prescriptions  Medication Sig Dispense Refill  . albuterol (PROAIR HFA) 108 (90 BASE) MCG/ACT inhaler Inhale into the lungs.    . ALPRAZolam (XANAX) 0.5 MG tablet Take 0.5 mg by mouth 2 (two) times daily as needed for anxiety.    . cetirizine (ZYRTEC) 10 MG tablet Take 10 mg by mouth daily.    . fluconazole (DIFLUCAN) 150 MG tablet Take 150 mg by mouth daily.  0  . fluticasone (FLONASE) 50 MCG/ACT nasal spray Place into both nostrils daily.    Marland Kitchen gabapentin (NEURONTIN) 400 MG capsule Take 400 mg by mouth 2 (two) times daily.  2  . oxyCODONE-acetaminophen (ROXICET) 5-325 MG tablet Take 1-2 tablets by mouth every 4 (four) hours as needed. 30 tablet 0  . venlafaxine XR (EFFEXOR-XR) 75 MG 24 hr capsule Take 1 capsule (75 mg total) by mouth daily with breakfast. 30 capsule 2   No current facility-administered medications for this visit.    Medical Decision Making:  Established  Problem, Stable/Improving (1) and Review of Medication Regimen & Side Effects (2)  Treatment Plan Summary:Medication management and Plan   Major depressive disorder, mixed features  patient reports a good response and improvement to Effexor XR 75 mg daily.  Patient also takes Neurontin 800 mg at bedtime. Discussed with her about her medications and she will continue them on a regular basis.  She will follow-up in 2 months or earlier depending on her symptoms   More than 50% of the time spent in psychoeducation, counseling and coordination of care.    This note was generated in part or whole  with voice recognition software. Voice regonition is usually quite accurate but there are transcription errors that can and very often do occur. I apologize for any typographical errors that were not detected and corrected.     Brandy HaleUzma Myles Mallicoat, MD  06/18/2016, 10:15 AM

## 2016-06-24 ENCOUNTER — Ambulatory Visit: Payer: BLUE CROSS/BLUE SHIELD | Admitting: Physical Therapy

## 2016-06-29 ENCOUNTER — Ambulatory Visit (INDEPENDENT_AMBULATORY_CARE_PROVIDER_SITE_OTHER): Payer: No Typology Code available for payment source | Admitting: Licensed Clinical Social Worker

## 2016-06-29 DIAGNOSIS — F331 Major depressive disorder, recurrent, moderate: Secondary | ICD-10-CM

## 2016-07-06 ENCOUNTER — Ambulatory Visit: Payer: BLUE CROSS/BLUE SHIELD | Attending: Internal Medicine | Admitting: Physical Therapy

## 2016-07-06 DIAGNOSIS — M533 Sacrococcygeal disorders, not elsewhere classified: Secondary | ICD-10-CM | POA: Diagnosis present

## 2016-07-06 DIAGNOSIS — R29810 Facial weakness: Secondary | ICD-10-CM | POA: Diagnosis present

## 2016-07-06 DIAGNOSIS — M6281 Muscle weakness (generalized): Secondary | ICD-10-CM | POA: Insufficient documentation

## 2016-07-06 DIAGNOSIS — M629 Disorder of muscle, unspecified: Secondary | ICD-10-CM

## 2016-07-06 DIAGNOSIS — R279 Unspecified lack of coordination: Secondary | ICD-10-CM | POA: Diagnosis not present

## 2016-07-06 NOTE — Patient Instructions (Signed)
[email or web address]  Refer you to Cristino MartesMaureen Duprez at [facility] (lymphedema specialist)    Open book (handout)    Plate Balance Exercise

## 2016-07-06 NOTE — Therapy (Signed)
[facility] MAIN [facility] SERVICES [address] [place], [place], [address] Phone: [phone]   Fax:  [phone]  Physical Therapy Treatment / Discharge Summary  Patient Details  Name: Kristy Shaw MRN: [id number] Date of Birth: [birth year] Referring Provider: Shirlee Latch   Encounter Date: 07/06/2016      PT End of Session - 07/06/16 1435    Visit Number 9   Number of Visits 12   Date for PT Re-Evaluation 06/17/16   PT Start Time 1330   PT Stop Time 1420   PT Time Calculation (min) 50 min   Activity Tolerance Patient tolerated treatment well   Behavior During Therapy [facility] for tasks assessed/performed      Past Medical History  Diagnosis Date  . Anxiety   . Asthma   . Bipolar disorder (HCC)   . Mitral valve prolapse   . Depression   . Fatigue   . Restless leg syndrome   . Fibromyalgia   . 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 Allergy     seasonal  . Sleep apnea     Past Surgical History  Procedure Laterality Date  . Nasal septum surgery    . Dilation and curettage of uterus    . Breast lumpectomy with radioactive seed localization Left 06/07/2016    Procedure: LEFT BREAST LUMPECTOMY WITH RADIOACTIVE SEED LOCALIZATION;  Surgeon: [name], MD;  Location: [facility];  Service: General;  Laterality: Left;    There were no vitals filed for this visit.      Subjective Assessment - 07/06/16 1344    Subjective Pt reported she was sore after her lumpectomy surgery. Pt has noticed swelling in both of her legs for a few days and also has had limited L UE ROM. Pt has slowly regained her energy. Pt finds she needs to have a babysitter to complete her HEP for the "full effect" so that she does not get interrupted by her dtr.  Pt feels achiness in her spine from sleeping on her couch.    Patient Stated Goals  to be as pain free as possible without medication and would like to get off gabapentin for fibromyalgia.               [facility] PT Assessment - 07/06/16 1353    AROM   Overall AROM Comments no pain with all planes of motion   Palpation   Spinal mobility T 3 SP shifted L with tenderness  tensions along L paraspinals of T10                      [facility] Adult PT Treatment/Exercise - 07/06/16 1508    Therapeutic Activites    Other Therapeutic Activities reassessed goals, referred pt to lymphedema specialist    Exercises   Exercises --  open book and plate exercise                PT Education - 07/06/16 1509    Education provided Yes   Education Details HEP and d/c and refer to lymphedema specialist   Person(s) Educated Patient   Methods Explanation;Demonstration;Tactile cues;Verbal cues;Handout   Comprehension Verbal cues required;Returned demonstration;Verbalized understanding             PT Long Term Goals - 07/06/16 1335    PT LONG TERM GOAL #1   Title Pt will be able to get ready in the morning and out of the house < 1  hr in order to demo IND with energy conservation and managed pain.   Time 12   Period Weeks   Status Achieved   PT LONG TERM GOAL #2   Title Pt will decrease her PGQ score from 55% to < 45% in order to perform ADLs and work tasks. (5/2: 36%)    Time 12   Period Weeks   Status Achieved   PT LONG TERM GOAL #3   Title Pt will decrease her PFDI score from 49%    to < 39% to improve pelvic floor function and improve QOL. (5/2: 41%) (7/18: 40%)    Time 12   Period Weeks   Status Achieved   PT LONG TERM GOAL #4   Title Pt will report decrease pain at neck and shoulders from 5/10 to < 2/10 and demo decreased forward head (change in 19 cm acromium to auditory meatus B to < 17 cm)  in order to prepare for the morning to perform ADLs.    Time 12   Period Weeks   Status Achieved   PT LONG TERM GOAL #5   Title Pt will  report decreasing soda intake, increasing water to achieve a 1:1 ratio in order to decrease urge incontinence and SUI from 2x/  month to 1x/ month.  (5/4: SUI resolved, urge is 1 x / in 2 week)    Time 12   Period Weeks   Status Achieved   PT LONG TERM GOAL #6   Title Pt will demo 1.5 fingers width below sternum in order to increase intraabdominal pressure for postural stability when lifting and cleaning house.   Time 12   Period Weeks   Status Achieved   PT LONG TERM GOAL #7   Title Pt will demo IND with HEP in order to maintain the benefits of PT with utilization of restorative practices and aquatic exercises.    Time 12   Period Weeks   Status Achieved               Plan - 07/06/16 1435    Clinical Impression Statement Pt has achieved 100% of her goals and has returned to working her job, lifting, and performing ADLs without LBP pain and with increased energy levels. Pt's pelvic floor function has also improved with decreased SUI.  Pt has improved with PT as she showed resolved diastasis recti, improved posture and body mechanics, and increased deep core mm and back/ LE strength. Pt rated on the Global Rate of Change, she felt "A Great Deal Better" since [facility]. Pt is ready for d/c at this time. Pt was referred toa lymphedema specilaist as pt reported she has had limited ROM in her L UE and swelling in BLE following her lumpectomy procedures on 06/07/16. PT called Dr. Carolynne Edouard and left a message with his RN re: her Sx.     Rehab Potential Good   Clinical Impairments Affecting Rehab Potential co-morbidities, smoker, fibromyalgia, and waiting for biopsy reports for breast lumps, occupation: house-cleaning w/ excessive lifting, vaccuming,and floor to stand     PT Frequency 1x / week   PT Duration 12 weeks   PT Treatment/Interventions ADLs/Self Care Home Management;Aquatic Therapy;Cryotherapy;Biofeedback;Moist Heat;Gait training;Neuromuscular re-education;Balance training;Therapeutic exercise;Therapeutic activities;Functional mobility training;Stair training;Patient/family education;Manual techniques;Taping;Energy  conservation;Scar mobilization;Electrical Stimulation   Consulted and Agree with Plan of Care Patient      Patient will benefit from skilled therapeutic intervention in order to improve the following deficits and impairments:  Pain, Impaired sensation, Improper body mechanics, Postural  dysfunction, Decreased mobility, Decreased coordination, Decreased endurance, Decreased range of motion, Decreased strength, Decreased balance, Difficulty walking, Decreased activity tolerance, Hypermobility  Visit Diagnosis: Unspecified lack of coordination  Muscle weakness (generalized)  Fascial defect  Sacroiliac dysfunction  Facial weakness     Problem List Patient Active Problem List   Diagnosis Date Noted  . Anxiety 06/10/2014  . Airway hyperreactivity 06/10/2014  . Clinical depression 06/10/2014  . Gastroduodenal ulcer 06/10/2014  . Reflux 06/10/2014    Mariane MastersYeung,Shin Yiing ,PT, DPT, E-RYT  07/06/2016, 3:14 PM  [facility] MAIN [facility] [address] [place], [place], [address] Phone: [phone]   Fax:  [phone]  Name: Kristy Shaw MRN: [id number] Date of Birth: 06/18/1976

## 2016-07-08 NOTE — Progress Notes (Signed)
   THERAPIST PROGRESS NOTE  Session Time: 8460  Participation Level: Active  Behavioral Response: CasualAlertAnxious  Type of Therapy: Individual Therapy  Treatment Goals addressed: Anxiety and Coping  Interventions: CBT, Motivational Interviewing, Solution Focused, Strength-based, Supportive, Family Systems and Reframing  Summary: Kristy Shaw is a 40 y.o. female who presents with continued symptoms of her diagnosis.  She was able to remain her frustration since her previous session.  She recently went on vacation with her husband, sister and children.  She stated that she is now concerned with her sister and her drinking. She reports that now she is putting up boundaries with her sister, mother and baby sitting.  She reports that she recently had surgery and it went well.  She denies any recent or new symptoms.  She reports that the coping skills taught previously are working great.  She reports that she wants to continue to learn how to reduce her anger, sadness.  She reports that she wants to learn how to utilize boundaries with her mother and her sister.  She denies using a journal.   Suicidal/Homicidal: Nowithout intent/plan  Therapist Response: LCSW provided Patient with ongoing emotional support and encouragement.  Normalized her feelings.  Commended Patient on her progress and reinforced the importance of client staying focused on her own strengths and resources and resiliency. Processed various strategies for dealing with stressors.    Plan: Return again in 4 weeks.  Diagnosis: Axis I: Depression    Axis II: No diagnosis    Marinda Elkicole M Peacock, LCSW 07/08/2016

## 2016-07-16 ENCOUNTER — Encounter: Payer: Self-pay | Admitting: Hematology and Oncology

## 2016-07-16 ENCOUNTER — Telehealth: Payer: Self-pay | Admitting: Hematology and Oncology

## 2016-07-16 NOTE — Telephone Encounter (Signed)
Patient returned my call to schedule an appt. She has an appt with VG on 8/9 at 345pm. Letter to the referring and mailed to the patient.

## 2016-07-19 ENCOUNTER — Encounter: Payer: Self-pay | Admitting: Psychiatry

## 2016-07-19 ENCOUNTER — Ambulatory Visit (INDEPENDENT_AMBULATORY_CARE_PROVIDER_SITE_OTHER): Payer: BLUE CROSS/BLUE SHIELD | Admitting: Psychiatry

## 2016-07-19 ENCOUNTER — Telehealth: Payer: Self-pay | Admitting: *Deleted

## 2016-07-19 VITALS — BP 145/84 | HR 66 | Temp 98.7°F | Ht 71.0 in | Wt 133.8 lb

## 2016-07-19 DIAGNOSIS — F39 Unspecified mood [affective] disorder: Secondary | ICD-10-CM

## 2016-07-19 MED ORDER — ALPRAZOLAM 0.5 MG PO TABS: 0.5000 mg | ORAL_TABLET | Freq: Every evening | ORAL | 0 refills | Status: DC | PRN

## 2016-07-19 MED ORDER — VENLAFAXINE HCL ER 75 MG PO CP24: 75.0000 mg | ORAL_CAPSULE | Freq: Every day | ORAL | 2 refills | Status: DC

## 2016-07-19 MED ORDER — GABAPENTIN 400 MG PO CAPS: 400.0000 mg | ORAL_CAPSULE | Freq: Two times a day (BID) | ORAL | 2 refills | Status: DC

## 2016-07-19 NOTE — Progress Notes (Signed)
[facility] MD/PA/NP OP Progress Note  07/19/2016 11:15 AM Kristy Shaw  MRN:  [id number]  Subjective:  Patient is a 40 year old female who presented for the follow-up appointment. She reported that she is doing well on her current medications. We discussed about the medications 18. She reported that she is doing well on her medications. She reported that she has a rocky relationship with her sister. Her mothers are supportive of the sister and she is trying to do well on her medications. We also discussed about the relationship issues with her sister Patient reported that she is going to talk to her primary care physician about doing the treatment for the varicose veins and she agreed with the plan. She reported that she is doing well on the Effexor and the Neurontin. 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 She currently denied having any suicidal homicidal ideations or plans. She appeared very happy and calm during the interview.  . Patient currently denied having any mood  swings anger anxiety or paranoia.   She  follows with her primary care physician on a regular basis. Patient reported that she does not have any problems with thyroid and she has followed up with her primary care on a regular basis. She feels tired during the daytime.   Chief Complaint: Chief Complaint    Follow-up; Medication Refill     Visit Diagnosis:     ICD-9-CM ICD-10-CM   1. Mood disorder (HCC) 296.90 F39     Past Medical History:  Past Medical History:  Diagnosis Date  . Allergy    seasonal  . Anxiety   . Asthma   . Bipolar disorder (HCC)   . 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 Depression   . Fatigue   . Fibromyalgia   . Mitral valve prolapse   . Restless leg syndrome   . Sleep apnea     Past Surgical History:  Procedure Laterality Date  . BREAST LUMPECTOMY WITH RADIOACTIVE SEED LOCALIZATION Left 06/07/2016   Procedure: LEFT BREAST LUMPECTOMY WITH  RADIOACTIVE SEED LOCALIZATION;  Surgeon: [name], MD;  Location: [facility];  Service: General;  Laterality: Left;  . DILATION AND CURETTAGE OF UTERUS    . NASAL SEPTUM SURGERY     Family History:  Family History  Problem Relation Age of Onset  . Hypertension Mother   . Stroke Mother   . Heart attack Father   . Stroke Father   . Depression Father   . Depression Sister   . Kidney disease Brother   . ADD / ADHD Brother   . Asthma Brother   . Breast cancer Maternal Grandmother 66   Social History:  Social History   Social History  . Marital status: Married    Spouse name: N/A  . Number of children: N/A  . Years of education: N/A   Social History Main Topics  . Smoking status: Current Every Day Smoker    Packs/day: 0.50    Types: Cigarettes    Start date: 09/23/1990  . Smokeless tobacco: Never Used  . Alcohol use No  . Drug use: No  . Sexual activity: Yes    Birth control/ protection: None   Other Topics Concern  . None   Social History Narrative  . None   Additional History:   Assessment:   Musculoskeletal: Strength & Muscle Tone: within normal limits Gait & Station: normal Patient leans: N/A  Psychiatric Specialty Exam: HPI  Review of Systems  Psychiatric/Behavioral: Negative for depression, hallucinations, memory loss, substance abuse and suicidal ideas. The patient is not nervous/anxious and does not have insomnia.   All other systems reviewed and are negative.   Blood pressure (!) 145/84, pulse 66, temperature 98.7 F (37.1 C), temperature source Oral, height 5\' 11"  (1.803 m), weight 133 lb 12.8 oz (60.7 kg), last menstrual period 06/26/2016.Body mass index is 18.66 kg/m.  General Appearance: Well Groomed  Eye Contact:  Good  Speech:  Normal Rate  Volume:  Normal  Mood:  good  Affect:  Bright, smiling  Thought Process:  Linear  Orientation:  Full (Time, Place, and Person)  Thought Content:  Negative  Suicidal Thoughts:  No   Homicidal Thoughts:  No  Memory:  Immediate;   Good Recent;   Good Remote;   Good  Judgement:  Good  Insight:  Good  Psychomotor Activity:  Negative  Concentration:  Good  Recall:  Good  Fund of Knowledge: Good  Language: Good  Akathisia:  Negative  Handed:    AIMS (if indicated):    Assets:  Communication Skills Desire for Improvement Social Support  ADL's:  Intact  Cognition: WNL  Sleep:  good   Is the patient at risk to self?  No. Has the patient been a risk to self in the past 6 months?  No. Has the patient been a risk to self within the distant past?  No. Is the patient a risk to others?  No. Has the patient been a risk to others in the past 6 months?  No. Has the patient been a risk to others within the distant past?  No.  Current Medications: Current Outpatient Prescriptions  Medication Sig Dispense Refill  . albuterol (PROAIR HFA) 108 (90 BASE) MCG/ACT inhaler Inhale into the lungs.    . ALPRAZolam (XANAX) 0.5 MG tablet Take 0.5 mg by mouth 2 (two) times daily as needed for anxiety.    . cetirizine (ZYRTEC) 10 MG tablet Take 10 mg by mouth daily.    . fluconazole (DIFLUCAN) 150 MG tablet Take 150 mg by mouth daily.  0  . fluticasone (FLONASE) 50 MCG/ACT nasal spray Place into both nostrils daily.    Marland Kitchen gabapentin (NEURONTIN) 400 MG capsule Take 1 capsule (400 mg total) by mouth 2 (two) times daily. 60 capsule 2  . oxyCODONE-acetaminophen (ROXICET) 5-325 MG tablet Take 1-2 tablets by mouth every 4 (four) hours as needed. 30 tablet 0  . venlafaxine XR (EFFEXOR-XR) 75 MG 24 hr capsule Take 1 capsule (75 mg total) by mouth daily with breakfast. 30 capsule 2   No current facility-administered medications for this visit.     Medical Decision Making:  Established Problem, Stable/Improving (1) and Review of Medication Regimen & Side Effects (2)  Treatment Plan Summary:Medication management and Plan   Major depressive disorder, mixed features  patient reports a good  response and improvement on  Effexor XR 75 mg daily.  Patient also takes Neurontin 800 mg at bedtime. Discussed with her about her medications and she will continue them on a regular basis.  She will follow-up in 2 months or earlier depending on her symptoms   More than 50% of the time spent in psychoeducation, counseling and coordination of care.    This note was generated in part or whole with voice recognition software. Voice regonition is usually quite accurate but there are transcription errors that can and very often do occur. I apologize  for any typographical errors that were not detected and corrected.     Brandy Hale, MD  07/19/2016, 11:15 AM

## 2016-07-19 NOTE — Telephone Encounter (Signed)
Received appt date/time from Andrea.  Placed a note for an intake form to be given to the pt at time of check in. 

## 2016-07-28 ENCOUNTER — Ambulatory Visit (HOSPITAL_BASED_OUTPATIENT_CLINIC_OR_DEPARTMENT_OTHER): Payer: BLUE CROSS/BLUE SHIELD | Admitting: Hematology and Oncology

## 2016-07-28 ENCOUNTER — Encounter: Payer: Self-pay | Admitting: Hematology and Oncology

## 2016-07-28 DIAGNOSIS — Z803 Family history of malignant neoplasm of breast: Secondary | ICD-10-CM

## 2016-07-28 DIAGNOSIS — Z720 Tobacco use: Secondary | ICD-10-CM

## 2016-07-28 DIAGNOSIS — N6489 Other specified disorders of breast: Secondary | ICD-10-CM | POA: Diagnosis not present

## 2016-07-28 NOTE — Progress Notes (Signed)
[facility] CONSULT NOTE  Patient Care Team: Margaretann Loveless, MD as PCP - General (Internal Medicine)  CHIEF COMPLAINTS/PURPOSE OF CONSULTATION:  Newly diagnosed complex sclerosing lesion  HISTORY OF PRESENTING ILLNESS:  Kristy Shaw 40 y.o. female is here because of recent diagnosis of left breast complex sclerosing lesion, radial scar, pseudo-angiomatous stromal hyperplasia. Patient had a routine screening mammogram in March 2017 in [place]. It detected an abnormality in the left breast. They were not able to identify the lesion by ultrasound at [facility]. She was then referred for stereotactic biopsy decreased total. The biopsy revealed radial scar and complex sclerosing lesion. She was then referred to see Dr. Carolynne Edouard who performed a lumpectomy on 06/07/2016 which confirmed the presence of radial scar with calcifications along with fibrocystic changes and PASH. She was sent to [place] for discussion regarding risk reduction strategies. She is here today accompanied by her husband.  I reviewed her records extensively and collaborated the history with the patient.  MEDICAL HISTORY:  Past Medical History:  Diagnosis Date  . Allergy    seasonal  . Anxiety   . Asthma   . Bipolar disorder (HCC)   . 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 Depression   . Fatigue   . Fibromyalgia   . Mitral valve prolapse   . Restless leg syndrome   . Sleep apnea     SURGICAL HISTORY: Past Surgical History:  Procedure Laterality Date  . BREAST LUMPECTOMY WITH RADIOACTIVE SEED LOCALIZATION Left 06/07/2016   Procedure: LEFT BREAST LUMPECTOMY WITH RADIOACTIVE SEED LOCALIZATION;  Surgeon: [name], MD;  Location: [facility];  Service: General;  Laterality: Left;  . DILATION AND CURETTAGE OF UTERUS    . NASAL SEPTUM SURGERY      SOCIAL HISTORY: Social History   Social History  . Marital status: Married    Spouse name: N/A  . Number of children: N/A  . Years of  education: N/A   Occupational History  . Not on file.   Social History Main Topics  . Smoking status: Current Every Day Smoker    Packs/day: 0.50    Types: Cigarettes    Start date: 09/23/1990  . Smokeless tobacco: Never Used  . Alcohol use No  . Drug use: No  . Sexual activity: Yes    Birth control/ protection: None   Other Topics Concern  . Not on file   Social History Narrative  . No narrative on file    FAMILY HISTORY: Family History  Problem Relation Age of Onset  . Hypertension Mother   . Stroke Mother   . Heart attack Father   . Stroke Father   . Depression Father   . Depression Sister   . Kidney disease Brother   . ADD / ADHD Brother   . Asthma Brother   . Breast cancer Maternal Grandmother 60    ALLERGIES:  is allergic to adhesive [tape] and doxycycline.  MEDICATIONS:  Current Outpatient Prescriptions  Medication Sig Dispense Refill  . albuterol (PROAIR HFA) 108 (90 BASE) MCG/ACT inhaler Inhale into the lungs.    . ALPRAZolam (XANAX) 0.5 MG tablet Take 1 tablet (0.5 mg total) by mouth at bedtime as needed for anxiety. 30 tablet 0  . cetirizine (ZYRTEC) 10 MG tablet Take 10 mg by mouth daily.    . fluconazole (DIFLUCAN) 150 MG tablet Take 150 mg by mouth daily.  0  . fluticasone (FLONASE) 50 MCG/ACT nasal spray Place into both  nostrils daily.    Marland Kitchen gabapentin (NEURONTIN) 400 MG capsule Take 1 capsule (400 mg total) by mouth 2 (two) times daily. 60 capsule 2  . oxyCODONE-acetaminophen (ROXICET) 5-325 MG tablet Take 1-2 tablets by mouth every 4 (four) hours as needed. 30 tablet 0  . venlafaxine XR (EFFEXOR-XR) 75 MG 24 hr capsule Take 1 capsule (75 mg total) by mouth daily with breakfast. 30 capsule 2   No current facility-administered medications for this visit.     REVIEW OF SYSTEMS:   Constitutional: Denies fevers, chills or abnormal night sweats Eyes: Denies blurriness of vision, double vision or watery eyes Ears, nose, mouth, throat, and face: Denies  mucositis or sore throat Respiratory: Denies cough, dyspnea or wheezes Cardiovascular: Denies palpitation, chest discomfort or lower extremity swelling Gastrointestinal:  Denies nausea, heartburn or change in bowel habits Skin: Denies abnormal skin rashes Lymphatics: Denies new lymphadenopathy or easy bruising Neurological:Denies numbness, tingling or new weaknesses Behavioral/Psych: Mood is stable, no new changes  Breast: Left lumpectomy All other systems were reviewed with the patient and are negative.  PHYSICAL EXAMINATION: ECOG PERFORMANCE STATUS: 1 - Symptomatic but completely ambulatory  Vitals:   07/28/16 1552  BP: 136/78  Pulse: 64  Resp: 18  Temp: 99.5 F (37.5 C)   Filed Weights   07/28/16 1552  Weight: 134 lb 9.6 oz (61.1 kg)    GENERAL:alert, no distress and comfortable SKIN: skin color, texture, turgor are normal, no rashes or significant lesions EYES: normal, conjunctiva are pink and non-injected, sclera clear OROPHARYNX:no exudate, no erythema and lips, buccal mucosa, and tongue normal  NECK: supple, thyroid normal size, non-tender, without nodularity LYMPH:  no palpable lymphadenopathy in the cervical, axillary or inguinal LUNGS: clear to auscultation and percussion with normal breathing effort HEART: regular rate & rhythm and no murmurs and no lower extremity edema ABDOMEN:abdomen soft, non-tender and normal bowel sounds Musculoskeletal:no cyanosis of digits and no clubbing  PSYCH: alert & oriented x 3 with fluent speech NEURO: no focal motor/sensory deficits  LABORATORY DATA:  I have reviewed the data as listed Lab Results  Component Value Date   WBC 8.2 12/25/2014   HGB 7.2 (L) 12/25/2014   HCT 21.5 (L) 12/25/2014   MCV 94 12/25/2014   PLT 166 12/25/2014   Lab Results  Component Value Date   NA 137 12/24/2014   K 3.5 12/24/2014   CL 105 12/24/2014   CO2 23 12/24/2014    RADIOGRAPHIC STUDIES: I have personally reviewed the radiological  reports and agreed with the findings in the report.  ASSESSMENT AND PLAN:  Radial scar of breast Left breast biopsy upper quadrant 03/18/2016: Fibrocystic changes with PASH, second biopsy also upper quadrant complex sclerosing lesion Left lumpectomy 06/07/2016: Radial scar with calcifications, fibrocystic changes, PASH  Pathology counseling: I discussed with the patient the clinical significance of radial scar and complex sclerosing lesion as well as pseudo-angiomatous stromal hyperplasia. We also discussed the progression of disease to precancerous lesions and DCIS and then to breast cancer.  Prognosis: Based on American Cancer Society breast cancer risk assessment tool, patient's 5 year risk of breast cancer is at 1.2% as opposed to an average woman's risk of 0.6%, her lifetime risk of developing breast cancer was at 16.5% as opposed to an average woman's risk of 12.5%.  Based on these findings, I did not recommend wrist reduction treatments with either tamoxifen or raloxifene. We discussed the importance of physical exercise 30 minutes of workout every day that can reduce the  risk of breast cancer recurrence by 25%. We also discussed importance of reducing red meat and increasing fruits and vegetables. We discussed importance of vitamin D as well as to moderate and decreasing breast cancer risk.  Patient understands his risks and is willing to follow-up with annual mammograms and breast exams with her primary care physician and Dr. Carolynne Edouardoth. We are happy to see the patient on an as-needed basis. All questions were answered. The patient knows to call the clinic with any problems, questions or concerns.    Sabas SousGudena, Zuma Hust K, MD 07/28/16

## 2016-07-28 NOTE — Assessment & Plan Note (Signed)
Left breast biopsy upper quadrant 03/18/2016: Fibrocystic changes with PASH, second biopsy also upper quadrant complex sclerosing lesion Left lumpectomy 06/07/2016: Radial scar with calcifications, fibrocystic changes, PASH  Pathology counseling: I discussed with the patient the clinical significance of radial scar and complex sclerosing lesion as well as pseudo-angiomatous stromal hyperplasia. We also discussed the progression of disease to precancerous lesions and DCIS and then to breast cancer.  Prognosis:

## 2016-07-30 ENCOUNTER — Ambulatory Visit (INDEPENDENT_AMBULATORY_CARE_PROVIDER_SITE_OTHER): Payer: BLUE CROSS/BLUE SHIELD | Admitting: Licensed Clinical Social Worker

## 2016-07-30 DIAGNOSIS — F331 Major depressive disorder, recurrent, moderate: Secondary | ICD-10-CM

## 2016-07-30 NOTE — Progress Notes (Signed)
   THERAPIST PROGRESS NOTE  Session Time: 60min  Participation Level: Active  Behavioral Response: CasualAlertIrritable  Type of Therapy: Individual Therapy  Treatment Goals addressed: Coping and Diagnosis: Depression  Interventions: CBT, Motivational Interviewing, Solution Focused, Supportive, Family Systems and Reframing  Summary: Kristy Shaw is a 40 y.o. female who presents with continued symptoms of her diagnosis.  She was able to vent her frustration and current worry concerning her younger sister and mother.  She reports that her family is trying to put unrealistic expectations on to her and she is aware that this may affect her mental health.  She is unable to continues to babysit.  Role-playing with patient on how to communicate her concerns with her family.  Discussion of her being able to understand her limitations and being able to say no   Suicidal/Homicidal: Nowithout intent/plan  Therapist Response: LCSW provided Patient with ongoing emotional support and encouragement.  Normalized her feelings.  Commended Patient on her progress and reinforced the importance of client staying focused on her own strengths and resources and resiliency. Processed various strategies for dealing with stressors.    Plan: Return again in 4 weeks.  Diagnosis: Axis I: Depression    Axis II: No diagnosis    Marinda Elkicole M Peacock, LCSW 07/30/2016

## 2016-08-05 ENCOUNTER — Ambulatory Visit: Payer: BLUE CROSS/BLUE SHIELD | Attending: Internal Medicine | Admitting: Occupational Therapy

## 2016-08-05 DIAGNOSIS — R600 Localized edema: Secondary | ICD-10-CM | POA: Insufficient documentation

## 2016-08-05 NOTE — Therapy (Signed)
[facility] [address] [place], [place], [address] Phone: [phone]   Fax:  [phone]  Occupational Therapy Treatment  Patient Details  Name: Kristy Shaw M Lambe MRN: [id number] Date of Birth: 09/09/1976 Referring Provider: Carolynne Edouardth  Encounter Date: 08/05/2016      OT End of Session - 08/05/16 1445    Visit Number 1   Number of Visits 2   Date for OT Re-Evaluation 09/02/16   OT Start Time 1103   OT Stop Time 1149   OT Time Calculation (min) 46 min   Activity Tolerance Patient tolerated treatment well   Behavior [facility] Therapy [facility] for tasks assessed/performed      Past Medical History:  Diagnosis Date  . Allergy    seasonal  . Anxiety   . Asthma   . Bipolar disorder (HCC)   . 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 Depression   . Fatigue   . Fibromyalgia   . Mitral valve prolapse   . Restless leg syndrome   . Sleep apnea     Past Surgical History:  Procedure Laterality Date  . BREAST LUMPECTOMY WITH RADIOACTIVE SEED LOCALIZATION Left 06/07/2016   Procedure: LEFT BREAST LUMPECTOMY WITH RADIOACTIVE SEED LOCALIZATION;  Surgeon: Chevis PrettyPaul Toth III, MD;  Location: [facility];  Service: General;  Laterality: Left;  . DILATION AND CURETTAGE OF UTERUS    . NASAL SEPTUM SURGERY      There were no vitals filed for this visit.      Subjective Assessment - 08/05/16 1437    Subjective  I seen the PT for back pain - and doing better - had lumpectomy  6/19 but doing good - no swelling - but I have for while now swelling in both legs below knee- so the PT recommend me seeing you    Patient Stated Goals I do not want my swellling in my legs to get worse    Currently in Pain? 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         LYMPHEDEMA/ONCOLOGY QUESTIONNAIRE - 08/05/16 1440      Surgeries   Lumpectomy Date 06/07/16   Number Lymph Nodes Removed --  appear none     Treatment   Active Chemotherapy Treatment No   Active Radiation Treatment No   Past Radiation Treatment No   Current Hormone Treatment No   Past Hormone Therapy No     What other symptoms do you have   Are you Having Heaviness or Tightness Yes   Are you having Pain No   Other Symptoms Short sock indention mid calf     Right Lower Extremity Lymphedema   At Midpatella/Popliteal Crease 33 cm   30 cm Proximal to Floor at Lateral Plantar Foot 35.5 cm   20 cm Proximal to Floor at Lateral Plantar Foot 26 1   10  cm Proximal to Floor at Lateral Malleoli 22.4 cm   Circumference of ankle/heel 32.6 cm.   5 cm Proximal to 1st MTP Joint 27  cm   Around Proximal Great Toe 7.4 cm     Left Lower Extremity Lymphedema   At Midpatella/Popliteal Crease 33 cm   30 cm Proximal to Floor at Lateral Plantar Foot 36 cm   20 cm Proximal to Floor at Lateral Plantar Foot 26.4 cm   10 cm Proximal to Floor at Lateral Malleoli 23.2 cm   Circumference of ankle/heel 32 cm.   5 cm Proximal to 1st MTP Joint 22 cm   Around Proximal Great Toe 8 cm                         OT Education - 08/05/16 1445    Education provided Yes   Education Details Hand out on lymphedema - demo MLD for knee and below knee   Person(s) Educated Patient   Methods Explanation;Demonstration;Handout;Verbal cues;Tactile cues   Comprehension Verbalized understanding;Returned demonstration;Verbal cues required             OT Long Term Goals - 08/05/16 1452      OT LONG TERM GOAL #1   Title Pt to be ind in homeprogram for preventing bilateral LE edema to get worse and relieve from heaviness/tightness   Time 4   Period Weeks   Status New               Plan  - 08/05/16 1446    Clinical Impression Statement Pt refer by PT and her MD for bilateral LE swelling and decrease ROM at L shoulder - pt report she is doing fine at L UE , no swelling - but bilateral LE - has sock marks mid calf and feel heavy -  pt risk for lymphedema is nearly non excisting because of no lymphnodes removed and no radiation - but appear pt has some varicose veins - and family history - pt measurements taken for circumference - pt to get knee high compression - taught MLD for knee and below knee - to wear knee high  20-9630mmhg compression and will reassess after wearing it for 2 wks    Rehab Potential Good   OT Frequency Other (comment)  2 visits   OT Duration 4 weeks   OT Treatment/Interventions Self-care/ADL training;Manual lymph drainage   Plan reassess after wearing compression for 2 wks   OT Home Exercise Plan seept instruction   Consulted and Agree with Plan of Care Patient      Patient will benefit from skilled therapeutic intervention in order to improve the following deficits and impairments:  Increased edema  Visit Diagnosis: Localized edema    Problem List Patient Active Problem List   Diagnosis Date Noted  . 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 Anxiety 06/10/2014  . Airway hyperreactivity 06/10/2014  . Clinical depression 06/10/2014  . Gastroduodenal ulcer 06/10/2014  . Reflux 06/10/2014    Oletta CohnuPreez, Chantea Surace OTR/L,CLT  08/05/2016, 2:55 PM  [facility] [address] [place], [place], [address] Phone: [phone]   Fax:  [phone]  Name: Kristy Shaw M Langer MRN: [id number] Date of Birth: 12/26/1975

## 2016-08-05 NOTE — Patient Instructions (Signed)
MLD for knee and below knee   Recommend knee high compression - 20-8730mmhg  Follow up to remeasure 2 wks after wearing compression

## 2016-09-16 ENCOUNTER — Ambulatory Visit: Payer: BLUE CROSS/BLUE SHIELD | Admitting: Psychiatry

## 2016-09-17 ENCOUNTER — Ambulatory Visit (INDEPENDENT_AMBULATORY_CARE_PROVIDER_SITE_OTHER): Payer: BLUE CROSS/BLUE SHIELD | Admitting: Psychiatry

## 2016-09-17 ENCOUNTER — Encounter: Payer: Self-pay | Admitting: Psychiatry

## 2016-09-17 ENCOUNTER — Ambulatory Visit (INDEPENDENT_AMBULATORY_CARE_PROVIDER_SITE_OTHER): Payer: BLUE CROSS/BLUE SHIELD | Admitting: Licensed Clinical Social Worker

## 2016-09-17 ENCOUNTER — Ambulatory Visit: Payer: BLUE CROSS/BLUE SHIELD | Admitting: Psychiatry

## 2016-09-17 VITALS — BP 121/77 | HR 65 | Temp 98.8°F | Wt 133.6 lb

## 2016-09-17 DIAGNOSIS — M797 Fibromyalgia: Secondary | ICD-10-CM

## 2016-09-17 DIAGNOSIS — F331 Major depressive disorder, recurrent, moderate: Secondary | ICD-10-CM | POA: Diagnosis not present

## 2016-09-17 MED ORDER — GABAPENTIN 400 MG PO CAPS: 400.0000 mg | ORAL_CAPSULE | Freq: Two times a day (BID) | ORAL | 2 refills | Status: DC

## 2016-09-17 MED ORDER — DULOXETINE HCL 60 MG PO CPEP: 60.0000 mg | ORAL_CAPSULE | Freq: Every day | ORAL | 1 refills | Status: DC

## 2016-09-17 MED ORDER — PREGABALIN 50 MG PO CAPS: 50.0000 mg | ORAL_CAPSULE | Freq: Two times a day (BID) | ORAL | 1 refills | Status: DC

## 2016-09-17 NOTE — Progress Notes (Signed)
BH MD/PA/NP OP Progress Note  09/17/2016 10:09 AM Kristy Shaw  MRN:  [id number]  Subjective:  Patient is a 40 year old female who presented for the follow-up appointment. She reported that her PCP Dr. Welton FlakesKhan has recently switched her medications and started her on Cymbalta. She reported that she was noticing worsening of her fibromyalgia pain. The reported that she continues to have pain in her body. Patient reported that she wants to have her medications adjusted at this time. She wakes up with body aches and pains. We discussed about starting her on Lyrica and she agreed with the plan. She appears calm and alert during the interview. She was discussing issues about her daughter who was constipated at the school. She denied having any suicidal ideations or plans. She denied having any perceptual disturbances. She still takes Neurontin at bedtime.      Chief Complaint: Chief Complaint    Follow-up; Medication Refill     Visit Diagnosis:   No diagnosis found.  Past Medical History:  Past Medical History:  Diagnosis Date  . Allergy    seasonal  . Anxiety   . Asthma   . Bipolar disorder (HCC)   . 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 Depression   . Fatigue   . Fibromyalgia   . Mitral valve prolapse   . Restless leg syndrome   . Sleep apnea     Past Surgical History:  Procedure Laterality Date  . BREAST LUMPECTOMY WITH RADIOACTIVE SEED LOCALIZATION Left 06/07/2016   Procedure: LEFT BREAST LUMPECTOMY WITH RADIOACTIVE SEED LOCALIZATION;  Surgeon: Chevis PrettyPaul Toth III, MD;  Location: [facility];  Service: General;  Laterality: Left;  . DILATION AND CURETTAGE OF UTERUS    . NASAL SEPTUM SURGERY     Family History:  Family History  Problem Relation Age of Onset  . Hypertension Mother   . Stroke Mother   . Heart attack Father   . Stroke Father   . Depression Father   . Depression Sister   . Kidney disease Brother   . ADD / ADHD Brother   . Asthma Brother   . Breast  cancer Maternal Grandmother [phone]   Social History:  Social History   Social History  . Marital status: Married    Spouse name: N/A  . Number of children: N/A  . Years of education: N/A   Social History Main Topics  . Smoking status: Current Every Day Smoker    Packs/day: 0.50    Types: Cigarettes    Start date: 09/23/1990  . Smokeless tobacco: Never Used  . Alcohol use No  . Drug use: No  . Sexual activity: Yes    Birth control/ protection: None   Other Topics Concern  . None   Social History Narrative  . None   Additional History:   Assessment:   Musculoskeletal: Strength & Muscle Tone: within normal limits Gait & Station: normal Patient leans: N/A  Psychiatric Specialty Exam: Medication Refill     Review of Systems  Psychiatric/Behavioral: Negative for depression, hallucinations, memory loss, substance abuse and suicidal ideas. The patient is not nervous/anxious and does not have insomnia.   All other systems reviewed and are negative.   Blood pressure 121/77, pulse 65, temperature 98.8 F (37.1 C), temperature source Oral, weight 133 lb 9.6 oz (60.6 kg).Body mass index is 18.63 kg/m.  General Appearance: Well Groomed  Eye Contact:  Good  Speech:  Normal Rate  Volume:  Normal  Mood:  good  Affect:  Bright, smiling  Thought Process:  Linear  Orientation:  Full (Time, Place, and Person)  Thought Content:  Negative  Suicidal Thoughts:  No  Homicidal Thoughts:  No  Memory:  Immediate;   Good Recent;   Good Remote;   Good  Judgement:  Good  Insight:  Good  Psychomotor Activity:  Negative  Concentration:  Good  Recall:  Good  Fund of Knowledge: Good  Language: Good  Akathisia:  Negative  Handed:    AIMS (if indicated):    Assets:  Communication Skills Desire for Improvement Social Support  ADL's:  Intact  Cognition: WNL  Sleep:  good   Is the patient at risk to self?  No. Has the patient been a risk to self in the past 6 months?  No. Has the  patient been a risk to self within the distant past?  No. Is the patient a risk to others?  No. Has the patient been a risk to others in the past 6 months?  No. Has the patient been a risk to others within the distant past?  No.  Current Medications: Current Outpatient Prescriptions  Medication Sig Dispense Refill  . albuterol (PROAIR HFA) 108 (90 BASE) MCG/ACT inhaler Inhale into the lungs.    . ALPRAZolam (XANAX) 0.5 MG tablet Take 1 tablet (0.5 mg total) by mouth at bedtime as needed for anxiety. 30 tablet 0  . cetirizine (ZYRTEC) 10 MG tablet Take 10 mg by mouth daily.    . DULoxetine (CYMBALTA) 30 MG capsule Take 30 mg by mouth daily.  6  . fluconazole (DIFLUCAN) 150 MG tablet Take 150 mg by mouth daily.  0  . fluticasone (FLONASE) 50 MCG/ACT nasal spray Place into both nostrils daily.    Marland Kitchen gabapentin (NEURONTIN) 400 MG capsule Take 1 capsule (400 mg total) by mouth 2 (two) times daily. 60 capsule 2  . oxyCODONE-acetaminophen (ROXICET) 5-325 MG tablet Take 1-2 tablets by mouth every 4 (four) hours as needed. 30 tablet 0   No current facility-administered medications for this visit.     Medical Decision Making:  Established Problem, Stable/Improving (1) and Review of Medication Regimen & Side Effects (2)  Treatment Plan Summary:Medication management and Plan   Patient will continue on Cymbalta 30 mg at this time but will titrate the dose to 60 mg later. She was given a prescription at this time. Advised  her to decrease the dose of Neurontin 400 mg as she will be started on Lyrica at this time.  Started her on Lyrica 50 mg for one week and then titrate to 50 mg by mouth twice a day. Discussed  with her about the side effects in detail and she just demonstrated  understanding.   She will follow-up in 1 months or earlier depending on her symptoms   More than 50% of the time spent in psychoeducation, counseling and coordination of care.    This note was generated in part or whole  with voice recognition software. Voice regonition is usually quite accurate but there are transcription errors that can and very often do occur. I apologize for any typographical errors that were not detected and corrected.     Brandy Hale, MD  09/17/2016, 10:09 AM

## 2016-09-20 ENCOUNTER — Other Ambulatory Visit: Payer: Self-pay | Admitting: Internal Medicine

## 2016-09-20 DIAGNOSIS — R102 Pelvic and perineal pain: Secondary | ICD-10-CM

## 2016-09-21 NOTE — Progress Notes (Signed)
   THERAPIST PROGRESS NOTE  Session Time: 60min  Participation Level: Active  Behavioral Response: CasualAlertIrritable  Type of Therapy: Individual Therapy  Treatment Goals addressed: Coping  Interventions: CBT, Motivational Interviewing, Solution Focused, Strength-based, Supportive, Family Systems and Reframing  Summary: Kristy Shaw is a 40 y.o. female who presents with continued symptoms of her diagnosis.   Patient was able to rate her symptoms on a 1-10 rating scale.  Patient was able to discuss why she chose a 7.  Patient has current been stressed out with her family.  She continues to be frustrated with her younger sister, mother, mother in law and her husband.  She reports arguing with her husband about bills and finances.  Therapist assisted Patient and provided additional support and feedback to address Patient's concerns regarding her relationship status with her mother and younger sister.  Therapist shifted focus of discussion to managing current symptoms, avoiding triggers improving problem solving skills.  Therapist praised Patient for her attempts to problem solve regarding her relationships concern. Suicidal/Homicidal: No  Therapist Response: Therapist initiated today's visit by probing and eliciting information from Patient about her problematic behaviors to determine her progress on the goal. Therapist educated Patient on her diagnosis and the symptoms that she displays. Therapist questioned Patient about her perspective on whether or not she's made progress on her treatment goals. Therapist facilitated a therapeutic activity with Patient to assist her with recognizing and identifying her negative thinking patterns and to assist with increasing her positive thinking skills. Therapist reviewed, explained and summarized with Patient all objectives learned and asked questions for clarification. Therapist reviewed and educated Patient on tools and coping skills to assist Patient  with working towards achieving her treatment goals.  Plan: Return again in 4 weeks.  Diagnosis: Axis I: Depression    Axis II: No diagnosis    Kristy Elkicole M Delisa Finck, LCSW 09/17/2016

## 2016-09-28 ENCOUNTER — Ambulatory Visit
Admission: RE | Admit: 2016-09-28 | Discharge: 2016-09-28 | Disposition: A | Discharge status: Home / Self Care | Payer: BLUE CROSS/BLUE SHIELD | Source: Ambulatory Visit | Attending: Internal Medicine | Admitting: Internal Medicine

## 2016-09-28 DIAGNOSIS — R938 Abnormal findings on diagnostic imaging of other specified body structures: Secondary | ICD-10-CM | POA: Insufficient documentation

## 2016-09-28 DIAGNOSIS — N839 Noninflammatory disorder of ovary, fallopian tube and broad ligament, unspecified: Secondary | ICD-10-CM | POA: Insufficient documentation

## 2016-09-28 DIAGNOSIS — R102 Pelvic and perineal pain: Secondary | ICD-10-CM

## 2016-10-12 ENCOUNTER — Ambulatory Visit: Payer: BLUE CROSS/BLUE SHIELD | Admitting: Licensed Clinical Social Worker

## 2016-10-12 ENCOUNTER — Ambulatory Visit (INDEPENDENT_AMBULATORY_CARE_PROVIDER_SITE_OTHER): Payer: BLUE CROSS/BLUE SHIELD | Admitting: Psychiatry

## 2016-10-12 ENCOUNTER — Encounter: Payer: Self-pay | Admitting: Psychiatry

## 2016-10-12 VITALS — BP 110/73 | HR 65 | Temp 98.4°F | Wt 138.6 lb

## 2016-10-12 DIAGNOSIS — M797 Fibromyalgia: Secondary | ICD-10-CM

## 2016-10-12 DIAGNOSIS — F331 Major depressive disorder, recurrent, moderate: Secondary | ICD-10-CM | POA: Diagnosis not present

## 2016-10-12 MED ORDER — GABAPENTIN 400 MG PO CAPS: 400.0000 mg | ORAL_CAPSULE | Freq: Two times a day (BID) | ORAL | 2 refills | Status: DC

## 2016-10-12 MED ORDER — DULOXETINE HCL 30 MG PO CPEP: 30.0000 mg | ORAL_CAPSULE | Freq: Every day | ORAL | 0 refills | Status: DC

## 2016-10-12 MED ORDER — GABAPENTIN 100 MG PO CAPS: ORAL_CAPSULE | ORAL | 1 refills | Status: DC

## 2016-10-12 MED ORDER — GABAPENTIN 100 MG PO CAPS: 100.0000 mg | ORAL_CAPSULE | Freq: Two times a day (BID) | ORAL | 1 refills | Status: DC

## 2016-10-12 NOTE — Progress Notes (Signed)
BH MD/PA/NP OP Progress Note  10/12/2016 10:50 AM [name] M Willmott  MRN:  [id number]  Subjective:  Patient is a 40 year old female who presented for the follow-up appointment. She reported that she is feeling very tired today due to the weather change. Patient reported that she has been compliant with her medication and she has recently seen her OB who has diagnosed her with the endemtriosis.  She has suggested that she should start taking the gabapentin in the afternoon to help with the pain. She is currently taking 800 mg at bedtime. She reported that the Cymbalta is not helping her and she was feeling better on the Effexor.The patient is interested in going back to the Effexor She reported that she does not know the reason her PCP has switched her to the Cymbalta although she is not improving on the medication. We discussed about her medications at length. Patient reported that she will first titrate the dose of gabapentin at this time and then start transitioning back to the Effexor.  She appeared calm and pleasant during the interview as usual. She reported that he continues to have restless legs at night but it has been a chronic issue and does not bother her while sleeping.She denied having any suicidal ideations or plans. She denied having any perceptual disturbances.     Chief Complaint: Chief Complaint    Follow-up; Medication Refill     Visit Diagnosis:     ICD-9-CM ICD-10-CM   1. Major depressive disorder, recurrent episode, moderate (HCC) 296.32 F33.1   2. Fibromyalgia 729.1 M79.7     Past Medical History:  Past Medical History:  Diagnosis Date  . Allergy    seasonal  . Anxiety   . Asthma   . Bipolar disorder (HCC)   . 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 Depression   . Fatigue   . Fibromyalgia   . Mitral valve prolapse   . Restless leg syndrome   . Sleep apnea     Past Surgical History:  Procedure Laterality Date  . BREAST LUMPECTOMY WITH RADIOACTIVE SEED  LOCALIZATION Left 06/07/2016   Procedure: LEFT BREAST LUMPECTOMY WITH RADIOACTIVE SEED LOCALIZATION;  Surgeon: Chevis PrettyPaul Toth III, MD;  Location: [facility];  Service: General;  Laterality: Left;  . DILATION AND CURETTAGE OF UTERUS    . NASAL SEPTUM SURGERY     Family History:  Family History  Problem Relation Age of Onset  . Hypertension Mother   . Stroke Mother   . Heart attack Father   . Stroke Father   . Depression Father   . Depression Sister   . Kidney disease Brother   . ADD / ADHD Brother   . Asthma Brother   . Breast cancer Maternal Grandmother [phone]   Social History:  Social History   Social History  . Marital status: Married    Spouse name: N/A  . Number of children: N/A  . Years of education: N/A   Social History Main Topics  . Smoking status: Current Every Day Smoker    Packs/day: 0.50    Types: Cigarettes    Start date: 09/23/1990  . Smokeless tobacco: Never Used  . Alcohol use No  . Drug use: No  . Sexual activity: Yes    Birth control/ protection: None   Other Topics Concern  . None   Social History Narrative  . None   Additional History:   Assessment:   Musculoskeletal: Strength & Muscle Tone: within normal limits Gait &  Station: normal Patient leans: N/A  Psychiatric Specialty Exam: Medication Refill     Review of Systems  Musculoskeletal: Positive for back pain.  Psychiatric/Behavioral: Positive for depression. Negative for hallucinations, memory loss, substance abuse and suicidal ideas. The patient is nervous/anxious. The patient does not have insomnia.   All other systems reviewed and are negative.   Blood pressure 110/73, pulse 65, temperature 98.4 F (36.9 C), temperature source Oral, weight 138 lb 9.6 oz (62.9 kg), last menstrual period 10/12/2016.Body mass index is 19.33 kg/m.  General Appearance: Well Groomed  Eye Contact:  Good  Speech:  Normal Rate  Volume:  Normal  Mood:  good  Affect:  Bright, smiling  Thought  Process:  Linear  Orientation:  Full (Time, Place, and Person)  Thought Content:  Negative  Suicidal Thoughts:  No  Homicidal Thoughts:  No  Memory:  Immediate;   Good Recent;   Good Remote;   Good  Judgement:  Good  Insight:  Good  Psychomotor Activity:  Negative  Concentration:  Good  Recall:  Good  Fund of Knowledge: Good  Language: Good  Akathisia:  Negative  Handed:    AIMS (if indicated):    Assets:  Communication Skills Desire for Improvement Social Support  ADL's:  Intact  Cognition: WNL  Sleep:  good   Is the patient at risk to self?  No. Has the patient been a risk to self in the past 6 months?  No. Has the patient been a risk to self within the distant past?  No. Is the patient a risk to others?  No. Has the patient been a risk to others in the past 6 months?  No. Has the patient been a risk to others within the distant past?  No.  Current Medications: Current Outpatient Prescriptions  Medication Sig Dispense Refill  . albuterol (PROAIR HFA) 108 (90 BASE) MCG/ACT inhaler Inhale into the lungs.    . cetirizine (ZYRTEC) 10 MG tablet Take 10 mg by mouth daily.    . DULoxetine (CYMBALTA) 60 MG capsule Take 1 capsule (60 mg total) by mouth daily. 30 capsule 1  . fluconazole (DIFLUCAN) 150 MG tablet Take 150 mg by mouth daily.  0  . fluticasone (FLONASE) 50 MCG/ACT nasal spray Place into both nostrils daily.    Marland Kitchen gabapentin (NEURONTIN) 400 MG capsule Take 1 capsule (400 mg total) by mouth 2 (two) times daily. 60 capsule 2  . DULoxetine (CYMBALTA) 30 MG capsule Take 1 capsule (30 mg total) by mouth daily. 15 capsule 0  . gabapentin (NEURONTIN) 100 MG capsule Take 2pills in afternoon 60 capsule 1   No current facility-administered medications for this visit.     Medical Decision Making:  Established Problem, Stable/Improving (1) and Review of Medication Regimen & Side Effects (2)  Treatment Plan Summary:Medication management and Plan   Patient will continue on  Cymbalta 60  mg at this time,  will start tapering down to 30 mg She was given a prescription at this time.  She will also start increasing the dose of Neurontin 800 mg at bedtime.   She will also take Neurontin 100-200 mg in the afternoon to help with the pain.   She reported that Lyrica  is very expensive and she is unable to afford the medication at this time.  Discussed  with her about the side effects in detail and she just demonstrated  understanding.   She will follow-up in 3 weeks or earlier depending on her symptoms  More than 50% of the time spent in psychoeducation, counseling and coordination of care.    This note was generated in part or whole with voice recognition software. Voice regonition is usually quite accurate but there are transcription errors that can and very often do occur. I apologize for any typographical errors that were not detected and corrected.     Brandy Hale, MD  10/12/2016, 10:50 AM

## 2016-10-28 ENCOUNTER — Ambulatory Visit (INDEPENDENT_AMBULATORY_CARE_PROVIDER_SITE_OTHER): Payer: BLUE CROSS/BLUE SHIELD | Admitting: Licensed Clinical Social Worker

## 2016-10-28 DIAGNOSIS — F331 Major depressive disorder, recurrent, moderate: Secondary | ICD-10-CM | POA: Diagnosis not present

## 2016-10-29 NOTE — Progress Notes (Signed)
   THERAPIST PROGRESS NOTE  Session Time: 75min  Participation Level: Active  Behavioral Response: Casual and NeatAlertDepressed  Type of Therapy: Individual Therapy  Treatment Goals addressed: Coping  Interventions: CBT, Motivational Interviewing, Solution Focused and Strength-based  Summary: Kristy Shaw is a 40 y.o. female who presents with continued symptoms of her diagnosis.  Patient was able to discuss her stressors and mood since the previous session.  Patient states that she has been angry at her husband and mother in law for lack of consideration towards her.  She was able to verbally list days and times that her mother in law was inconsiderate with changing plans.  Patient was able to discuss how she can inform her mother in law of her feelings.  Explored options to releasing the anger.  Patient reports that she and her husband are lacking communication in the relationship.   Suicidal/Homicidal: No  Therapist Response: Therapist assisted Patient with a problem-solving exercise to assist with decreasing negative feelings associated with this issue. Therapist facilitated an intervention by prompting Patient to discuss a short term goal, which in this case was also her problem. Therapist then challenged Patient to be specific regarding circumstances that make this a problem for her at this time. Therapist challenged Patient to list 10-12 solutions to her problem. Therapist then prompted Patient to evaluate the alternatives that she devised. Once Patient narrowed her options down, Therapist encouraged her to decide on a solution to her problem.   Plan: Return again in2 weeks.  Diagnosis: Axis I: Depression    Axis II: No diagnosis    Marinda Elkicole M Emiliya Chretien, LCSW 10/29/2016

## 2016-11-05 ENCOUNTER — Ambulatory Visit: Payer: BLUE CROSS/BLUE SHIELD | Admitting: Psychiatry

## 2016-11-09 ENCOUNTER — Ambulatory Visit: Payer: BLUE CROSS/BLUE SHIELD | Admitting: Licensed Clinical Social Worker

## 2016-11-15 ENCOUNTER — Ambulatory Visit: Payer: BLUE CROSS/BLUE SHIELD | Admitting: Licensed Clinical Social Worker

## 2016-11-19 ENCOUNTER — Ambulatory Visit: Payer: BLUE CROSS/BLUE SHIELD | Admitting: Psychiatry

## 2016-11-26 ENCOUNTER — Ambulatory Visit: Payer: BLUE CROSS/BLUE SHIELD | Admitting: Psychiatry

## 2016-12-19 ENCOUNTER — Other Ambulatory Visit: Payer: Self-pay | Admitting: Psychiatry

## 2017-03-07 ENCOUNTER — Ambulatory Visit (INDEPENDENT_AMBULATORY_CARE_PROVIDER_SITE_OTHER): Payer: BLUE CROSS/BLUE SHIELD | Admitting: Licensed Clinical Social Worker

## 2017-03-07 ENCOUNTER — Ambulatory Visit (INDEPENDENT_AMBULATORY_CARE_PROVIDER_SITE_OTHER): Payer: BLUE CROSS/BLUE SHIELD | Admitting: Psychiatry

## 2017-03-07 ENCOUNTER — Encounter: Payer: Self-pay | Admitting: Psychiatry

## 2017-03-07 VITALS — BP 118/72 | HR 67 | Temp 98.1°F | Wt 131.8 lb

## 2017-03-07 DIAGNOSIS — F331 Major depressive disorder, recurrent, moderate: Secondary | ICD-10-CM | POA: Diagnosis not present

## 2017-03-07 DIAGNOSIS — O4690 Antepartum hemorrhage, unspecified, unspecified trimester: Secondary | ICD-10-CM | POA: Insufficient documentation

## 2017-03-07 DIAGNOSIS — M797 Fibromyalgia: Secondary | ICD-10-CM | POA: Diagnosis not present

## 2017-03-07 DIAGNOSIS — Z8742 Personal history of other diseases of the female genital tract: Secondary | ICD-10-CM | POA: Insufficient documentation

## 2017-03-07 HISTORY — DX: Antepartum hemorrhage, unspecified, unspecified trimester: O46.90

## 2017-03-07 MED ORDER — VENLAFAXINE HCL ER 150 MG PO CP24: 150.0000 mg | ORAL_CAPSULE | Freq: Every day | ORAL | 1 refills | Status: DC

## 2017-03-07 MED ORDER — GABAPENTIN 300 MG PO CAPS: 300.0000 mg | ORAL_CAPSULE | Freq: Two times a day (BID) | ORAL | 1 refills | Status: DC

## 2017-03-07 NOTE — Progress Notes (Signed)
BH MD/PA/NP OP Progress Note  03/07/2017 2:30 PM Kristy Shaw  MRN:  [id number]  Subjective:  Patient is a 41 year old female who presented for the follow-up appointment. She was last seen in October. Patient reported that she missed her appointment. She reported that she became pregnant in January and had a miscarriage after a month due to her history of endometriosis and ovary and cyst. She reported that she was not using any contraception. Patient reported that she has been feeling tired since then. She reported that she has also tapered her out of the Neurontin before her pregnancy. She has restarted the medication again and taking 800 mg at bedtime. She has also started taking the Effexor which is not helpful. She reported that she feels anxious and depressed and wants her medications to be adjusted. She denied having any suicidal ideations or plans. She reported that her energy level is low. We discussed about taking the prenatal vitamins at this time. She reported that her husband is planning to do with the vasectomy to control the pregnancies as she thinks that she is too old to have a baby at this time. She denied using any contraceptives at this time.   Pt is  pleasant and cooperative during the interview. She is receptive to medication changes at this time.   Chief Complaint: Chief Complaint    Follow-up; Medication Refill     Visit Diagnosis:     ICD-9-CM ICD-10-CM   1. Major depressive disorder, recurrent episode, moderate (HCC) 296.32 F33.1   2. Fibromyalgia 729.1 M79.7     Past Medical History:  Past Medical History:  Diagnosis Date  . Allergy    seasonal  . Anxiety   . Asthma   . Bipolar disorder (HCC)   . 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 Depression   . Fatigue   . Fibromyalgia   . Mitral valve prolapse   . Restless leg syndrome   . Sleep apnea     Past Surgical History:  Procedure Laterality Date  . BREAST LUMPECTOMY WITH RADIOACTIVE SEED LOCALIZATION  Left 06/07/2016   Procedure: LEFT BREAST LUMPECTOMY WITH RADIOACTIVE SEED LOCALIZATION;  Surgeon: Chevis PrettyPaul Toth III, MD;  Location: [facility];  Service: General;  Laterality: Left;  . DILATION AND CURETTAGE OF UTERUS    . NASAL SEPTUM SURGERY     Family History:  Family History  Problem Relation Age of Onset  . Hypertension Mother   . Stroke Mother   . Heart attack Father   . Stroke Father   . Depression Father   . Depression Sister   . Kidney disease Brother   . ADD / ADHD Brother   . Asthma Brother   . Breast cancer Maternal Grandmother [id number]   Social History:  Social History   Social History  . Marital status: Married    Spouse name: N/A  . Number of children: N/A  . Years of education: N/A   Social History Main Topics  . Smoking status: Current Every Day Smoker    Packs/day: 0.50    Types: Cigarettes    Start date: 09/23/1990  . Smokeless tobacco: Never Used  . Alcohol use No  . Drug use: No  . Sexual activity: Yes    Birth control/ protection: None   Other Topics Concern  . None   Social History Narrative  . None   Additional History:   Assessment:   Musculoskeletal: Strength & Muscle Tone: within normal limits Gait &  Station: normal Patient leans: N/A  Psychiatric Specialty Exam: Medication Refill     Review of Systems  Musculoskeletal: Positive for back pain.  Psychiatric/Behavioral: Positive for depression. Negative for hallucinations, memory loss, substance abuse and suicidal ideas. The patient is nervous/anxious. The patient does not have insomnia.   All other systems reviewed and are negative.   Blood pressure 118/72, pulse 67, temperature 98.1 F (36.7 C), temperature source Oral, weight 131 lb 12.8 oz (59.8 kg), last menstrual period 02/17/2017.Body mass index is 18.38 kg/m.  General Appearance: Well Groomed  Eye Contact:  Good  Speech:  Normal Rate  Volume:  Normal  Mood:  good  Affect:  Bright, smiling  Thought Process:   Linear  Orientation:  Full (Time, Place, and Person)  Thought Content:  Negative  Suicidal Thoughts:  No  Homicidal Thoughts:  No  Memory:  Immediate;   Good Recent;   Good Remote;   Good  Judgement:  Good  Insight:  Good  Psychomotor Activity:  Negative  Concentration:  Good  Recall:  Good  Fund of Knowledge: Good  Language: Good  Akathisia:  Negative  Handed:    AIMS (if indicated):    Assets:  Communication Skills Desire for Improvement Social Support  ADL's:  Intact  Cognition: WNL  Sleep:  good   Is the patient at risk to self?  No. Has the patient been a risk to self in the past 6 months?  No. Has the patient been a risk to self within the distant past?  No. Is the patient a risk to others?  No. Has the patient been a risk to others in the past 6 months?  No. Has the patient been a risk to others within the distant past?  No.  Current Medications: Current Outpatient Prescriptions  Medication Sig Dispense Refill  . albuterol (PROAIR HFA) 108 (90 BASE) MCG/ACT inhaler Inhale into the lungs.    . cetirizine (ZYRTEC) 10 MG tablet Take 10 mg by mouth daily.    . fluconazole (DIFLUCAN) 150 MG tablet Take 150 mg by mouth daily.  0  . fluticasone (FLONASE) 50 MCG/ACT nasal spray Place into both nostrils daily.    Marland Kitchen venlafaxine XR (EFFEXOR-XR) 150 MG 24 hr capsule Take 1 capsule (150 mg total) by mouth daily with breakfast. 30 capsule 1  . gabapentin (NEURONTIN) 300 MG capsule Take 1 capsule (300 mg total) by mouth 2 (two) times daily. 60 capsule 1   No current facility-administered medications for this visit.     Medical Decision Making:  Established Problem, Stable/Improving (1) and Review of Medication Regimen & Side Effects (2)  Treatment Plan Summary:Medication management and Plan   I will adjust her medications as follows.  Increase Effexor XR 150 mg in the morning.  I will start her on Neurontin 300 mg by mouth twice a day. She agreed with the plan.  Follow-up  in 2 months or earlier depending on her symptoms.      More than 50% of the time spent in psychoeducation, counseling and coordination of care.    This note was generated in part or whole with voice recognition software. Voice regonition is usually quite accurate but there are transcription errors that can and very often do occur. I apologize for any typographical errors that were not detected and corrected.     Brandy Hale, MD  03/07/2017, 2:30 PM

## 2017-03-10 NOTE — Progress Notes (Signed)
   THERAPIST PROGRESS NOTE  Session Time: 75min  Participation Level: Active  Behavioral Response: Casual and NeatAlertDepressed  Type of Therapy: Individual Therapy  Treatment Goals addressed: Coping  Interventions: CBT, Motivational Interviewing, Solution Focused and Strength-based  Summary: Kristy Shaw M Gailey is a 41 y.o. female who presents with continued symptoms of her diagnosis.  Patient reports that she has difficulty with expressing her feelings as she begins to feel them.  She reports that she usuallyly "blows a gasket." Discussion of journaling or expressing her feelings in a voice recorder to reduce feeling overwhelmed with emotions.  Patient reports that she has been having difficulty with her mother in law and asked her husband to intervene.  Disussion of social outings with friends to enjoy free time.  Educated patient on the importance of self care.  Suicidal/Homicidal: No  Therapist Response: Assessed pt current functioning per pt report.  Explored w/ pt contributing factors to increased depressed mood.  Validated feelings and stressors and focused pt on using skills of reframing and conflict resolution for coping.  Plan: Return again in2 weeks.  Diagnosis: Axis I: Depression    Axis II: No diagnosis    Marinda Elkicole M Peacock, LCSW 03/10/2017

## 2017-03-17 ENCOUNTER — Ambulatory Visit (INDEPENDENT_AMBULATORY_CARE_PROVIDER_SITE_OTHER): Payer: BLUE CROSS/BLUE SHIELD | Admitting: General Surgery

## 2017-03-17 ENCOUNTER — Encounter: Payer: Self-pay | Admitting: General Surgery

## 2017-03-17 VITALS — BP 132/74 | HR 88 | Resp 12 | Ht 71.0 in | Wt 130.0 lb

## 2017-03-17 DIAGNOSIS — K621 Rectal polyp: Secondary | ICD-10-CM

## 2017-03-17 DIAGNOSIS — K620 Anal polyp: Secondary | ICD-10-CM

## 2017-03-17 NOTE — Progress Notes (Signed)
Patient ID: Kristy Shaw, female   DOB: 07/12/1976, 41 y.o.   MRN: [id number]  Chief Complaint  Patient presents with  . Rectal Problems    HPI Kristy Shaw is a 41 y.o. female here today for a evaluation of external hemorrhoids. She states she has had trouble for 8 years with the hemorrhoid coming out after her BM. She does use suppositories.  Patient states she has been having a little bleeding. She does have some discomfort that seems to be getting worse. She think she has a tiny cut as well. She uses wet wipes after a BM.   I have reviewed the history of present illness with the patient.    HPI  Past Medical History:  Diagnosis Date  . Allergy    seasonal  . Anxiety   . Asthma   . Bipolar disorder (HCC)   . Breast mass, left 2017   PASH  . 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 Depression   . Fatigue   . Fibromyalgia   . GERD (gastroesophageal reflux disease)   . IBS (irritable bowel syndrome)   . Mitral valve prolapse   . Restless leg syndrome   . Sleep apnea     Past Surgical History:  Procedure Laterality Date  . BREAST LUMPECTOMY WITH RADIOACTIVE SEED LOCALIZATION Left 06/07/2016   Procedure: LEFT BREAST LUMPECTOMY WITH RADIOACTIVE SEED LOCALIZATION;  Surgeon: Chevis PrettyPaul Toth III, MD;  Location: [facility];  Service: General;  Laterality: Left;  . DILATION AND CURETTAGE OF UTERUS    . NASAL SEPTUM SURGERY      Family History  Problem Relation Age of Onset  . Hypertension Mother   . Stroke Mother   . Heart attack Father   . Stroke Father   . Depression Father   . Depression Sister   . Kidney disease Brother   . ADD / ADHD Brother   . Asthma Brother   . Breast cancer Maternal Grandmother [id number]    Social History Social History  Substance Use Topics  . Smoking status: Current Every Day Smoker    Packs/day: 0.50    Types: Cigarettes    Start date: 09/23/1990  . Smokeless tobacco: Never Used  . Alcohol use No    Allergies  Allergen  Reactions  . Adhesive [Tape]   . Doxycycline Rash    Current Outpatient Prescriptions  Medication Sig Dispense Refill  . albuterol (PROAIR HFA) 108 (90 BASE) MCG/ACT inhaler Inhale into the lungs every 6 (six) hours as needed.     . cetirizine (ZYRTEC) 10 MG tablet Take 10 mg by mouth as needed.     . fluconazole (DIFLUCAN) 150 MG tablet Take 150 mg by mouth as needed.   0  . fluticasone (FLONASE) 50 MCG/ACT nasal spray Place into both nostrils as needed.     . gabapentin (NEURONTIN) 300 MG capsule Take 1 capsule (300 mg total) by mouth 2 (two) times daily. 60 capsule 1  . venlafaxine XR (EFFEXOR-XR) 150 MG 24 hr capsule Take 1 capsule (150 mg total) by mouth daily with breakfast. 30 capsule 1   No current facility-administered medications for this visit.     Review of Systems Review of Systems  Constitutional: Negative.   Respiratory: Negative.   Cardiovascular: Negative.     Blood pressure 132/74, pulse 88, resp. rate 12, height 5\' 11"  (1.803 m), weight 130 lb (59 kg), last menstrual period 02/17/2017.  Physical Exam Physical Exam  Constitutional: She is  oriented to person, place, and time. She appears well-developed and well-nourished.  HENT:  Mouth/Throat: Oropharynx is clear and moist.  Eyes: Conjunctivae are normal. No scleral icterus.  Neck: Neck supple.  Cardiovascular: Normal rate, regular rhythm and normal heart sounds.   Pulmonary/Chest: Effort normal and breath sounds normal.  Abdominal: Soft. Bowel sounds are normal. There is no tenderness.  Genitourinary:  Genitourinary Comments: anorectal polyp. 2cm long based inside on right side of anal canal. 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  Lymphadenopathy:    She has no cervical adenopathy.  Neurological: She is alert and oriented to person, place, and time.  Skin: Skin is warm and dry.  Psychiatric: Her behavior is normal.    Data Reviewed Prior notes  Assessment    Anorectal polyp- prolapsing and symptomatic     Plan   Recommended excision in SDS. Pt agreeable. Patient's surgery has been scheduled for 03-24-17 at [facility].      This information has been scribed by Dorathy Daft RN, BSN,BC. Marland Kitchen   SANKAR,SEEPLAPUTHUR G 03/17/2017, 5:00 PM

## 2017-03-17 NOTE — Patient Instructions (Addendum)
The patient is aware to call back for any questions or concerns.  Excision Anorectal polyp under same day surgery

## 2017-03-21 ENCOUNTER — Encounter
Admission: RE | Admit: 2017-03-21 | Discharge: 2017-03-21 | Disposition: A | Discharge status: Home / Self Care | Payer: BLUE CROSS/BLUE SHIELD | Source: Ambulatory Visit | Attending: General Surgery | Admitting: General Surgery

## 2017-03-21 HISTORY — DX: Cardiac arrhythmia, unspecified: I49.9

## 2017-03-21 HISTORY — DX: Family history of other specified conditions: Z84.89

## 2017-03-21 HISTORY — DX: Dyspnea, unspecified: R06.00

## 2017-03-21 HISTORY — DX: Anemia, unspecified: D64.9

## 2017-03-21 NOTE — Patient Instructions (Addendum)
  Your procedure is scheduled on: 03-24-17 (Thursday) Report to Same Day Surgery 2nd floor medical mall [facility] Entrance-take elevator on left to 2nd floor.  Check in with surgery information desk.) To find out your arrival time please call [phone] between 1PM - 3PM on 03-23-17 (Wednesday)  Remember: Instructions that are not followed completely may result in serious medical risk, up to and including death, or upon the discretion of your surgeon and anesthesiologist your surgery may need to be rescheduled.    _x___ 1. Do not eat food or drink liquids after midnight. No gum chewing or hard candies.     __x__ 2. No Alcohol for 24 hours before or after surgery.   __x__3. No Smoking for 24 prior to surgery.   ____  4. Bring all medications with you on the day of surgery if instructed.    __x__ 5. Notify your doctor if there is any change in your medical condition     (cold, fever, infections).     Do not wear jewelry, make-up, hairpins, clips or nail polish.  Do not wear lotions, powders, or perfumes. You may wear deodorant.  Do not shave 48 hours prior to surgery. Men may shave face and neck.  Do not bring valuables to the hospital.    [facility] is not responsible for any belongings or valuables.               Contacts, dentures or bridgework may not be worn into surgery.  Leave your suitcase in the car. After surgery it may be brought to your room.  For patients admitted to the hospital, discharge time is determined by your  treatment team.   Patients discharged the day of surgery will not be allowed to drive home.  You will need someone to drive you home and stay with you the night of your procedure.    Please read over the following fact sheets that you were given:     _x___ Take anti-hypertensive (unless it includes a diuretic), cardiac, seizure, asthma,     anti-reflux and psychiatric medicines. These include:  1. GABAPENTIN (NEURONTIN)  2. EFFEXOR (VENLAFAXINE)  3.  PRILOSEC  4. TAKE A PRILOSEC ON Wednesday NIGHT BEFORE BED  5.  6.  _X___Fleets enema or Magnesium Citrate as directed-DO FLEETS ENEMA AT HOME 1 HOUR PRIOR TO ARRIVAL TIME TO HOSPITAL  ____ Use CHG Soap or sage wipes as directed on instruction sheet   _X___ Use inhalers on the day of surgery and bring to hospital day of surgery-USE ALBUTEROL INHALER AT HOME AND BRING TO HOSPITAL  ____ Stop Metformin and Janumet 2 days prior to surgery.    ____ Take 1/2 of usual insulin dose the night before surgery and none on the morning     surgery.   ____ Follow recommendations from Cardiologist, Pulmonologist or PCP regarding stopping Aspirin, Coumadin, Pllavix ,Eliquis, Effient, or Pradaxa, and Pletal.  X____Stop Anti-inflammatories such as Advil, Aleve, Ibuprofen, Motrin, Naproxen, Naprosyn, Goodies powders or aspirin products NOW-OK to take Tylenol   ____ Stop supplements until after surgery.  But may continue Vitamin D, Vitamin B,       and multivitamin.   _X___ Bring C-Pap to the hospital.

## 2017-03-22 ENCOUNTER — Encounter
Admission: RE | Admit: 2017-03-22 | Discharge: 2017-03-22 | Disposition: A | Discharge status: Home / Self Care | Payer: BLUE CROSS/BLUE SHIELD | Source: Ambulatory Visit | Attending: General Surgery | Admitting: General Surgery

## 2017-03-22 DIAGNOSIS — M797 Fibromyalgia: Secondary | ICD-10-CM | POA: Diagnosis not present

## 2017-03-22 DIAGNOSIS — F17210 Nicotine dependence, cigarettes, uncomplicated: Secondary | ICD-10-CM | POA: Diagnosis not present

## 2017-03-22 DIAGNOSIS — F329 Major depressive disorder, single episode, unspecified: Secondary | ICD-10-CM | POA: Diagnosis not present

## 2017-03-22 DIAGNOSIS — J45909 Unspecified asthma, uncomplicated: Secondary | ICD-10-CM | POA: Diagnosis not present

## 2017-03-22 DIAGNOSIS — Z79899 Other long term (current) drug therapy: Secondary | ICD-10-CM | POA: Diagnosis not present

## 2017-03-22 DIAGNOSIS — F419 Anxiety disorder, unspecified: Secondary | ICD-10-CM | POA: Diagnosis not present

## 2017-03-22 DIAGNOSIS — K219 Gastro-esophageal reflux disease without esophagitis: Secondary | ICD-10-CM | POA: Diagnosis not present

## 2017-03-22 DIAGNOSIS — G4730 Sleep apnea, unspecified: Secondary | ICD-10-CM | POA: Diagnosis not present

## 2017-03-22 DIAGNOSIS — K621 Rectal polyp: Secondary | ICD-10-CM | POA: Diagnosis present

## 2017-03-23 ENCOUNTER — Encounter: Payer: Self-pay | Admitting: *Deleted

## 2017-03-24 ENCOUNTER — Encounter
Admission: RE | Disposition: A | Discharge status: Home / Self Care | Payer: Self-pay | Source: Ambulatory Visit | Attending: General Surgery

## 2017-03-24 ENCOUNTER — Ambulatory Visit
Admission: RE | Admit: 2017-03-24 | Discharge: 2017-03-24 | Disposition: A | Discharge status: Home / Self Care | Payer: BLUE CROSS/BLUE SHIELD | Source: Ambulatory Visit | Attending: General Surgery | Admitting: General Surgery

## 2017-03-24 ENCOUNTER — Encounter: Payer: Self-pay | Admitting: *Deleted

## 2017-03-24 ENCOUNTER — Ambulatory Visit: Payer: BLUE CROSS/BLUE SHIELD | Admitting: Anesthesiology

## 2017-03-24 DIAGNOSIS — G4730 Sleep apnea, unspecified: Secondary | ICD-10-CM | POA: Insufficient documentation

## 2017-03-24 DIAGNOSIS — F17210 Nicotine dependence, cigarettes, uncomplicated: Secondary | ICD-10-CM | POA: Insufficient documentation

## 2017-03-24 DIAGNOSIS — F419 Anxiety disorder, unspecified: Secondary | ICD-10-CM | POA: Insufficient documentation

## 2017-03-24 DIAGNOSIS — J45909 Unspecified asthma, uncomplicated: Secondary | ICD-10-CM | POA: Insufficient documentation

## 2017-03-24 DIAGNOSIS — K219 Gastro-esophageal reflux disease without esophagitis: Secondary | ICD-10-CM | POA: Insufficient documentation

## 2017-03-24 DIAGNOSIS — K620 Anal polyp: Secondary | ICD-10-CM | POA: Diagnosis not present

## 2017-03-24 DIAGNOSIS — Z79899 Other long term (current) drug therapy: Secondary | ICD-10-CM | POA: Insufficient documentation

## 2017-03-24 DIAGNOSIS — K621 Rectal polyp: Secondary | ICD-10-CM | POA: Diagnosis not present

## 2017-03-24 DIAGNOSIS — F329 Major depressive disorder, single episode, unspecified: Secondary | ICD-10-CM | POA: Insufficient documentation

## 2017-03-24 DIAGNOSIS — M797 Fibromyalgia: Secondary | ICD-10-CM | POA: Insufficient documentation

## 2017-03-24 HISTORY — PX: HEMORRHOID SURGERY: SHX153

## 2017-03-24 LAB — URINE DRUG SCREEN, QUALITATIVE (ARMC ONLY)
AMPHETAMINES, UR SCREEN: NOT DETECTED
Barbiturates, Ur Screen: NOT DETECTED
Benzodiazepine, Ur Scrn: NOT DETECTED
COCAINE METABOLITE, UR ~~LOC~~: NOT DETECTED
Cannabinoid 50 Ng, Ur ~~LOC~~: POSITIVE — AB
MDMA (ECSTASY) UR SCREEN: NOT DETECTED
METHADONE SCREEN, URINE: NOT DETECTED
Opiate, Ur Screen: NOT DETECTED
Phencyclidine (PCP) Ur S: NOT DETECTED
TRICYCLIC, UR SCREEN: NOT DETECTED

## 2017-03-24 LAB — POCT PREGNANCY, URINE: PREG TEST UR: NEGATIVE

## 2017-03-24 SURGERY — HEMORRHOIDECTOMY
Anesthesia: General | Wound class: Clean Contaminated

## 2017-03-24 MED ORDER — BACITRACIN ZINC 500 UNIT/GM EX OINT
TOPICAL_OINTMENT | CUTANEOUS | Status: AC
  Filled 2017-03-24: qty 28.35

## 2017-03-24 MED ORDER — BUPIVACAINE HCL (PF) 0.5 % IJ SOLN
INTRAMUSCULAR | Status: DC | PRN
  Administered 2017-03-24: 24 mL

## 2017-03-24 MED ORDER — PROPOFOL 10 MG/ML IV BOLUS
INTRAVENOUS | Status: DC | PRN
  Administered 2017-03-24: 180 mg via INTRAVENOUS

## 2017-03-24 MED ORDER — LACTATED RINGERS IV SOLN
INTRAVENOUS | Status: DC
  Administered 2017-03-24 (×2): via INTRAVENOUS

## 2017-03-24 MED ORDER — TRAMADOL HCL 50 MG PO TABS: 50.0000 mg | ORAL_TABLET | Freq: Four times a day (QID) | ORAL | 0 refills | Status: DC | PRN

## 2017-03-24 MED ORDER — BACITRACIN ZINC 500 UNIT/GM EX OINT
TOPICAL_OINTMENT | CUTANEOUS | Status: DC | PRN
  Administered 2017-03-24: 1 via TOPICAL

## 2017-03-24 MED ORDER — BUPIVACAINE LIPOSOME 1.3 % IJ SUSP
INTRAMUSCULAR | Status: AC
Start: 2017-03-24 — End: 2017-03-24
  Filled 2017-03-24: qty 20

## 2017-03-24 MED ORDER — ONDANSETRON HCL 4 MG/2ML IJ SOLN
INTRAMUSCULAR | Status: DC | PRN
  Administered 2017-03-24: 4 mg via INTRAVENOUS

## 2017-03-24 MED ORDER — MIDAZOLAM HCL 2 MG/2ML IJ SOLN
INTRAMUSCULAR | Status: DC | PRN
  Administered 2017-03-24: 2 mg via INTRAVENOUS

## 2017-03-24 MED ORDER — BUPIVACAINE HCL (PF) 0.5 % IJ SOLN
INTRAMUSCULAR | Status: AC
  Filled 2017-03-24: qty 30

## 2017-03-24 MED ORDER — FENTANYL CITRATE (PF) 100 MCG/2ML IJ SOLN: 25.0000 ug | INTRAMUSCULAR | Status: DC | PRN

## 2017-03-24 MED ORDER — FLEET ENEMA 7-19 GM/118ML RE ENEM: 1.0000 | ENEMA | Freq: Once | RECTAL | Status: DC

## 2017-03-24 MED ORDER — LIDOCAINE HCL (CARDIAC) 20 MG/ML IV SOLN
INTRAVENOUS | Status: DC | PRN
  Administered 2017-03-24: 100 mg via INTRAVENOUS

## 2017-03-24 MED ORDER — FENTANYL CITRATE (PF) 100 MCG/2ML IJ SOLN
INTRAMUSCULAR | Status: DC | PRN
  Administered 2017-03-24 (×2): 50 ug via INTRAVENOUS

## 2017-03-24 MED ORDER — ONDANSETRON HCL 4 MG/2ML IJ SOLN: 4.0000 mg | Freq: Once | INTRAMUSCULAR | Status: DC | PRN

## 2017-03-24 MED ORDER — FENTANYL CITRATE (PF) 100 MCG/2ML IJ SOLN
INTRAMUSCULAR | Status: AC
  Filled 2017-03-24: qty 2

## 2017-03-24 MED ORDER — SODIUM CHLORIDE 0.9 % IJ SOLN
INTRAMUSCULAR | Status: AC
  Filled 2017-03-24: qty 50

## 2017-03-24 MED ORDER — MIDAZOLAM HCL 2 MG/2ML IJ SOLN
INTRAMUSCULAR | Status: AC
  Filled 2017-03-24: qty 2

## 2017-03-24 SURGICAL SUPPLY — 25 items
BLADE SURG 15 STRL SS SAFETY (BLADE) ×3 IMPLANT
BRIEF STRETCH MATERNITY 2XLG (MISCELLANEOUS) ×3 IMPLANT
CANISTER SUCT 1200ML W/VALVE (MISCELLANEOUS) ×3 IMPLANT
DRAPE LAPAROTOMY 77X122 PED (DRAPES) ×3 IMPLANT
DRAPE LEGGINS SURG 28X43 STRL (DRAPES) ×3 IMPLANT
DRAPE UNDER BUTTOCK W/FLU (DRAPES) ×3 IMPLANT
ELECT REM PT RETURN 9FT ADLT (ELECTROSURGICAL) ×3
ELECTRODE REM PT RTRN 9FT ADLT (ELECTROSURGICAL) ×1 IMPLANT
GLOVE BIO SURGEON STRL SZ7 (GLOVE) ×21 IMPLANT
GOWN STRL REUS W/ TWL LRG LVL3 (GOWN DISPOSABLE) ×4 IMPLANT
GOWN STRL REUS W/TWL LRG LVL3 (GOWN DISPOSABLE) ×8
KIT RM TURNOVER STRD PROC AR (KITS) ×3 IMPLANT
LABEL OR SOLS (LABEL) ×3 IMPLANT
LIGASURE IMPACT 36 18CM CVD LR (INSTRUMENTS) IMPLANT
NEEDLE HYPO 25X1 1.5 SAFETY (NEEDLE) ×3 IMPLANT
NS IRRIG 500ML POUR BTL (IV SOLUTION) ×3 IMPLANT
PACK BASIN MINOR ARMC (MISCELLANEOUS) ×3 IMPLANT
PAD OB MATERNITY 4.3X12.25 (PERSONAL CARE ITEMS) ×3 IMPLANT
PAD PREP 24X41 OB/GYN DISP (PERSONAL CARE ITEMS) ×3 IMPLANT
SOL PREP PVP 2OZ (MISCELLANEOUS) ×3
SOLUTION PREP PVP 2OZ (MISCELLANEOUS) ×1 IMPLANT
SURGILUBE 2OZ TUBE FLIPTOP (MISCELLANEOUS) ×3 IMPLANT
SUT MNCRL 3-0 UNDYED SH (SUTURE) ×1 IMPLANT
SUT MONOCRYL 3-0 UNDYED (SUTURE) ×2
SYR CONTROL 10ML (SYRINGE) ×3 IMPLANT

## 2017-03-24 NOTE — Op Note (Signed)
Preop diagnosis: Anorectal polyp  Post op diagnosis: Same  Operation: Excision and rectal polyp  Surgeon: Kathreen Cosier  Assistant:     Anesthesia: Gen.  Complications: None  EBL: Less than milliliter  Drains: None  Description: Patient was put to sleep with an LMA and then placed in lithotomy. The anal area was prepped and draped as sterile field and timeout performed. Digital exam and speculum examination was then completed. 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 This was based on the posterior aspect. 0.5% Marcaine totaling 20 mL was instilled in the perianal region and the intersphincteric and submucous layer along the posterior half of the anal canal. The polyp was then adequately exposed and at the base was suture ligated with the 3-0 Monocryl and polyp removed. No other pathology was identified on exam or by digital palpation. The anterolateral rectal region was covered with bacitracin ointment. Patient subsequently was reversed taken down from lithotomy and returned recovery room in stable condition

## 2017-03-24 NOTE — Interval H&P Note (Signed)
History and Physical Interval Note:  03/24/2017 10:43 AM  Kristy Shaw  has presented today for surgery, with the diagnosis of ANORECTAL POLYP  The various methods of treatment have been discussed with the patient and family. After consideration of risks, benefits and other options for treatment, the patient has consented to  Procedure(s): EXCISION ANORECTAL POLYP (N/A) as a surgical intervention .  The patient's history has been reviewed, patient examined, no change in status, stable for surgery.  I have reviewed the patient's chart and labs.  Questions were answered to the patient's satisfaction.     SANKAR,SEEPLAPUTHUR G

## 2017-03-24 NOTE — OR Nursing (Signed)
POCT urine was negative for pregnancy.

## 2017-03-24 NOTE — Anesthesia Postprocedure Evaluation (Signed)
Anesthesia Post Note  Patient: Kristy Shaw  Procedure(s) Performed: Procedure(s) (LRB): EXCISION ANORECTAL POLYP (N/A)  Patient location during evaluation: PACU Anesthesia Type: General Level of consciousness: awake and alert and oriented Pain management: pain level controlled Vital Signs Assessment: post-procedure vital signs reviewed and stable Respiratory status: spontaneous breathing Cardiovascular status: blood pressure returned to baseline Anesthetic complications: no     Last Vitals:  Vitals:   03/24/17 1351 03/24/17 1409  BP: 112/64 121/83  Pulse: 60 60  Resp: 16 15  Temp: 37.1 C 36.8 C    Last Pain:  Vitals:   03/24/17 1409  TempSrc: Oral  PainSc: 0-No pain                 Natania Finigan

## 2017-03-24 NOTE — Anesthesia Post-op Follow-up Note (Cosign Needed)
Anesthesia QCDR form completed.        

## 2017-03-24 NOTE — Anesthesia Preprocedure Evaluation (Signed)
Anesthesia Evaluation  Patient identified by MRN, date of birth, ID band Patient awake    Reviewed: Allergy & Precautions, NPO status , Patient's Chart, lab work & pertinent test results  History of Anesthesia Complications (+) Family history of anesthesia reactionNegative for: history of anesthetic complications  Airway Mallampati: I  TM Distance: >3 FB Neck ROM: Full    Dental  (+) Dental Advisory Given   Pulmonary shortness of breath and with exertion, asthma , sleep apnea and Continuous Positive Airway Pressure Ventilation , Current Smoker,    breath sounds clear to auscultation       Cardiovascular negative cardio ROS  + Valvular Problems/Murmurs (no MR) MVP  Rhythm:Regular Rate:Normal     Neuro/Psych PSYCHIATRIC DISORDERS Anxiety Depression Bipolar Disorder negative neurological ROS     GI/Hepatic negative GI ROS, Neg liver ROS, PUD, GERD  Medicated and Controlled,  Endo/Other  negative endocrine ROS  Renal/GU negative Renal ROS     Musculoskeletal  (+) Fibromyalgia -  Abdominal   Peds negative pediatric ROS (+)  Hematology negative hematology ROS (+) anemia ,   Anesthesia Other Findings   Reproductive/Obstetrics LMP 05/27/16                             Anesthesia Physical  Anesthesia Plan  ASA: III  Anesthesia Plan: General   Post-op Pain Management:    Induction: Intravenous  Airway Management Planned: LMA  Additional Equipment:   Intra-op Plan:   Post-operative Plan:   Informed Consent: I have reviewed the patients History and Physical, chart, labs and discussed the procedure including the risks, benefits and alternatives for the proposed anesthesia with the patient or authorized representative who has indicated his/her understanding and acceptance.   Dental advisory given  Plan Discussed with: CRNA and Surgeon  Anesthesia Plan Comments: (Plan routine monitors,  GA- LMA OK)        Anesthesia Quick Evaluation

## 2017-03-24 NOTE — Transfer of Care (Signed)
Immediate Anesthesia Transfer of Care Note  Patient: Kristy Shaw  Procedure(s) Performed: Procedure(s): EXCISION ANORECTAL POLYP (N/A)  Patient Location: PACU  Anesthesia Type:General  Level of Consciousness: sedated  Airway & Oxygen Therapy: Patient Spontanous Breathing and Patient connected to face mask oxygen  Post-op Assessment: Report given to RN and Post -op Vital signs reviewed and stable  Post vital signs: Reviewed and stable  Last Vitals:  Vitals:   03/24/17 1103 03/24/17 1322  BP: (!) 114/59 (!) 142/78  Pulse: 63 73  Resp: 16 10  Temp: 36.8 C 36.9 C    Last Pain:  Vitals:   03/24/17 1103  TempSrc: Oral  PainSc: 0-No pain         Complications: No apparent anesthesia complications

## 2017-03-24 NOTE — Discharge Instructions (Signed)

## 2017-03-24 NOTE — H&P (View-Only) (Signed)
Patient ID: Kristy Shaw, female   DOB: [birth year], 41 y.o.   MRN: [id number]  Chief Complaint  Patient presents with  . Rectal Problems    HPI [name] is a 41 y.o. female here today for a evaluation of external hemorrhoids. She states she has had trouble for 8 years with the hemorrhoid coming out after her BM. She does use suppositories.  Patient states she has been having a little bleeding. She does have some discomfort that seems to be getting worse. She think she has a tiny cut as well. She uses wet wipes after a BM.   I have reviewed the history of present illness with the patient.    HPI  Past Medical History:  Diagnosis Date  . Allergy    seasonal  . Anxiety   . Asthma   . Bipolar disorder (HCC)   . Breast mass, left 2017   PASH  . 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 Depression   . Fatigue   . Fibromyalgia   . GERD (gastroesophageal reflux disease)   . IBS (irritable bowel syndrome)   . Mitral valve prolapse   . Restless leg syndrome   . Sleep apnea     Past Surgical History:  Procedure Laterality Date  . BREAST LUMPECTOMY WITH RADIOACTIVE SEED LOCALIZATION Left 06/07/2016   Procedure: LEFT BREAST LUMPECTOMY WITH RADIOACTIVE SEED LOCALIZATION;  Surgeon: [name], MD;  Location: [facility];  Service: General;  Laterality: Left;  . DILATION AND CURETTAGE OF UTERUS    . NASAL SEPTUM SURGERY      Family History  Problem Relation Age of Onset  . Hypertension Mother   . Stroke Mother   . Heart attack Father   . Stroke Father   . Depression Father   . Depression Sister   . Kidney disease Brother   . ADD / ADHD Brother   . Asthma Brother   . Breast cancer Maternal Grandmother 34    Social History Social History  Substance Use Topics  . Smoking status: Current Every Day Smoker    Packs/day: 0.50    Types: Cigarettes    Start date: 09/23/1990  . Smokeless tobacco: Never Used  . Alcohol use No    Allergies  Allergen  Reactions  . Adhesive [Tape]   . Doxycycline Rash    Current Outpatient Prescriptions  Medication Sig Dispense Refill  . albuterol (PROAIR HFA) 108 (90 BASE) MCG/ACT inhaler Inhale into the lungs every 6 (six) hours as needed.     . cetirizine (ZYRTEC) 10 MG tablet Take 10 mg by mouth as needed.     . fluconazole (DIFLUCAN) 150 MG tablet Take 150 mg by mouth as needed.   0  . fluticasone (FLONASE) 50 MCG/ACT nasal spray Place into both nostrils as needed.     . gabapentin (NEURONTIN) 300 MG capsule Take 1 capsule (300 mg total) by mouth 2 (two) times daily. 60 capsule 1  . venlafaxine XR (EFFEXOR-XR) 150 MG 24 hr capsule Take 1 capsule (150 mg total) by mouth daily with breakfast. 30 capsule 1   No current facility-administered medications for this visit.     Review of Systems Review of Systems  Constitutional: Negative.   Respiratory: Negative.   Cardiovascular: Negative.     Blood pressure 132/74, pulse 88, resp. rate 12, height  (1.803 m), weight 130 lb (59 kg), last menstrual period 02/17/2017.  Physical Exam Physical Exam  Constitutional: She is  oriented to person, place, and time. She appears well-developed and well-nourished.  HENT:  Mouth/Throat: Oropharynx is clear and moist.  Eyes: Conjunctivae are normal. No scleral icterus.  Neck: Neck supple.  Cardiovascular: Normal rate, regular rhythm and normal heart sounds.   Pulmonary/Chest: Effort normal and breath sounds normal.  Abdominal: Soft. Bowel sounds are normal. There is no tenderness.  Genitourinary:  Genitourinary Comments: anorectal polyp. 2cm long based inside on right side of anal canal. 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  Lymphadenopathy:    She has no cervical adenopathy.  Neurological: She is alert and oriented to person, place, and time.  Skin: Skin is warm and dry.  Psychiatric: Her behavior is normal.    Data Reviewed Prior notes  Assessment    Anorectal polyp- prolapsing and symptomatic     Plan   Recommended excision in SDS. Pt agreeable. Patient's surgery has been scheduled for 03-24-17 at [facility].      This information has been scribed by Dorathy Daft RN, BSN,BC. Marland Kitchen   Annalea Alguire G 03/17/2017, 5:00 PM

## 2017-03-24 NOTE — Anesthesia Procedure Notes (Signed)
Procedure Name: LMA Insertion Date/Time: 03/24/2017 12:31 PM Performed by: Almeta Monas Pre-anesthesia Checklist: Patient identified, Emergency Drugs available, Suction available and Patient being monitored Patient Re-evaluated:Patient Re-evaluated prior to inductionOxygen Delivery Method: Circle system utilized Preoxygenation: Pre-oxygenation with 100% oxygen Intubation Type: IV induction Ventilation: Mask ventilation without difficulty LMA: LMA inserted LMA Size: 3.5 Number of attempts: 1 Placement Confirmation: positive ETCO2 and breath sounds checked- equal and bilateral Dental Injury: Teeth and Oropharynx as per pre-operative assessment

## 2017-03-25 ENCOUNTER — Encounter: Payer: Self-pay | Admitting: General Surgery

## 2017-03-25 LAB — SURGICAL PATHOLOGY

## 2017-03-29 ENCOUNTER — Telehealth: Payer: Self-pay

## 2017-03-29 MED ORDER — DIBUCAINE 1 % RE OINT: 1.0000 "application " | TOPICAL_OINTMENT | RECTAL | 0 refills | Status: DC | PRN

## 2017-03-29 NOTE — Telephone Encounter (Signed)
Per Dr Evette Cristal informed patient pathology was fine, scant internal hemorrhoid. He has ordered Nupercainal ointment for her to use as needed until seen on Friday. The patient expresses understanding and will call back with any further questions.

## 2017-03-29 NOTE — Addendum Note (Signed)
Addended by: Sinda Du on: 03/29/2017 01:46 PM   Modules accepted: Orders

## 2017-03-29 NOTE — Telephone Encounter (Signed)
Patient had surgery on 03/24/17, she called and states she is having a lot of burning and stinging right at her rectum. She reports this pain as constant, like razor blades. She has been using the Toradol and then Ibuprofen without any relief. She has been using sitz baths, but the burning returns right after she is done. No problems with moving her bowels. She has tried Orajel to the area with some relief, but this does not last long. She is scheduled to follow up on 04/02/17, but would like to be seen sooner.

## 2017-03-31 ENCOUNTER — Ambulatory Visit: Payer: BLUE CROSS/BLUE SHIELD | Admitting: General Surgery

## 2017-04-01 ENCOUNTER — Ambulatory Visit (INDEPENDENT_AMBULATORY_CARE_PROVIDER_SITE_OTHER): Payer: BLUE CROSS/BLUE SHIELD | Admitting: General Surgery

## 2017-04-01 ENCOUNTER — Encounter: Payer: Self-pay | Admitting: General Surgery

## 2017-04-01 VITALS — BP 128/80 | HR 82 | Resp 12 | Ht 71.0 in | Wt 131.0 lb

## 2017-04-01 DIAGNOSIS — K602 Anal fissure, unspecified: Secondary | ICD-10-CM

## 2017-04-01 NOTE — Progress Notes (Signed)
Patient ID: Kristy Shaw, female   DOB: 1976-09-03, 41 y.o.   MRN: [id number]  Chief Complaint  Patient presents with  . Routine Post Op    HPI Kristy Shaw is a 41 y.o. female she states that she feels like she has a "hemorrhoid or something" right at the rectal area. She states the area is painful and swollen and is not sure what it may be. No bleeding reported. Bowel movements have been regular but painful. She has been using tylenol and ibuprofen for pain relief. She has been using the Nupercainal ointment with very little relief.HPI    Past Medical History:  Diagnosis Date  . Allergy    seasonal  . Anemia    LAST HGB 12.8 ON 01-31-17  . Anxiety   . Asthma    WELL CONTROLLED  . Bipolar disorder (HCC)   . Breast mass, left 2017   PASH  . 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 Depression   . Dyspnea   . Dysrhythmia    IRREGULAR HEART BEAT  . Family history of adverse reaction to anesthesia    DAD-STOKE COMING OUT OF ANESTHESIA   . Fatigue   . Fibromyalgia   . GERD (gastroesophageal reflux disease)    OCC  . IBS (irritable bowel syndrome)   . Mitral valve prolapse   . Restless leg syndrome   . Sleep apnea    HAS CPAP MACHINE BUT DOES NOT USE ON A REGULAR BASIS    Past Surgical History:  Procedure Laterality Date  . BREAST LUMPECTOMY WITH RADIOACTIVE SEED LOCALIZATION Left 06/07/2016   Procedure: LEFT BREAST LUMPECTOMY WITH RADIOACTIVE SEED LOCALIZATION;  Surgeon: [name], MD;  Location: [facility];  Service: General;  Laterality: Left;  . DILATION AND CURETTAGE OF UTERUS    . HEMORRHOID SURGERY N/A 03/24/2017   Procedure: EXCISION ANORECTAL POLYP;  Surgeon: Kieth Brightly, MD;  Location: [facility];  Service: General;  Laterality: N/A;  . NASAL SEPTUM SURGERY      Family History  Problem Relation Age of Onset  . Hypertension Mother   . Stroke Mother   . Heart attack Father   . Stroke Father   . Depression Father   . Depression  Sister   . Kidney disease Brother   . ADD / ADHD Brother   . Asthma Brother   . Breast cancer Maternal Grandmother 29    Social History Social History  Substance Use Topics  . Smoking status: Current Every Day Smoker    Packs/day: 0.50    Years: 26.00    Types: Cigarettes    Start date: 09/23/1990  . Smokeless tobacco: Never Used  . Alcohol use No    Allergies  Allergen Reactions  . Adhesive [Tape]   . Doxycycline Rash    Current Outpatient Prescriptions  Medication Sig Dispense Refill  . albuterol (PROAIR HFA) 108 (90 BASE) MCG/ACT inhaler Inhale into the lungs every 6 (six) hours as needed.     . cetirizine (ZYRTEC) 10 MG tablet Take 10 mg by mouth as needed.     . dibucaine (NUPERCAINAL) 1 % OINT Place 1 application rectally as needed for pain. 1 Tube 0  . fluconazole (DIFLUCAN) 150 MG tablet Take 150 mg by mouth as needed.   0  . fluticasone (FLONASE) 50 MCG/ACT nasal spray Place into both nostrils as needed.     . gabapentin (NEURONTIN) 300 MG capsule Take 1 capsule (300 mg total) by  mouth 2 (two) times daily. 60 capsule 1  . omeprazole (PRILOSEC) 20 MG capsule Take 20 mg by mouth as needed.    . venlafaxine XR (EFFEXOR-XR) 150 MG 24 hr capsule Take 1 capsule (150 mg total) by mouth daily with breakfast. 30 capsule 1   No current facility-administered medications for this visit.     Review of Systems Review of Systems  Constitutional: Negative.   Respiratory: Negative.   Cardiovascular: Negative.   Gastrointestinal: Positive for diarrhea and rectal pain. Negative for abdominal distention, abdominal pain, anal bleeding, blood in stool, constipation, nausea and vomiting.    Blood pressure 128/80, pulse 82, resp. rate 12, height  (1.803 m), weight 131 lb (59.4 kg), last menstrual period 03/18/2017.  Physical Exam Physical Exam  Constitutional: She is oriented to person, place, and time. She appears well-developed and well-nourished.  Cardiovascular: Normal  rate, regular rhythm and normal heart sounds.   Pulmonary/Chest: Effort normal and breath sounds normal.  Genitourinary: Rectal exam shows fissure.  Neurological: She is alert and oriented to person, place, and time.  Skin: Skin is dry.  Psychiatric: Her behavior is normal.  Small fissure on posterior mucosa of anus, likely due to dilatation of anus during hemorrhoidectomy.   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  Pre op EKG was abnormal, showing left ventricular hypertrophy. She has known mitral valve prolapse.  Assessment    Small anal fissure, about 1 cm, on posterior mucosa of anus. Mild swelling, not purulent.  Symptomatically warrants intervention.     Plan    Discussed medical options including nitroglycerine and botox. Discussed details of sphincterectomy with patient and she was agreeable.   Advised patient to bring copy of EKG to her cardiologist for review.     The patient is scheduled for surgery at [facility] on 04/05/17. She will pre admit by phone. The patient is aware of date and instructions.    HPI, Physical Exam, Assessment and Plan have been scribed under the direction and in the presence of Kathreen Cosier, MD  Carron Brazen, LPN   I have completed the exam and reviewed the above documentation for accuracy and completeness.  I agree with the above.  Museum/gallery conservator has been used and any errors in dictation or transcription are unintentional.  Woodward Klem G. Evette Cristal, M.D., F.A.C.S.  Gerlene Burdock G 04/01/2017, 10:44 AM

## 2017-04-01 NOTE — Patient Instructions (Addendum)
Anal Fissure, Adult An anal fissure is a small tear or crack in the skin around the anus. Bleeding from a fissure usually stops on its own within a few minutes. However, bleeding will often occur again with each bowel movement until the crack heals. What are the causes? This condition may be caused by:  Passing large, hard stool (feces).  Frequent diarrhea.  Constipation.  Inflammatory bowel disease (Crohn disease or ulcerative colitis).  Infections.  Anal sex. What are the signs or symptoms? Symptoms of this condition include:  Bleeding from the rectum.  Small amounts of blood seen on your stool, on toilet paper, or in the toilet after a bowel movement.  Painful bowel movements.  Itching or irritation around the anus. How is this diagnosed? A health care provider may diagnose this condition by closely examining the anal area. An anal fissure can usually be seen with careful inspection. In some cases, a rectal exam may be performed, or a short tube (anoscope) may be used to examine the anal canal. How is this treated? Treatment for this condition may include:  Taking steps to avoid constipation. This may include making changes to your diet, such as increasing your intake of fiber or fluid.  Taking fiber supplements. These supplements can soften your stool to help make bowel movements easier. Your health care provider may also prescribe a stool softener if your stool is often hard.  Taking sitz baths. This may help to heal the tear.  Using medicated creams or ointments. These may be prescribed to lessen discomfort. Follow these instructions at home: Eating and drinking   Avoid foods that may be constipating, such as bananas and dairy products.  Drink enough fluid to keep your urine clear or pale yellow.  Maintain a diet that is high in fruits, whole grains, and vegetables. General instructions   Keep the anal area as clean and dry as possible.  Take sitz baths as told  by your health care provider. Do not use soap in the sitz baths.  Take over-the-counter and prescription medicines only as told by your health care provider.  Use creams or ointments only as told by your health care provider.  Keep all follow-up visits as told by your health care provider. This is important. Contact a health care provider if:  You have more bleeding.  You have a fever.  You have diarrhea that is mixed with blood.  You continue to have pain.  Your problem is getting worse rather than better. This information is not intended to replace advice given to you by your health care provider. Make sure you discuss any questions you have with your health care provider. Document Released: 12/06/2005 Document Revised: 04/14/2016 Document Reviewed: 03/03/2015 Elsevier Interactive Patient Education  2017 [employer].  The patient is scheduled for surgery at [facility] on 04/05/17. She will pre admit by phone. The patient is aware of date and instructions.   Advised patient to bring copy of EKG to her cardiologist for review.

## 2017-04-04 ENCOUNTER — Telehealth: Payer: Self-pay | Admitting: *Deleted

## 2017-04-04 NOTE — Telephone Encounter (Signed)
Patient called the office today apprehensive to proceed with surgery that is scheduled for tomorrow, 04-05-17 at [facility].  This patient has requested that Dr. Evette Cristal give her a phone call after 5 pm today due to another appointment that she has scheduled.   Surgery will be left on the schedule for now until Dr. Evette Cristal speaks with the patient.

## 2017-04-05 ENCOUNTER — Encounter: Admission: RE | Payer: Self-pay | Source: Ambulatory Visit

## 2017-04-05 ENCOUNTER — Telehealth: Payer: Self-pay | Admitting: General Surgery

## 2017-04-05 ENCOUNTER — Ambulatory Visit
Admission: RE | Admit: 2017-04-05 | Payer: BLUE CROSS/BLUE SHIELD | Source: Ambulatory Visit | Admitting: General Surgery

## 2017-04-05 SURGERY — SPHINCTEROTOMY, ANAL
Anesthesia: Choice

## 2017-04-05 MED ORDER — CEFAZOLIN SODIUM-DEXTROSE 2-4 GM/100ML-% IV SOLN: 2.0000 g | INTRAVENOUS | Status: DC

## 2017-04-05 NOTE — Telephone Encounter (Signed)
Pt was called last evening per her request. She decided to hold off surgery for anal fissure. She states it is feeling a little better. She will call if any worsening symptoms. Asked she follow up with me next week.

## 2017-04-06 ENCOUNTER — Telehealth: Payer: Self-pay | Admitting: *Deleted

## 2017-04-06 NOTE — Telephone Encounter (Signed)
Patient called the office back and an appointment was scheduled for patient to be seen on 04-12-17 at 11:30 am with Dr. Evette Cristal to discuss further options for management of anal fissure.

## 2017-04-06 NOTE — Telephone Encounter (Signed)
Tried to reach patient by phone today with no answer and no response. Not able to leave a message.

## 2017-04-12 ENCOUNTER — Ambulatory Visit (INDEPENDENT_AMBULATORY_CARE_PROVIDER_SITE_OTHER): Payer: BLUE CROSS/BLUE SHIELD | Admitting: General Surgery

## 2017-04-12 ENCOUNTER — Encounter: Payer: Self-pay | Admitting: General Surgery

## 2017-04-12 VITALS — BP 114/62 | HR 72 | Resp 12 | Ht 71.0 in | Wt 131.0 lb

## 2017-04-12 DIAGNOSIS — K602 Anal fissure, unspecified: Secondary | ICD-10-CM

## 2017-04-12 NOTE — Progress Notes (Signed)
Patient ID: Kristy Shaw, female   DOB: 10/10/1976, 41 y.o.   MRN: [id number]  Chief Complaint  Patient presents with  . Rectal Problems    followup anal fissure     Kristy Shaw is a 41 y.o. female.  Here for follow up anal fissure. She doesn't feel she needs surgery. She states the area is healed, no pain or bleeding.  HPI  Past Medical History:  Diagnosis Date  . Allergy    seasonal  . Anemia    LAST HGB 12.8 ON 01-31-17  . Anxiety   . Asthma    WELL CONTROLLED  . Bipolar disorder (HCC)   . Breast mass, left 2017   PASH  . 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 Depression   . Dyspnea   . Dysrhythmia    IRREGULAR HEART BEAT  . Family history of adverse reaction to anesthesia    DAD-STOKE COMING OUT OF ANESTHESIA   . Fatigue   . Fibromyalgia   . GERD (gastroesophageal reflux disease)    OCC  . IBS (irritable bowel syndrome)   . Mitral valve prolapse   . Restless leg syndrome   . Sleep apnea    HAS CPAP MACHINE BUT DOES NOT USE ON A REGULAR BASIS    Past Surgical History:  Procedure Laterality Date  . BREAST LUMPECTOMY WITH RADIOACTIVE SEED LOCALIZATION Left 06/07/2016   Procedure: LEFT BREAST LUMPECTOMY WITH RADIOACTIVE SEED LOCALIZATION;  Surgeon: [name], MD;  Location: [facility];  Service: General;  Laterality: Left;  . DILATION AND CURETTAGE OF UTERUS    . HEMORRHOID SURGERY N/A 03/24/2017   Procedure: EXCISION ANORECTAL POLYP;  Surgeon: Kieth Brightly, MD;  Location: [facility];  Service: General;  Laterality: N/A;  . NASAL SEPTUM SURGERY      Family History  Problem Relation Age of Onset  . Hypertension Mother   . Stroke Mother   . Heart attack Father   . Stroke Father   . Depression Father   . Depression Sister   . Kidney disease Brother   . ADD / ADHD Brother   . Asthma Brother   . Breast cancer Maternal Grandmother 67    Social History Social History  Substance Use Topics  . Smoking status: Current Every Day  Smoker    Packs/day: 0.50    Years: 26.00    Types: Cigarettes    Start date: 09/23/1990  . Smokeless tobacco: Never Used  . Alcohol use No    Allergies  Allergen Reactions  . Adhesive [Tape]   . Doxycycline Rash    Current Outpatient Prescriptions  Medication Sig Dispense Refill  . albuterol (PROAIR HFA) 108 (90 BASE) MCG/ACT inhaler Inhale into the lungs every 6 (six) hours as needed.     . cetirizine (ZYRTEC) 10 MG tablet Take 10 mg by mouth daily as needed for allergies.     Marland Kitchen dibucaine (NUPERCAINAL) 1 % OINT Place 1 application rectally as needed for pain. 1 Tube 0  . fluticasone (FLONASE) 50 MCG/ACT nasal spray Place 1 spray into both nostrils daily as needed for allergies.     Marland Kitchen gabapentin (NEURONTIN) 300 MG capsule Take 1 capsule (300 mg total) by mouth 2 (two) times daily. 60 capsule 1  . omeprazole (PRILOSEC) 20 MG capsule Take 20 mg by mouth daily as needed.     . venlafaxine XR (EFFEXOR-XR) 150 MG 24 hr capsule Take 1 capsule (150 mg total) by mouth daily  with breakfast. 30 capsule 1   No current facility-administered medications for this visit.     Review of Systems Review of Systems  Constitutional: Negative.   Respiratory: Negative.   Gastrointestinal: Negative for blood in stool, constipation and diarrhea.    Blood pressure 114/62, pulse 72, resp. rate 12, height  (1.803 m), weight 131 lb (59.4 kg), last menstrual period 03/18/2017.  Physical Exam Physical Exam  Constitutional: She is oriented to person, place, and time. She appears well-developed and well-nourished.  Genitourinary:  Genitourinary Comments: Anal fissure appears to be healing. Swelling has gone down and patient reports no pain.   Neurological: She is alert and oriented to person, place, and time.  Skin: Skin is warm and dry.  Psychiatric: Her behavior is normal.    Data Reviewed Prior notes reviewed.   Assessment    Anal fissure appears to be healing. Swelling has gone down and  patient reports no pain.  Recommend leaving it alone, and advised patient to call if it gets worse again.  Plan    Follow up as needed. Advised patient to continue using miralax and avoid straining.   The patient is aware to call back for any questions or concerns.      HPI, Physical Exam, Assessment and Plan have been scribed under the direction and in the presence of Kathreen Cosier, MD  Dorathy Daft, RN  I have completed the exam and reviewed the above documentation for accuracy and completeness.  I agree with the above.  Museum/gallery conservator has been used and any errors in dictation or transcription are unintentional.  Caylah Plouff G. Evette Cristal, M.D., F.A.C.S.  Gerlene Burdock G 04/12/2017, 12:11 PM

## 2017-04-12 NOTE — Patient Instructions (Signed)
Follow up as needed. Advised patient to continue using miralax.

## 2017-05-02 ENCOUNTER — Other Ambulatory Visit: Payer: Self-pay | Admitting: Psychiatry

## 2017-05-09 ENCOUNTER — Encounter: Payer: Self-pay | Admitting: Psychiatry

## 2017-05-09 ENCOUNTER — Ambulatory Visit (INDEPENDENT_AMBULATORY_CARE_PROVIDER_SITE_OTHER): Payer: BLUE CROSS/BLUE SHIELD | Admitting: Psychiatry

## 2017-05-09 ENCOUNTER — Ambulatory Visit (INDEPENDENT_AMBULATORY_CARE_PROVIDER_SITE_OTHER): Payer: BLUE CROSS/BLUE SHIELD | Admitting: Licensed Clinical Social Worker

## 2017-05-09 VITALS — BP 110/65 | HR 63 | Temp 97.9°F | Wt 132.6 lb

## 2017-05-09 DIAGNOSIS — F331 Major depressive disorder, recurrent, moderate: Secondary | ICD-10-CM | POA: Diagnosis not present

## 2017-05-09 MED ORDER — GABAPENTIN 300 MG PO CAPS: 300.0000 mg | ORAL_CAPSULE | Freq: Two times a day (BID) | ORAL | 1 refills | Status: DC

## 2017-05-09 MED ORDER — VENLAFAXINE HCL ER 150 MG PO CP24: 150.0000 mg | ORAL_CAPSULE | Freq: Every day | ORAL | 1 refills | Status: DC

## 2017-05-09 NOTE — Progress Notes (Signed)
BH MD/PA/NP OP Progress Note  05/09/2017 11:20 AM Kristy Shaw  MRN:  [id number]  Subjective:  Patient is a 41 year old female who presented for the follow-up appointment. She reported that she was having some medical complications as she was diagnosed with rectal fissure and fistula. Patient reported that currently she is fine and has been compliant with her medications. She reported that she also lost some weight as she stopped eating during that period of time. She reported that she has been having issues with her mother-in-law. She was discussing that in detail. She reported that she wants to spend some time by herself so she can relax but it is difficult for her. She reported that she takes her medications as prescribed. She denied having any suicidal homicidal ideations or plans. She stated that the current combination of medications helping her. She does not have any side effects. She sleeps well at night. She denied use of drugs or alcohol at this time.    Pt is  pleasant and cooperative during the interview. She has been following with a therapist on a regular basis.   Chief Complaint: Chief Complaint    Follow-up; Medication Refill     Visit Diagnosis:     ICD-9-CM ICD-10-CM   1. Major depressive disorder, recurrent episode, moderate (HCC) 296.32 F33.1     Past Medical History:  Past Medical History:  Diagnosis Date  . Allergy    seasonal  . Anemia    LAST HGB 12.8 ON 01-31-17  . Anxiety   . Asthma    WELL CONTROLLED  . Bipolar disorder (HCC)   . Breast mass, left 2017   PASH  . 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 Depression   . Dyspnea   . Dysrhythmia    IRREGULAR HEART BEAT  . Family history of adverse reaction to anesthesia    DAD-STOKE COMING OUT OF ANESTHESIA   . Fatigue   . Fibromyalgia   . GERD (gastroesophageal reflux disease)    OCC  . IBS (irritable bowel syndrome)   . Mitral valve prolapse   . Restless leg syndrome   . Sleep apnea    HAS CPAP  MACHINE BUT DOES NOT USE ON A REGULAR BASIS    Past Surgical History:  Procedure Laterality Date  . BREAST LUMPECTOMY WITH RADIOACTIVE SEED LOCALIZATION Left 06/07/2016   Procedure: LEFT BREAST LUMPECTOMY WITH RADIOACTIVE SEED LOCALIZATION;  Surgeon: [name], MD;  Location: [facility];  Service: General;  Laterality: Left;  . DILATION AND CURETTAGE OF UTERUS    . HEMORRHOID SURGERY N/A 03/24/2017   Procedure: EXCISION ANORECTAL POLYP;  Surgeon: Kieth Brightly, MD;  Location: [facility];  Service: General;  Laterality: N/A;  . NASAL SEPTUM SURGERY     Family History:  Family History  Problem Relation Age of Onset  . Hypertension Mother   . Stroke Mother   . Heart attack Father   . Stroke Father   . Depression Father   . Depression Sister   . Kidney disease Brother   . ADD / ADHD Brother   . Asthma Brother   . Breast cancer Maternal Grandmother 47   Social History:  Social History   Social History  . Marital status: Married    Spouse name: N/A  . Number of children: N/A  . Years of education: N/A   Social History Main Topics  . Smoking status: Current Every Day Smoker    Packs/day: 0.50  Years: 26.00    Types: Cigarettes    Start date: 09/23/1990  . Smokeless tobacco: Never Used  . Alcohol use No  . Drug use: Yes    Types: Marijuana     Comment: OCC  . Sexual activity: Yes    Birth control/ protection: None   Other Topics Concern  . None   Social History Narrative  . None   Additional History:   Assessment:   Musculoskeletal: Strength & Muscle Tone: within normal limits Gait & Station: normal Patient leans: N/A  Psychiatric Specialty Exam: Medication Refill     Review of Systems  Musculoskeletal: Positive for back pain.  Psychiatric/Behavioral: Positive for depression. Negative for hallucinations, memory loss, substance abuse and suicidal ideas. The patient is nervous/anxious. The patient does not have insomnia.   All other  systems reviewed and are negative.   Blood pressure 110/65, pulse 63, temperature 97.9 F (36.6 C), temperature source Oral, weight 132 lb 9.6 oz (60.1 kg).Body mass index is 18.49 kg/m.  General Appearance: Well Groomed  Eye Contact:  Good  Speech:  Normal Rate  Volume:  Normal  Mood:  good  Affect:  Bright, smiling  Thought Process:  Linear  Orientation:  Full (Time, Place, and Person)  Thought Content:  Negative  Suicidal Thoughts:  No  Homicidal Thoughts:  No  Memory:  Immediate;   Good Recent;   Good Remote;   Good  Judgement:  Good  Insight:  Good  Psychomotor Activity:  Negative  Concentration:  Good  Recall:  Good  Fund of Knowledge: Good  Language: Good  Akathisia:  Negative  Handed:    AIMS (if indicated):    Assets:  Communication Skills Desire for Improvement Social Support  ADL's:  Intact  Cognition: WNL  Sleep:  good   Is the patient at risk to self?  No. Has the patient been a risk to self in the past 6 months?  No. Has the patient been a risk to self within the distant past?  No. Is the patient a risk to others?  No. Has the patient been a risk to others in the past 6 months?  No. Has the patient been a risk to others within the distant past?  No.  Current Medications: Current Outpatient Prescriptions  Medication Sig Dispense Refill  . albuterol (PROAIR HFA) 108 (90 BASE) MCG/ACT inhaler Inhale into the lungs every 6 (six) hours as needed.     . cetirizine (ZYRTEC) 10 MG tablet Take 10 mg by mouth daily as needed for allergies.     Marland Kitchen. dibucaine (NUPERCAINAL) 1 % OINT Place 1 application rectally as needed for pain. 1 Tube 0  . fluticasone (FLONASE) 50 MCG/ACT nasal spray Place 1 spray into both nostrils daily as needed for allergies.     Marland Kitchen. gabapentin (NEURONTIN) 300 MG capsule Take 1 capsule (300 mg total) by mouth 2 (two) times daily. 180 capsule 1  . omeprazole (PRILOSEC) 20 MG capsule Take 20 mg by mouth daily as needed.     . venlafaxine XR  (EFFEXOR-XR) 150 MG 24 hr capsule Take 1 capsule (150 mg total) by mouth daily with breakfast. 90 capsule 1   No current facility-administered medications for this visit.     Medical Decision Making:  Established Problem, Stable/Improving (1) and Review of Medication Regimen & Side Effects (2)  Treatment Plan Summary:Medication management and Plan   Continue medication as follows   Effexor XR 150 mg in the morning.   Neurontin  300 mg by mouth twice a day. She agreed with the plan.  Follow-up in 2 months or earlier depending on her symptoms.      More than 50% of the time spent in psychoeducation, counseling and coordination of care.    This note was generated in part or whole with voice recognition software. Voice regonition is usually quite accurate but there are transcription errors that can and very often do occur. I apologize for any typographical errors that were not detected and corrected.     Brandy Hale, MD  05/09/2017, 11:20 AM

## 2017-05-13 NOTE — Progress Notes (Signed)
   THERAPIST PROGRESS NOTE  Session Time: 75min  Participation Level: Active  Behavioral Response: Casual and NeatAlertDepressed  Type of Therapy: Individual Therapy  Treatment Goals addressed: Coping  Interventions: CBT, Motivational Interviewing, Solution Focused and Strength-based  Summary: Kristy Shaw is a 41 y.o. female who presents with continued symptoms of her diagnosis.  Patient reports that she has been upset with her in laws for the past few weeks.  Patient was able to vent her frustration and role played how to discuss with her husband her current thoughts and feelings.  Suicidal/Homicidal: No  Therapist Response: LCSW provided Patient with ongoing emotional support and encouragement.  Normalized her feelings.  Commended Patient on her progress and reinforced the importance of client staying focused on her own strengths and resources and resiliency. Processed various strategies for dealing with stressors.    Plan: Return again in2 weeks.  Diagnosis: Axis I: Depression    Axis II: No diagnosis    Kristy Elkicole M Peacock, LCSW 05/13/2017

## 2017-07-04 ENCOUNTER — Ambulatory Visit (INDEPENDENT_AMBULATORY_CARE_PROVIDER_SITE_OTHER): Payer: BLUE CROSS/BLUE SHIELD | Admitting: Psychiatry

## 2017-07-04 ENCOUNTER — Ambulatory Visit (INDEPENDENT_AMBULATORY_CARE_PROVIDER_SITE_OTHER): Payer: BLUE CROSS/BLUE SHIELD | Admitting: Licensed Clinical Social Worker

## 2017-07-04 DIAGNOSIS — F39 Unspecified mood [affective] disorder: Secondary | ICD-10-CM | POA: Diagnosis not present

## 2017-07-04 DIAGNOSIS — M797 Fibromyalgia: Secondary | ICD-10-CM | POA: Diagnosis not present

## 2017-07-04 DIAGNOSIS — F331 Major depressive disorder, recurrent, moderate: Secondary | ICD-10-CM | POA: Diagnosis not present

## 2017-07-04 MED ORDER — VENLAFAXINE HCL ER 150 MG PO CP24: 150.0000 mg | ORAL_CAPSULE | Freq: Every day | ORAL | 1 refills | Status: DC

## 2017-07-04 MED ORDER — TRAZODONE HCL 50 MG PO TABS: 50.0000 mg | ORAL_TABLET | Freq: Every evening | ORAL | 2 refills | Status: DC | PRN

## 2017-07-04 MED ORDER — GABAPENTIN 300 MG PO CAPS: 300.0000 mg | ORAL_CAPSULE | Freq: Two times a day (BID) | ORAL | 1 refills | Status: DC

## 2017-07-04 NOTE — Progress Notes (Signed)
BH MD/PA/NP OP Progress Note  07/04/2017 12:41 PM Kristy Shaw M Kristy Shaw  MRN:  [id number]  Subjective:  Patient is a 41 year old female who presented for the follow-up appointment. She reported that she has been doing well and is having some problems with insomnia.Pt reported that she is awake till later in the day and having issues with sleep. She is interested in taking medication for the same. Patient reported that she has been spending time with her 42-year-old daughter who will be starting kindergarten this fall. She reported that she is helping her and her husband will also be planning to take her to the school. She has been compliant with her medications. She reported that she is enjoying her summer break. She denied having any suicidal ideations or plans. She remains pleasant and cooperative during the interview.  She has been following with a therapist on a regular basis.   Chief Complaint:  Visit Diagnosis:     ICD-10-CM   1. Mood disorder (HCC) F39   2. Fibromyalgia M79.7     Past Medical History:  Past Medical History:  Diagnosis Date  . Allergy    seasonal  . Anemia    LAST HGB 12.8 ON 01-31-17  . Anxiety   . Asthma    WELL CONTROLLED  . Bipolar disorder (HCC)   . Breast mass, left 2017   PASH  . 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 Depression   . Dyspnea   . Dysrhythmia    IRREGULAR HEART BEAT  . Family history of adverse reaction to anesthesia    DAD-STOKE COMING OUT OF ANESTHESIA   . Fatigue   . Fibromyalgia   . GERD (gastroesophageal reflux disease)    OCC  . IBS (irritable bowel syndrome)   . Mitral valve prolapse   . Restless leg syndrome   . Sleep apnea    HAS CPAP MACHINE BUT DOES NOT USE ON A REGULAR BASIS    Past Surgical History:  Procedure Laterality Date  . BREAST LUMPECTOMY WITH RADIOACTIVE SEED LOCALIZATION Left 06/07/2016   Procedure: LEFT BREAST LUMPECTOMY WITH RADIOACTIVE SEED LOCALIZATION;  Surgeon: Chevis PrettyPaul Toth III, MD;  Location: [facility];  Service: General;  Laterality: Left;  . DILATION AND CURETTAGE OF UTERUS    . HEMORRHOID SURGERY N/A 03/24/2017   Procedure: EXCISION ANORECTAL POLYP;  Surgeon: Kieth BrightlySeeplaputhur G Sankar, MD;  Location: [facility];  Service: General;  Laterality: N/A;  . NASAL SEPTUM SURGERY     Family History:  Family History  Problem Relation Age of Onset  . Hypertension Mother   . Stroke Mother   . Heart attack Father   . Stroke Father   . Depression Father   . Depression Sister   . Kidney disease Brother   . ADD / ADHD Brother   . Asthma Brother   . Breast cancer Maternal Grandmother [id number]   Social History:  Social History   Social History  . Marital status: Married    Spouse name: N/A  . Number of children: N/A  . Years of education: N/A   Social History Main Topics  . Smoking status: Current Every Day Smoker    Packs/day: 0.50    Years: 26.00    Types: Cigarettes    Start date: 09/23/1990  . Smokeless tobacco: Never Used  . Alcohol use No  . Drug use: Yes    Types: Marijuana     Comment: OCC  . Sexual activity: Yes    Birth  control/ protection: None   Other Topics Concern  . Not on file   Social History Narrative  . No narrative on file   Additional History:   Assessment:   Musculoskeletal: Strength & Muscle Tone: within normal limits Gait & Station: normal Patient leans: N/A  Psychiatric Specialty Exam: Medication Refill     Review of Systems  Musculoskeletal: Positive for back pain.  Psychiatric/Behavioral: Positive for depression. Negative for hallucinations, memory loss, substance abuse and suicidal ideas. The patient is nervous/anxious. The patient does not have insomnia.   All other systems reviewed and are negative.   There were no vitals taken for this visit.There is no height or weight on file to calculate BMI.  General Appearance: Well Groomed  Eye Contact:  Good  Speech:  Normal Rate  Volume:  Normal  Mood:  good  Affect:  Bright, smiling   Thought Process:  Linear  Orientation:  Full (Time, Place, and Person)  Thought Content:  Negative  Suicidal Thoughts:  No  Homicidal Thoughts:  No  Memory:  Immediate;   Good Recent;   Good Remote;   Good  Judgement:  Good  Insight:  Good  Psychomotor Activity:  Negative  Concentration:  Good  Recall:  Good  Fund of Knowledge: Good  Language: Good  Akathisia:  Negative  Handed:    AIMS (if indicated):    Assets:  Communication Skills Desire for Improvement Social Support  ADL's:  Intact  Cognition: WNL  Sleep:  good   Is the patient at risk to self?  No. Has the patient been a risk to self in the past 6 months?  No. Has the patient been a risk to self within the distant past?  No. Is the patient a risk to others?  No. Has the patient been a risk to others in the past 6 months?  No. Has the patient been a risk to others within the distant past?  No.  Current Medications: Current Outpatient Prescriptions  Medication Sig Dispense Refill  . albuterol (PROAIR HFA) 108 (90 BASE) MCG/ACT inhaler Inhale into the lungs every 6 (six) hours as needed.     . cetirizine (ZYRTEC) 10 MG tablet Take 10 mg by mouth daily as needed for allergies.     Marland Kitchen dibucaine (NUPERCAINAL) 1 % OINT Place 1 application rectally as needed for pain. 1 Tube 0  . fluticasone (FLONASE) 50 MCG/ACT nasal spray Place 1 spray into both nostrils daily as needed for allergies.     Marland Kitchen gabapentin (NEURONTIN) 300 MG capsule Take 1 capsule (300 mg total) by mouth 2 (two) times daily. 180 capsule 1  . omeprazole (PRILOSEC) 20 MG capsule Take 20 mg by mouth daily as needed.     . traZODone (DESYREL) 50 MG tablet Take 1 tablet (50 mg total) by mouth at bedtime as needed for sleep. 30 tablet 2  . venlafaxine XR (EFFEXOR-XR) 150 MG 24 hr capsule Take 1 capsule (150 mg total) by mouth daily with breakfast. 90 capsule 1   No current facility-administered medications for this visit.     Medical Decision Making:   Established Problem, Stable/Improving (1) and Review of Medication Regimen & Side Effects (2)  Treatment Plan Summary:Medication management and Plan   Continue medication as follows   Effexor XR 150 mg in the morning.   Neurontin 300 mg by mouth twice a day. She agreed with the plan. I will start her on trazodone 50 mg by mouth daily at bedtime when necessary  for insomnia.  Follow-up in 2 months or earlier depending on her symptoms.      More than 50% of the time spent in psychoeducation, counseling and coordination of care.    This note was generated in part or whole with voice recognition software. Voice regonition is usually quite accurate but there are transcription errors that can and very often do occur. I apologize for any typographical errors that were not detected and corrected.     Brandy Hale, MD  07/04/2017, 12:41 PM

## 2017-07-26 NOTE — Progress Notes (Signed)
   THERAPIST PROGRESS NOTE  Session Time:4260min  Participation Level: Active  Behavioral Response: Casual and NeatAlertDepressed  Type of Therapy: Individual Therapy  Treatment Goals addressed: Coping  Interventions: CBT, Motivational Interviewing, Solution Focused and Strength-based  Summary: Kristy Shaw is a 41 y.o. female who presents with continued symptoms of her diagnosis.  Engaged Patient in discussion about his current mood and symptoms that she is current experiencing.  Facilitated a discussion on her schedule and how he was able to manage.  Discussed her progress and regression. Assisted Patient with verbally listing things that are going well.  Shifted focus of discussion on medication management Suicidal/Homicidal: No  Therapist Response: Provided support for Patient as she discussed her current mood. Stressed the importance of mood stabilization through learned coping skills and medication management.  Encouraged Patient to focus on her strengths and the positive aspects.  Plan: Return again in2 weeks.  Diagnosis: Axis I: Depression    Axis II: No diagnosis    Marinda Elkicole M Peacock, LCSW 07/04/2017

## 2017-10-12 ENCOUNTER — Ambulatory Visit (INDEPENDENT_AMBULATORY_CARE_PROVIDER_SITE_OTHER): Payer: BLUE CROSS/BLUE SHIELD | Admitting: Psychiatry

## 2017-10-12 ENCOUNTER — Encounter: Payer: Self-pay | Admitting: Psychiatry

## 2017-10-12 VITALS — BP 120/76 | HR 86 | Wt 123.4 lb

## 2017-10-12 DIAGNOSIS — F341 Dysthymic disorder: Secondary | ICD-10-CM

## 2017-10-12 DIAGNOSIS — G4733 Obstructive sleep apnea (adult) (pediatric): Secondary | ICD-10-CM | POA: Diagnosis not present

## 2017-10-12 DIAGNOSIS — Z9989 Dependence on other enabling machines and devices: Secondary | ICD-10-CM | POA: Diagnosis not present

## 2017-10-12 DIAGNOSIS — M797 Fibromyalgia: Secondary | ICD-10-CM | POA: Insufficient documentation

## 2017-10-12 DIAGNOSIS — F17200 Nicotine dependence, unspecified, uncomplicated: Secondary | ICD-10-CM

## 2017-10-12 MED ORDER — GABAPENTIN 300 MG PO CAPS: 300.0000 mg | ORAL_CAPSULE | Freq: Two times a day (BID) | ORAL | 2 refills | Status: DC

## 2017-10-12 MED ORDER — TRAZODONE HCL 50 MG PO TABS: 50.0000 mg | ORAL_TABLET | Freq: Every evening | ORAL | 2 refills | Status: DC | PRN

## 2017-10-12 MED ORDER — VENLAFAXINE HCL ER 150 MG PO CP24: 150.0000 mg | ORAL_CAPSULE | Freq: Every day | ORAL | 1 refills | Status: DC

## 2017-10-12 NOTE — Progress Notes (Signed)
BH MD Progress Note  10/12/2017 11:59 AM Kristy Shaw  MRN:  [id number]  Chief Complaint: " I still feel tired."  Chief Complaint    Follow-up; Medication Refill     HPI: Kristy Shaw is a 41 year old Caucasian female who is married,  lives in [place] with her husband and 42-year-old child, who has a history of depression, fibromyalgia obstructive sleep apnea who presented to the clinic for a follow-up appointment.  Kristy Shaw used to follow up with Dr. Garnetta BuddyFaheem.  Kristy Shaw today reports that she continues to have low energy, lack of motivation to do things ,since she struggles with fibromyalgia on a regular basis.  She feels sad that she is not able to spend quality time with her family, like playing with her child.  She feels tired and sad on a regular basis.  However there are times when she has these bursts of energy when she wants to do a lot of things but that does not last very long.  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  Her husband continues to be supportive   She also reports sleep issues. Kristy Shaw is not taking trazodone.  Discussed sleep hygiene with her.  She reports that she does not have a set bedtime routine.  She stays in bed late, her husband takes care of the child in the morning.  She however would like to start following a good sleep routine to see if that will help with her sleep issues. She has a history of being diagnosed with obstructive sleep apnea.  She is supposed to use CPAP.  She reports that she did not like to use the mask and it made her more anxious and hence quit using it a long time ago.  She reports that she is aware of the consequences of not using CPAP.  She would like to go back to her provider and discuss further management.  Kristy Shaw reports stressors of her sister who is not doing well.  She reports that her sister constantly blames everyone for her failures, depends on her and her parents for her financial problems, wants her parents to take  care of her children and so on.  She reports that she wants to get her sister the help she needs but does not know how to.  Kristy Shaw became very tearful when she talked about her sister.  Kristy Standard. Kristy Shaw reports that she took herself off the gabapentin thinking that she did not need it and that did not work for her.  However once she stopped it she felt worse and she started back on a smaller dose of 100 mg 3 times a day . She would like to go back to her normal dose at this time.  Kristy Shaw denies any side effects to her Effexor.  She continues to want to take the 150 mg.  Kristy Shaw smokes cigarettes , also smokes cannabis weekly basis. She wants to quit and is trying to .   Visit Diagnosis:    ICD-10-CM   1. Persistent depressive disorder with mixed features, currently moderate F34.1 venlafaxine XR (EFFEXOR-XR) 150 MG 24 hr capsule    traZODone (DESYREL) 50 MG tablet    gabapentin (NEURONTIN) 300 MG capsule  2. OSA on CPAP G47.33    Z99.89     Past Psychiatric History: History of depression, anxiety, mood do.  She denies history of suicide attempts.  She denies past inpatient mental health admissions.  Past Medical History:  Past Medical History:  Diagnosis Date  . Allergy    seasonal  . Anemia    LAST HGB 12.8 ON 01-31-17  . Anxiety   . Asthma    WELL CONTROLLED  . Breast mass, left 2017   PASH  . 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 Depression   . Dyspnea   . Dysrhythmia    IRREGULAR HEART BEAT  . Family history of adverse reaction to anesthesia    DAD-STOKE COMING OUT OF ANESTHESIA   . Fatigue   . Fibromyalgia   . GERD (gastroesophageal reflux disease)    OCC  . IBS (irritable bowel syndrome)   . Mitral valve prolapse   . Restless leg syndrome   . Sleep apnea    HAS CPAP MACHINE BUT DOES NOT USE ON A REGULAR BASIS    Past Surgical History:  Procedure Laterality Date  . BREAST LUMPECTOMY WITH RADIOACTIVE SEED LOCALIZATION Left 06/07/2016   Procedure: LEFT BREAST LUMPECTOMY  WITH RADIOACTIVE SEED LOCALIZATION;  Surgeon: [name], MD;  Location: [facility];  Service: General;  Laterality: Left;  . DILATION AND CURETTAGE OF UTERUS    . HEMORRHOID SURGERY N/A 03/24/2017   Procedure: EXCISION ANORECTAL POLYP;  Surgeon: Kieth Brightly, MD;  Location: [facility];  Service: General;  Laterality: N/A;  . NASAL SEPTUM SURGERY      Family Psychiatric History: Sister has mental health issues, history of ADD in brother, depression in father.  Family History:  Family History  Problem Relation Age of Onset  . Hypertension Mother   . Stroke Mother   . Heart attack Father   . Stroke Father   . Depression Father   . Depression Sister   . Kidney disease Brother   . ADD / ADHD Brother   . Asthma Brother   . Breast cancer Maternal Grandmother 82    Social History: Kristy Standard has a bachelor's degree in criminal justice.  She is divorced once.  She is married currently and lives with her husband and 71-year-old child.  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 Social History   Social History  . Marital status: Married    Spouse name: N/A  . Number of children: N/A  . Years of education: N/A   Social History Main Topics  . Smoking status: Current Every Day Smoker    Packs/day: 0.50    Years: 26.00    Types: Cigarettes    Start date: 09/23/1990  . Smokeless tobacco: Never Used  . Alcohol use No  . Drug use: Yes    Types: Marijuana     Comment: OCC  . Sexual activity: Yes    Birth control/ protection: None   Other Topics Concern  . None   Social History Narrative  . None    Allergies:  Allergies  Allergen Reactions  . Adhesive [Tape]   . Doxycycline Rash    Metabolic Disorder Labs: No results found for: HGBA1C, MPG No results found for: PROLACTIN No results found for: CHOL, TRIG, HDL, CHOLHDL, VLDL, LDLCALC No results found for: TSH  Therapeutic Level Labs: No results found for: LITHIUM No results  found for: VALPROATE No components found for:  CBMZ  Current Medications: Current Outpatient Prescriptions  Medication Sig Dispense Refill  . albuterol (PROAIR HFA) 108 (90 BASE) MCG/ACT inhaler Inhale into the lungs every 6 (six) hours as needed.     . cetirizine (ZYRTEC) 10 MG tablet Take 10 mg by mouth daily  as needed for allergies.     . fluticasone (FLONASE) 50 MCG/ACT nasal spray Place 1 spray into both nostrils daily as needed for allergies.     Marland Kitchen gabapentin (NEURONTIN) 300 MG capsule Take 1 capsule (300 mg total) by mouth 2 (two) times daily. 60 capsule 2  . omeprazole (PRILOSEC) 20 MG capsule Take 20 mg by mouth daily as needed.     . traZODone (DESYREL) 50 MG tablet Take 1 tablet (50 mg total) by mouth at bedtime as needed for sleep. 30 tablet 2  . venlafaxine XR (EFFEXOR-XR) 150 MG 24 hr capsule Take 1 capsule (150 mg total) by mouth daily with breakfast. 90 capsule 1   No current facility-administered medications for this visit.      Musculoskeletal: Strength & Muscle Tone: within normal limits Gait & Station: normal Patient leans: N/A  Psychiatric Specialty Exam: Review of Systems  Psychiatric/Behavioral: Positive for depression. The patient is nervous/anxious and has insomnia.   All other systems reviewed and are negative.   Blood pressure 120/76, pulse 86, weight 123 lb 6.4 oz (56 kg).Body mass index is 17.21 kg/m.  General Appearance: Casual  Eye Contact:  Fair  Speech:  Normal Rate  Volume:  Normal  Mood:  Dysphoric  Affect:  tearful at times  Thought Process:  Goal Directed and Descriptions of Associations: Intact  Orientation:  Full (Time, Place, and Person)  Thought Content: Logical   Suicidal Thoughts:  No  Homicidal Thoughts:  No  Memory:  Immediate;   Fair Recent;   Fair Remote;   Fair  Judgement:  Fair  Insight:  Fair  Psychomotor Activity:  Normal  Concentration:  Concentration: Fair and Attention Span: Fair  Recall:  [employer] of Knowledge:  Fair  Language: Fair  Akathisia:  No  Handed:  Right  AIMS (if indicated): NA  Assets:  Communication Skills Desire for Improvement Social Support Vocational/Educational  ADL's:  Intact  Cognition: WNL  Sleep:  Poor   Screenings:PHQ9   Assessment and Plan: Kristy Standard is a 41 year old Caucasian female who has a history of depression, sleep issues, obstructive sleep apnea, relational stressors, who presented to the clinic for a follow-up appointment.  She continues to struggle with mood sx, low energy. Her mood sx likely due to her fibromyalgia. She does not meet criteria for bipolar do. Her sleep issues as well as low energy also likely due to OSA , she is not compliant with CPAP. Discussed medication management, provided supportive therapy, discussed sleep hygiene.  Plan as noted below.  Plan For depression Continue Effexor XR 150 mg p.o. daily. Continue gabapentin 300 mg p.o. twice daily. phq-9 =6  For sleep problems Discussed sleep hygiene Discussed the importance of using her CPAP, she will follow-up with her provider for further management. Trazodone 50 mg p.o. Nightly.  For tobacco use do: Discussed smoking cessation.  Cannabis use: She wants to quit , she uses it weekly, gets it from a friend.  Order TSH, iron panel.  She will get this with her primary medical doctor.  Provided medication education.  Follow-up in 6-8 weeks.  More than 50 % of the time was spent for psychoeducation and supportive psychotherapy and care coordination.  This note was generated in part or whole with voice recognition software. Voice recognition is usually quite accurate but there are transcription errors that can and very often do occur. I apologize for any typographical errors that were not detected and corrected.  [name], MD 10/12/2017, 11:59 AM

## 2017-10-12 NOTE — Patient Instructions (Signed)
Please get your thyroid as well as your Iron panel  checked with your PMD .

## 2017-10-26 IMAGING — MG MM DIAG BREAST TOMO UNI LEFT
11 of 16 series · 11 of 32 positions shown · non-contrast
Comparison: 02/17/2016

CLINICAL DATA: 39-year-old patient with possible mass in distortion
identified on recent baseline 2D screening mammogram. Both of the
patient's grandmothers have a history of breast cancer.

EXAM:
DIGITAL DIAGNOSTIC LEFT MAMMOGRAM WITH 3D TOMOSYNTHESIS WITH CAD
ULTRASOUND LEFT BREAST

[L CC synth-2D (1 of 2)]
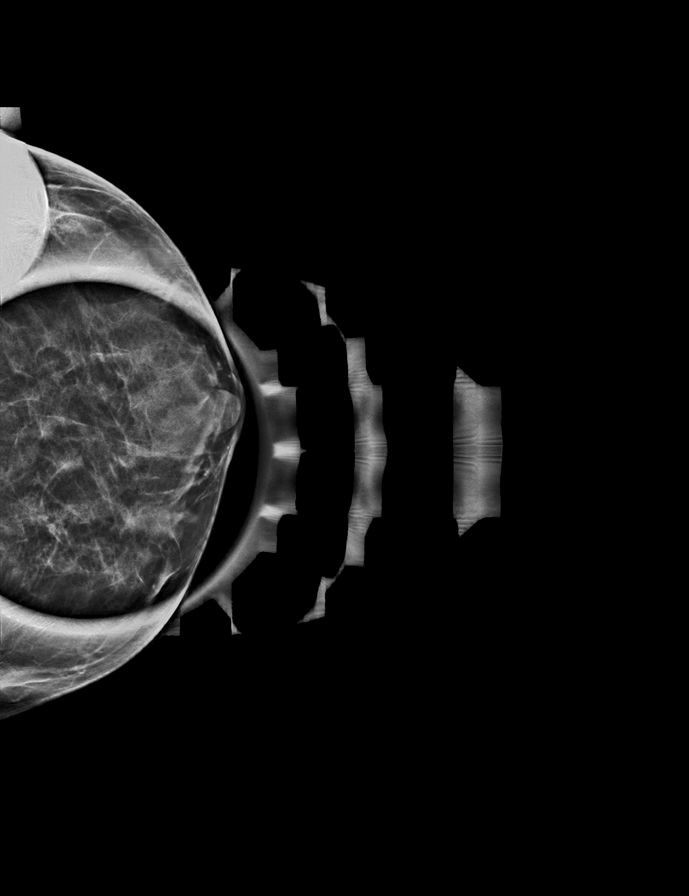

[L CC synth-2D (2 of 2)]
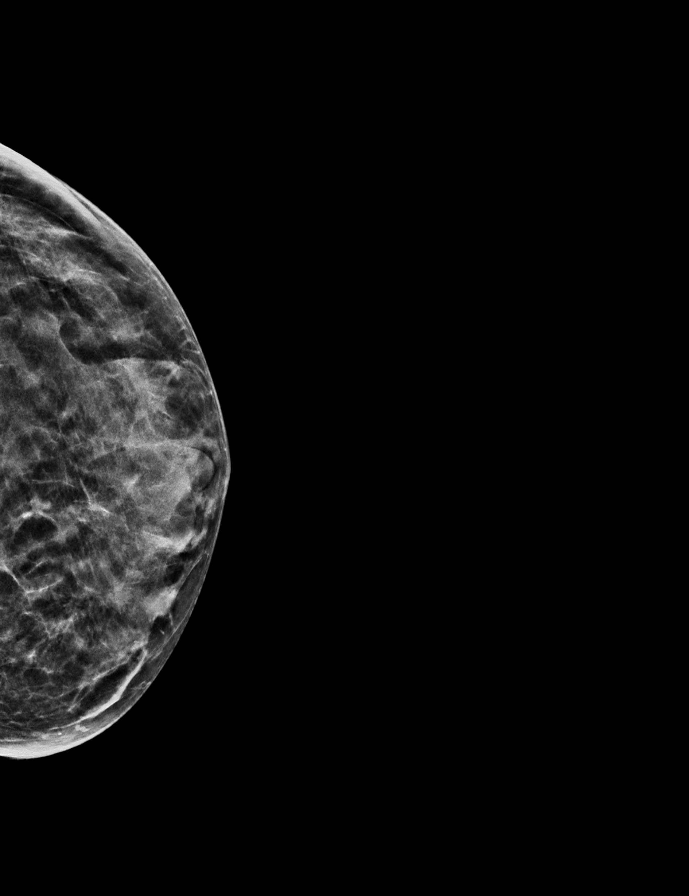

[L MLO]
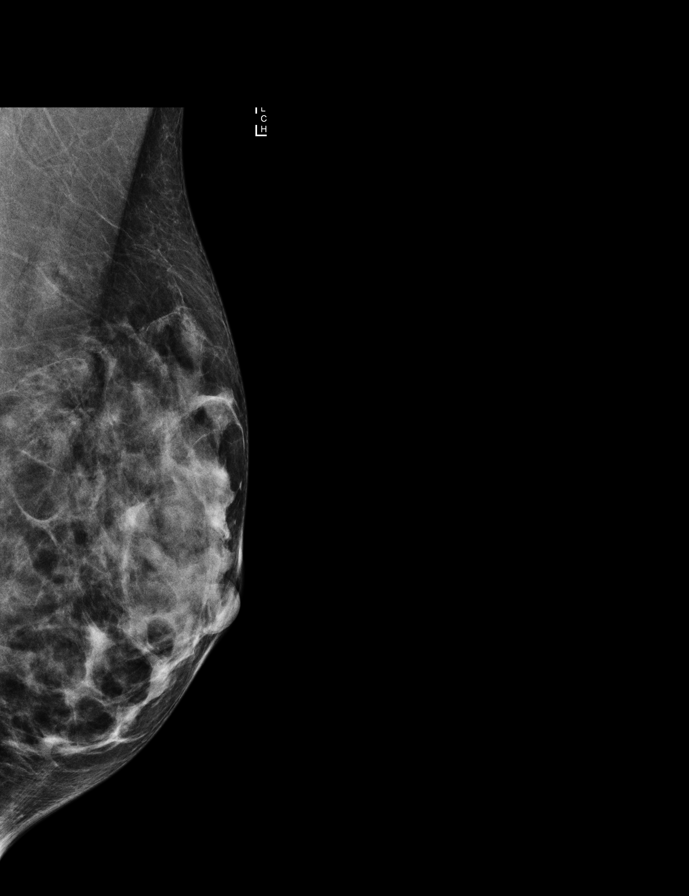

[L CC (1 of 2)]
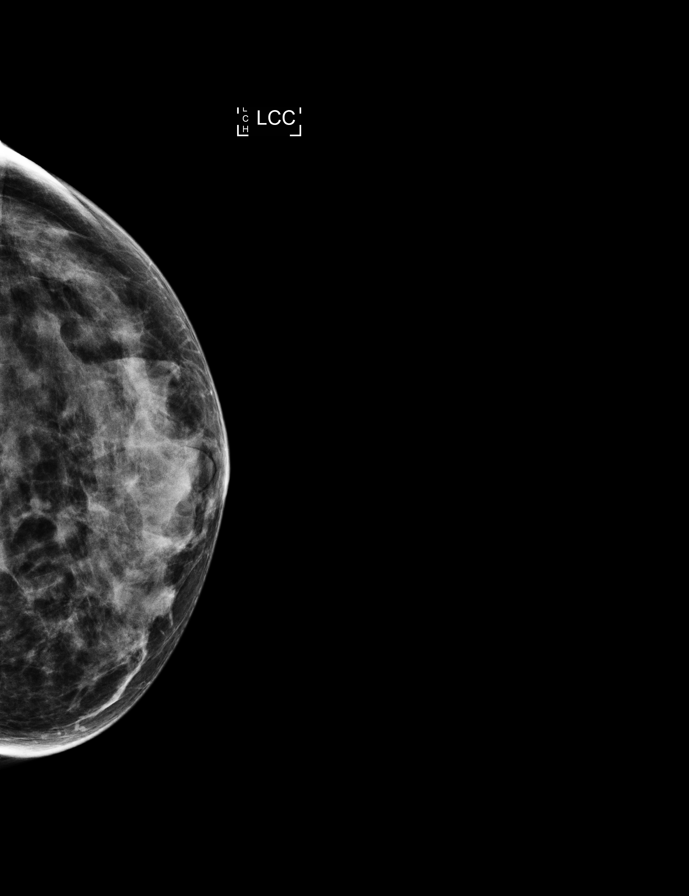

[L ML]
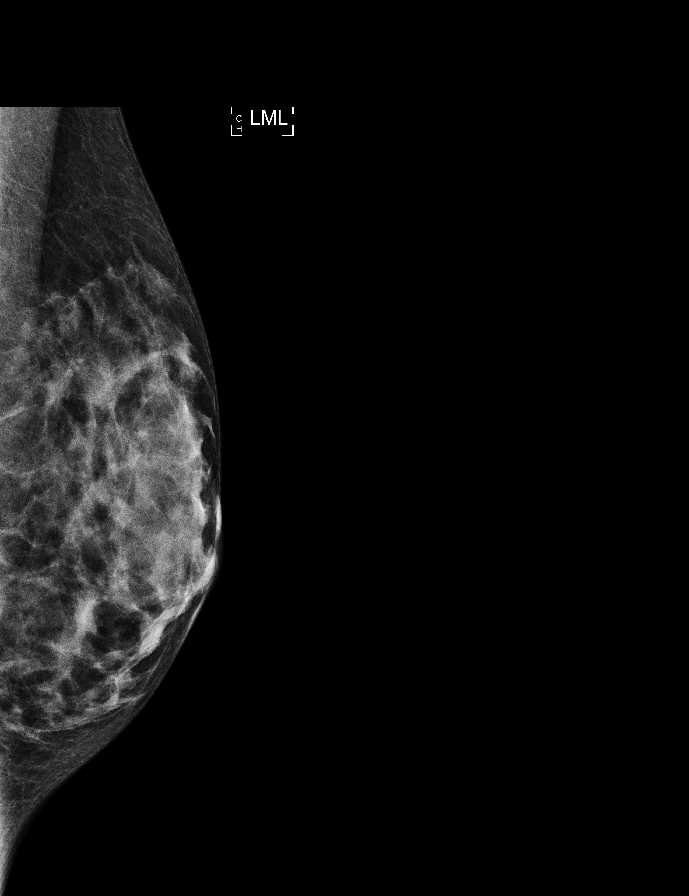

[L CC (2 of 2)]
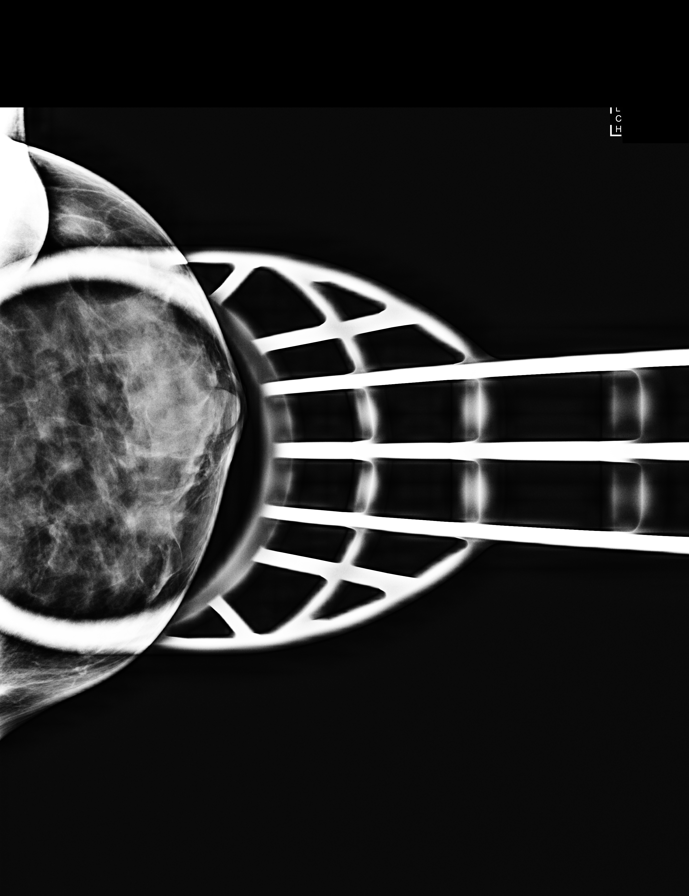

[L MLO synth-2D]
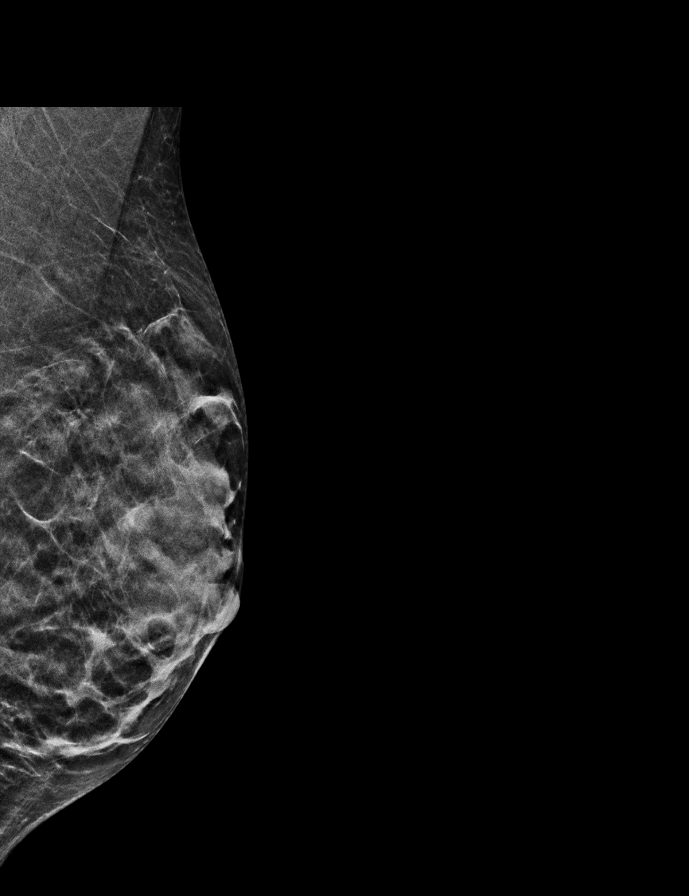

[L ML synth-2D]
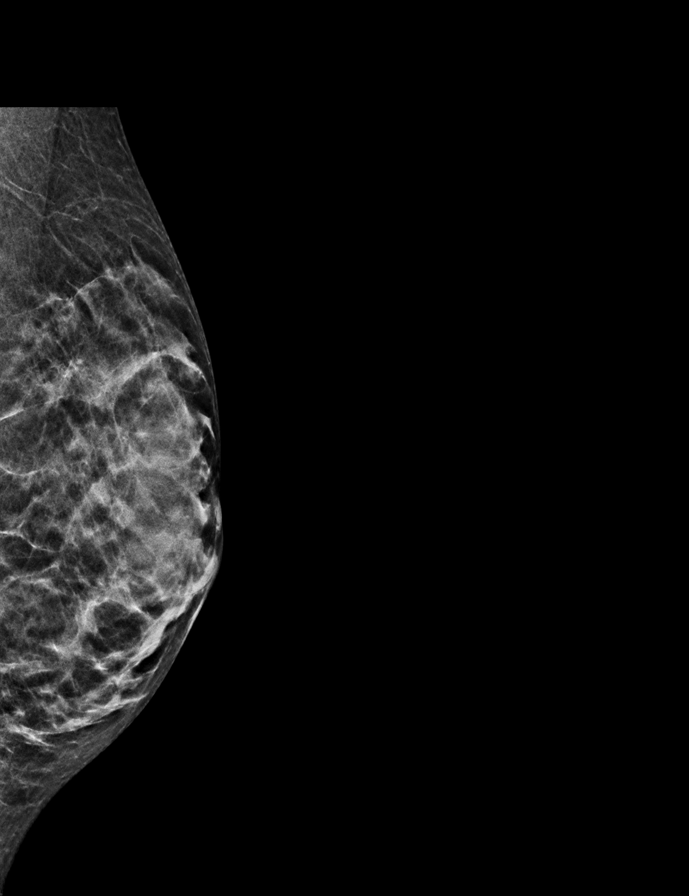

[L CC tomo]
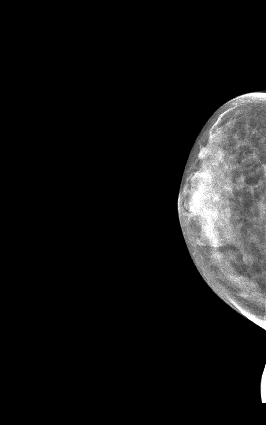

[L ML tomo]
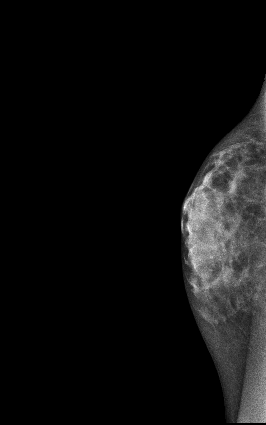

[L MLO tomo]
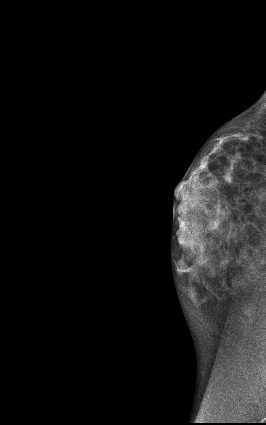

[11 of 32 positions shown; findings below may reference images not displayed]

ACR Breast Density Category c: The breast tissue is heterogeneously
dense, which may obscure small masses.
FINDINGS: Whole breast tomographic views confirm a small mass with associated
distortion in the upper inner left breast.

Mammographic images were processed with CAD.

On physical exam, I do not palpate a discrete mass in the upper
inner quadrant of the left breast. No mass is palpated in the left
axilla.

Targeted ultrasound is performed, showing an irregular hypoechoic
mass with indistinct margins and slight posterior acoustic shadowing
at 10 o'clock position 3 cm from the nipple. This mass measures 3 x
6 x 3 mm. Adjacent to the suspicious mass, but slightly more
lateral, toward 11 o'clock position, is an oval cystic mass with
internal septations/debris that measures 7 x 4 x 7 mm. This mass
appears benign. These 2 masses can be seen in the same imaging
field. Ultrasound of the left axilla shows no evidence of
lymphadenopathy.
IMPRESSION: Suspicious 0.6 x 0.3 x 0.3 cm mass 10 o'clock position left breast.
Malignancy cannot be excluded. Our office will contact ordering
physician and schedule a biopsy.

RECOMMENDATION:

Ultrasound-guided biopsy is recommended.

I have discussed the findings and recommendations with the patient.
Results were also provided in writing at the conclusion of the
visit. If applicable, a reminder letter will be sent to the patient
regarding the next appointment.

BI-RADS CATEGORY  4: Suspicious.

## 2017-12-07 ENCOUNTER — Ambulatory Visit (INDEPENDENT_AMBULATORY_CARE_PROVIDER_SITE_OTHER): Payer: BLUE CROSS/BLUE SHIELD | Admitting: Psychiatry

## 2017-12-07 ENCOUNTER — Other Ambulatory Visit: Payer: Self-pay

## 2017-12-07 ENCOUNTER — Encounter: Payer: Self-pay | Admitting: Psychiatry

## 2017-12-07 VITALS — BP 113/73 | HR 73 | Temp 97.7°F | Wt 128.2 lb

## 2017-12-07 DIAGNOSIS — F17200 Nicotine dependence, unspecified, uncomplicated: Secondary | ICD-10-CM | POA: Diagnosis not present

## 2017-12-07 DIAGNOSIS — G894 Chronic pain syndrome: Secondary | ICD-10-CM | POA: Diagnosis not present

## 2017-12-07 DIAGNOSIS — Z9989 Dependence on other enabling machines and devices: Secondary | ICD-10-CM

## 2017-12-07 DIAGNOSIS — F341 Dysthymic disorder: Secondary | ICD-10-CM

## 2017-12-07 DIAGNOSIS — G4733 Obstructive sleep apnea (adult) (pediatric): Secondary | ICD-10-CM

## 2017-12-07 MED ORDER — GABAPENTIN 300 MG PO CAPS: 300.0000 mg | ORAL_CAPSULE | Freq: Two times a day (BID) | ORAL | 1 refills | Status: DC

## 2017-12-07 MED ORDER — TRAZODONE HCL 50 MG PO TABS: 50.0000 mg | ORAL_TABLET | Freq: Every evening | ORAL | 1 refills | Status: DC | PRN

## 2017-12-07 NOTE — Progress Notes (Signed)
[name] Progress Note  12/07/2017 12:04 PM Kristy Shaw  MRN:  [id number]  Chief Complaint: ' I feel better."  Chief Complaint    Follow-up; Medication Refill     HPI: Kristy Shaw is a 41 year old Caucasian female who is married, lives in [place] with her husband and 42-year-old child, has a history of depression, fibromyalgia, OSA who presented to the clinic today for a follow-up visit.  Kristy Shaw today reports that she has noticed some improvement in her mood symptoms as well as anxiety issues.  She reports that she has been compliant on her Effexor and denies any side effects.  Kristy Shaw also reports that the gabapentin increased dosage is actually helping her with her fibromyalgia pain.  She continues to have some stiffness and pain in her joints on and off.  She reports that she has been coping by exercising more .  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  Kristy Shaw continues to have sleep issues.  Kristy Shaw reports she continues to have dreams that are vivid.  She reports she is not taking her trazodone like she should and would like to try it.  She has been trying to go to bed earlier than before.  And she would like to try the trazodone early p.m. and see if that will take away some of the grogginess when she has tried it in the past.  She does have OSA and she is not compliant on her CPAP.  She reports she is going to call the CPAP machine cleaning company to get it cleaned and then she wants to use it on a regular basis.  Kristy Shaw continues to struggle with her sisters mental health issues.  She reports she feels her parents may be enabling her behavior.  She continues to want to be supportive but she does not know how to do it anymore.  Some time was and providing supportive psychotherapy.  Kristy Shaw has been cutting down on the smoking of her cigarettes, as well as the cannabis.  Kristy Shaw looks forward to the holiday break.   Visit  Diagnosis:    ICD-10-CM   1. Persistent depressive disorder with mixed features, currently moderate F34.1 gabapentin (NEURONTIN) 300 MG capsule    traZODone (DESYREL) 50 MG tablet  2. OSA on CPAP G47.33    Z99.89   3. Tobacco use disorder F17.200   4. Chronic pain syndrome G89.4     Past Psychiatric History: Hx of depression, anxiety, mood disorder.  She denies history of suicide attempts.  She denies past inpatient mental health admissions.  Past Medical History:  Past Medical History:  Diagnosis Date  . Allergy    seasonal  . Anemia    LAST HGB 12.8 ON 01-31-17  . Anxiety   . Asthma    WELL CONTROLLED  . Breast mass, left 2017   PASH  . 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 Depression   . Dyspnea   . Dysrhythmia    IRREGULAR HEART BEAT  . Family history of adverse reaction to anesthesia    DAD-STOKE COMING OUT OF ANESTHESIA   . Fatigue   . Fibromyalgia   . GERD (gastroesophageal reflux disease)    OCC  . IBS (irritable bowel syndrome)   . Mitral valve prolapse   . Restless leg syndrome   . Sleep apnea    HAS CPAP MACHINE BUT DOES NOT USE ON A REGULAR BASIS  Past Surgical History:  Procedure Laterality Date  . BREAST LUMPECTOMY WITH RADIOACTIVE SEED LOCALIZATION Left 06/07/2016   Procedure: LEFT BREAST LUMPECTOMY WITH RADIOACTIVE SEED LOCALIZATION;  Surgeon: [name], MD;  Location: [facility];  Service: General;  Laterality: Left;  . DILATION AND CURETTAGE OF UTERUS    . HEMORRHOID SURGERY N/A 03/24/2017   Procedure: EXCISION ANORECTAL POLYP;  Surgeon: Kieth Brightly, MD;  Location: [facility];  Service: General;  Laterality: N/A;  . NASAL SEPTUM SURGERY      Family Psychiatric History: Sister has mental health issues, history of ADD in brother, depression in father.  Family History:  Family History  Problem Relation Age of Onset  . Hypertension Mother   . Stroke Mother   . Heart attack Father   . Stroke Father   . Depression Father    . Depression Sister   . Kidney disease Brother   . ADD / ADHD Brother   . Asthma Brother   . Breast cancer Maternal Grandmother 60   Substance abuse history Cannabis use occasional.   Social History: Kristy Standard has a bachelor's degree in criminal justice.  She is divorced once.  She is married currently and lives with her husband and 2-year-old child.  She does housekeeping jobs .  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 Social History   Socioeconomic History  . Marital status: Married    Spouse name: None  . Number of children: None  . Years of education: None  . Highest education level: None  Social Needs  . Financial resource strain: None  . Food insecurity - worry: None  . Food insecurity - inability: None  . Transportation needs - medical: None  . Transportation needs - non-medical: None  Occupational History  . None  Tobacco Use  . Smoking status: Current Every Day Smoker    Packs/day: 0.50    Years: 26.00    Pack years: 13.00    Types: Cigarettes    Start date: 09/23/1990  . Smokeless tobacco: Never Used  Substance and Sexual Activity  . Alcohol use: No    Alcohol/week: 0.0 oz  . Drug use: Yes    Types: Marijuana    Comment: OCC  . Sexual activity: Yes    Birth control/protection: None  Other Topics Concern  . None  Social History Narrative  . None    Allergies:  Allergies  Allergen Reactions  . Adhesive [Tape]   . Doxycycline Rash    Metabolic Disorder Labs: No results found for: HGBA1C, MPG No results found for: PROLACTIN No results found for: CHOL, TRIG, HDL, CHOLHDL, VLDL, LDLCALC No results found for: TSH  Therapeutic Level Labs: No results found for: LITHIUM No results found for: VALPROATE No components found for:  CBMZ  Current Medications: Current Outpatient Medications  Medication Sig Dispense Refill  . albuterol (PROAIR HFA) 108 (90 BASE) MCG/ACT inhaler Inhale into the lungs every 6 (six) hours as needed.     . cetirizine (ZYRTEC) 10 MG tablet  Take 10 mg by mouth daily as needed for allergies.     . fluticasone (FLONASE) 50 MCG/ACT nasal spray Place 1 spray into both nostrils daily as needed for allergies.     Marland Kitchen gabapentin (NEURONTIN) 300 MG capsule Take 1 capsule (300 mg total) by mouth 2 (two) times daily. 180 capsule 1  . omeprazole (PRILOSEC) 20 MG capsule Take 20 mg by mouth daily as needed.     . traZODone (DESYREL) 50 MG tablet Take 1-1.5  tablets (50-75 mg total) by mouth at bedtime as needed for sleep. 135 tablet 1  . venlafaxine XR (EFFEXOR-XR) 150 MG 24 hr capsule Take 1 capsule (150 mg total) by mouth daily with breakfast. 90 capsule 1   No current facility-administered medications for this visit.      Musculoskeletal: Strength & Muscle Tone: within normal limits Gait & Station: normal Patient leans: N/A  Psychiatric Specialty Exam: Review of Systems  Psychiatric/Behavioral: The patient is nervous/anxious and has insomnia.   All other systems reviewed and are negative.   Blood pressure 113/73, pulse 73, temperature 97.7 F (36.5 C), temperature source Oral, weight 128 lb 3.2 oz (58.2 kg).Body mass index is 17.88 kg/m.  General Appearance: Casual  Eye Contact:  Fair  Speech:  Clear and Coherent  Volume:  Normal  Mood:  Anxious  Affect:  Congruent  Thought Process:  Goal Directed and Descriptions of Associations: Intact  Orientation:  Full (Time, Place, and Person)  Thought Content: Logical   Suicidal Thoughts:  No  Homicidal Thoughts:  No  Memory:  Immediate;   Fair Recent;   Fair Remote;   Fair  Judgement:  Fair  Insight:  Fair  Psychomotor Activity:  Normal  Concentration:  Concentration: Fair and Attention Span: Fair  Recall:  [employer]  Fund of Knowledge: Fair  Language: Fair  Akathisia:  No  Handed:  Right  AIMS (if indicated):NA  Assets:  Communication Skills Desire for Improvement Housing Social Support Talents/Skills Transportation Vocational/Educational  ADL's:  Intact  Cognition: WNL   Sleep:  Poor   Screenings:   Assessment and Plan: Kristy Shaw is a 41 year old Caucasian female who has a history of depression, obstructive sleep apnea, relational stressors who presented to the clinic for a follow-up visit.  She continues to improve on the current combination of medication.  She continues to have some struggles with sleep issues and continues to be not compliant with CPAP.  Discussed medication management, provided supportive therapy, discussed sleep hygiene.  Plan as noted below.  Plan For depression Continue Effexor XR 150 mg p.o. daily. Continue gabapentin 300 mg p.o. twice daily  For sleep problems Discussed sleep hygiene Increase trazodone to 50-75 mg p.o. nightly as needed.  Discussed with her to take it early to avoid grogginess in the morning. Provided education about being compliant on CPAP machine for her OSA.   Tobacco  use disorder Provided smoking cessation counseling.  She reports she has been successful in cutting down and agrees to use nicotine gums.  For cannabis use She continues to cut down.  Pending TSH, iron panel.  Provided medication education.  Provided supportive psychotherapy spent 15 minutes doing so.  Follow-up in 2-3 months or sooner if needed  More than 50 % of the time was spent for psychoeducation and supportive psychotherapy and care coordination.  This note was generated in part or whole with voice recognition software. Voice recognition is usually quite accurate but there are transcription errors that can and very often do occur. I apologize for any typographical errors that were not detected and corrected.        Jomarie LongsSaramma Sears Oran, MD 12/07/2017, 12:04 PM

## 2018-02-21 ENCOUNTER — Encounter: Payer: Self-pay | Admitting: Psychiatry

## 2018-02-21 ENCOUNTER — Other Ambulatory Visit: Payer: Self-pay

## 2018-02-21 ENCOUNTER — Ambulatory Visit (INDEPENDENT_AMBULATORY_CARE_PROVIDER_SITE_OTHER): Payer: 59 | Admitting: Psychiatry

## 2018-02-21 VITALS — BP 108/73 | HR 71 | Temp 97.9°F | Wt 131.8 lb

## 2018-02-21 DIAGNOSIS — F341 Dysthymic disorder: Secondary | ICD-10-CM

## 2018-02-21 DIAGNOSIS — F17200 Nicotine dependence, unspecified, uncomplicated: Secondary | ICD-10-CM | POA: Diagnosis not present

## 2018-02-21 DIAGNOSIS — Z9989 Dependence on other enabling machines and devices: Secondary | ICD-10-CM | POA: Diagnosis not present

## 2018-02-21 DIAGNOSIS — G4733 Obstructive sleep apnea (adult) (pediatric): Secondary | ICD-10-CM

## 2018-02-21 DIAGNOSIS — F5105 Insomnia due to other mental disorder: Secondary | ICD-10-CM

## 2018-02-21 MED ORDER — VENLAFAXINE HCL ER 150 MG PO CP24: 150.0000 mg | ORAL_CAPSULE | Freq: Every day | ORAL | 1 refills | Status: DC

## 2018-02-21 NOTE — Progress Notes (Signed)
BH MD  OP Progress Note  02/21/2018 1:29 PM Kristy Shaw  MRN:  [id number]  Chief Complaint: ' I am better.' Chief Complaint    Follow-up; Medication Refill     HPI: Kristy Shaw is a 42 year-old Caucasian female who is married, lives in [place] with her husband and 37-year-old child, has a history of depression, fibromyalgia, OSA who presented to the clinic today for a follow-up visit.  Pt today reports that she has noticed her symptoms as improving.  She reports she is less anxious and less depressed at this time.  She is compliant on her Effexor as well as the gabapentin.  She reports the gabapentin also helps with her pain.  She also reports that she is currently on other medications to help with her pain.  She was recently started on prednisone tapering dose and meloxicam as well as she will be starting physical therapy soon.  She reports she has  been feeling better with her pain once she has been on this medication regimen.  She continues to be noncompliant with her CPAP since she is looking forward to buying this cleaning machine to clean her CPAP.  She reports she will start using it as soon as she gets that cleaning done.  She reports sleep as going well on the gabapentin and her pain medications.  She reports she stopped taking the trazodone since it made her groggy and gave her a drunken feeling in the morning.  Patient reports she continues to have a good relationship with her husband who is supportive.  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  She reports her sister who was struggling with mental health issues as well as substance abuse problems currently is going in the right direction and trying to get help.  She reports she and her sister got back together after a very long time last weekend and that was a big change for her.  She reports she is happy about the same.  She  is trying to quit smoking but has not been successful doing so.  She reports she has got  Nicorette gums and she does have to start using it. Visit Diagnosis:    ICD-10-CM   1. Persistent depressive disorder with mixed features, currently moderate F34.1 venlafaxine XR (EFFEXOR-XR) 150 MG 24 hr capsule  2. Insomnia due to mental disorder F51.05   3. OSA on CPAP G47.33    Z99.89   4. Tobacco use disorder F17.200     Past Psychiatric History: Depression, anxiety, mood disorder.  She denies history of suicide attempts.  She denies past inpatient mental health admissions for  Past Medical History:  Past Medical History:  Diagnosis Date  . Allergy    seasonal  . Anemia    LAST HGB 12.8 ON 01-31-17  . Anxiety   . Asthma    WELL CONTROLLED  . Breast mass, left 2017   PASH  . 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 Depression   . Dyspnea   . Dysrhythmia    IRREGULAR HEART BEAT  . Family history of adverse reaction to anesthesia    DAD-STOKE COMING OUT OF ANESTHESIA   . Fatigue   . Fibromyalgia   . GERD (gastroesophageal reflux disease)    OCC  . IBS (irritable bowel syndrome)   . Mitral valve prolapse   . Restless leg syndrome   . Sleep apnea    HAS CPAP MACHINE BUT DOES NOT USE  ON A REGULAR BASIS    Past Surgical History:  Procedure Laterality Date  . BREAST LUMPECTOMY WITH RADIOACTIVE SEED LOCALIZATION Left 06/07/2016   Procedure: LEFT BREAST LUMPECTOMY WITH RADIOACTIVE SEED LOCALIZATION;  Surgeon: Chevis PrettyPaul Toth III, MD;  Location: [facility];  Service: General;  Laterality: Left;  . DILATION AND CURETTAGE OF UTERUS    . HEMORRHOID SURGERY N/A 03/24/2017   Procedure: EXCISION ANORECTAL POLYP;  Surgeon: Kieth BrightlySeeplaputhur G Sankar, MD;  Location: [facility];  Service: General;  Laterality: N/A;  . NASAL SEPTUM SURGERY      Family Psychiatric History: Sister has mental health issues, history of ADD in brother, depression in father.  Family History:  Family History  Problem Relation Age of Onset  . Hypertension Mother   . Stroke Mother   . Heart attack Father    . Stroke Father   . Depression Father   . Depression Sister   . Kidney disease Brother   . ADD / ADHD Brother   . Asthma Brother   . Breast cancer Maternal Grandmother 60   Substance abuse history: Cannabis use occasional.  Social History: Jill Sidelison has bachelor's degree in criminal justice.  She is divorced once.  She is married currently, lives with her husband and 43-year-old child.  She does housekeeping jobs.  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 Social History   Socioeconomic History  . Marital status: Married    Spouse name: None  . Number of children: None  . Years of education: None  . Highest education level: None  Social Needs  . Financial resource strain: None  . Food insecurity - worry: None  . Food insecurity - inability: None  . Transportation needs - medical: None  . Transportation needs - non-medical: None  Occupational History  . None  Tobacco Use  . Smoking status: Current Every Day Smoker    Packs/day: 0.50    Years: 26.00    Pack years: 13.00    Types: Cigarettes    Start date: 09/23/1990  . Smokeless tobacco: Never Used  Substance and Sexual Activity  . Alcohol use: No    Alcohol/week: 0.0 oz  . Drug use: Yes    Types: Marijuana    Comment: OCC  . Sexual activity: Yes    Birth control/protection: None  Other Topics Concern  . None  Social History Narrative  . None    Allergies:  Allergies  Allergen Reactions  . Adhesive [Tape]   . Doxycycline Rash    Metabolic Disorder Labs: No results found for: HGBA1C, MPG No results found for: PROLACTIN No results found for: CHOL, TRIG, HDL, CHOLHDL, VLDL, LDLCALC No results found for: TSH  Therapeutic Level Labs: No results found for: LITHIUM No results found for: VALPROATE No components found for:  CBMZ  Current Medications: Current Outpatient Medications  Medication Sig Dispense Refill  . albuterol (PROAIR HFA) 108 (90 BASE) MCG/ACT inhaler Inhale into the lungs every 6 (six) hours as needed.      . cetirizine (ZYRTEC) 10 MG tablet Take 10 mg by mouth daily as needed for allergies.     . fluticasone (FLONASE) 50 MCG/ACT nasal spray Place 1 spray into both nostrils daily as needed for allergies.     Marland Kitchen. gabapentin (NEURONTIN) 300 MG capsule Take 1 capsule (300 mg total) by mouth 2 (two) times daily. 180 capsule 1  . omeprazole (PRILOSEC) 20 MG capsule Take 20 mg by mouth daily as needed.     . venlafaxine XR (EFFEXOR-XR) 150  MG 24 hr capsule Take 1 capsule (150 mg total) by mouth daily with breakfast. 90 capsule 1   No current facility-administered medications for this visit.      Musculoskeletal: Strength & Muscle Tone: within normal limits Gait & Station: normal Patient leans: N/A  Psychiatric Specialty Exam: Review of Systems  Psychiatric/Behavioral: Positive for depression (improving). The patient is nervous/anxious (improving).   All other systems reviewed and are negative.   Blood pressure 108/73, pulse 71, temperature 97.9 F (36.6 C), temperature source Oral, weight 131 lb 12.8 oz (59.8 kg).Body mass index is 18.38 kg/m.  General Appearance: Casual  Eye Contact:  Fair  Speech:  Normal Rate  Volume:  Normal  Mood:  Euthymic  Affect:  Congruent  Thought Process:  Goal Directed and Descriptions of Associations: Intact  Orientation:  Full (Time, Place, and Person)  Thought Content: Logical   Suicidal Thoughts:  No  Homicidal Thoughts:  No  Memory:  Immediate;   Fair Recent;   Fair Remote;   Fair  Judgement:  Fair  Insight:  Fair  Psychomotor Activity:  Normal  Concentration:  Concentration: Fair and Attention Span: Fair  Recall:  [employer] of Knowledge: Fair  Language: Fair  Akathisia:  No  Handed:  Right  AIMS (if indicated): na  Assets:  Communication Skills Desire for Improvement Housing Intimacy Resilience Social Support Talents/Skills Transportation Vocational/Educational  ADL's:  Intact  Cognition: WNL  Sleep:  Fair    Screenings:   Assessment and Plan: Floriene is a 42 year old Caucasian female who has a history of depression, OSA, relational stressors, presented to the clinic today for a follow-up visit.  She reports she is currently doing well on the current medication regimen.  She continues to have good social support system.  Discussed plan as noted below.  Plan For depression Continue Effexor XR 150 mg p.o. daily Continue gabapentin 300 mg p.o. twice daily  Sleep problems Discussed sleep hygiene Discontinue trazodone for side effects. She reports she is sleeping well on the gabapentin as well as her pain medications. Provided education about being compliant on CPAP machine for her OSA.  Tobacco use disorder Provided smoking cessation counseling.  She reports she is trying to cut down and wants to start using the nicotine gums OTC.  For cannabis use Occasional use, we will continue to monitor.  Provided medication education, provided handouts.  Pending TSH, iron panel.  Follow-up in clinic in 2 months or sooner if needed.  More than 50 % of the time was spent for psychoeducation and supportive psychotherapy and care coordination.  This note was generated in part or whole with voice recognition software. Voice recognition is usually quite accurate but there are transcription errors that can and very often do occur. I apologize for any typographical errors that were not detected and corrected.       [name], MD 02/21/2018, 1:29 PM

## 2018-03-01 ENCOUNTER — Encounter: Payer: Self-pay | Admitting: Physical Therapy

## 2018-03-01 ENCOUNTER — Ambulatory Visit: Payer: 59 | Attending: Internal Medicine | Admitting: Physical Therapy

## 2018-03-01 ENCOUNTER — Other Ambulatory Visit: Payer: Self-pay

## 2018-03-01 DIAGNOSIS — M25611 Stiffness of right shoulder, not elsewhere classified: Secondary | ICD-10-CM | POA: Diagnosis present

## 2018-03-01 DIAGNOSIS — M6281 Muscle weakness (generalized): Secondary | ICD-10-CM | POA: Insufficient documentation

## 2018-03-01 DIAGNOSIS — M25511 Pain in right shoulder: Secondary | ICD-10-CM | POA: Diagnosis present
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
## 2018-03-08 ENCOUNTER — Encounter: Payer: Self-pay | Admitting: Physical Therapy

## 2018-03-08 ENCOUNTER — Ambulatory Visit: Payer: 59 | Admitting: Physical Therapy

## 2018-03-08 DIAGNOSIS — M6281 Muscle weakness (generalized): Secondary | ICD-10-CM

## 2018-03-08 DIAGNOSIS — M25611 Stiffness of right shoulder, not elsewhere classified: Secondary | ICD-10-CM

## 2018-03-08 DIAGNOSIS — M25511 Pain in right shoulder: Secondary | ICD-10-CM | POA: Diagnosis not present

## 2018-03-09 ENCOUNTER — Encounter: Payer: Self-pay | Admitting: Physical Therapy

## 2018-03-09 ENCOUNTER — Ambulatory Visit: Payer: 59 | Admitting: Physical Therapy

## 2018-03-09 DIAGNOSIS — M25511 Pain in right shoulder: Secondary | ICD-10-CM

## 2018-03-09 DIAGNOSIS — M6281 Muscle weakness (generalized): Secondary | ICD-10-CM

## 2018-03-09 DIAGNOSIS — M25611 Stiffness of right shoulder, not elsewhere classified: Secondary | ICD-10-CM
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
## 2018-03-15 ENCOUNTER — Encounter: Payer: Self-pay | Admitting: Physical Therapy

## 2018-03-15 ENCOUNTER — Ambulatory Visit: Payer: 59 | Admitting: Physical Therapy

## 2018-03-15 DIAGNOSIS — M25611 Stiffness of right shoulder, not elsewhere classified: Secondary | ICD-10-CM

## 2018-03-15 DIAGNOSIS — M6281 Muscle weakness (generalized): Secondary | ICD-10-CM

## 2018-03-15 DIAGNOSIS — M25511 Pain in right shoulder: Secondary | ICD-10-CM | POA: Diagnosis not present
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
## 2018-03-16 ENCOUNTER — Ambulatory Visit: Payer: 59 | Admitting: Physical Therapy

## 2018-03-16 ENCOUNTER — Encounter: Payer: Self-pay | Admitting: Physical Therapy

## 2018-03-16 DIAGNOSIS — M25511 Pain in right shoulder: Secondary | ICD-10-CM

## 2018-03-16 DIAGNOSIS — M25611 Stiffness of right shoulder, not elsewhere classified: Secondary | ICD-10-CM

## 2018-03-16 DIAGNOSIS — M6281 Muscle weakness (generalized): Secondary | ICD-10-CM
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
## 2018-03-20 ENCOUNTER — Encounter: Payer: 59 | Admitting: Physical Therapy

## 2018-03-20 ENCOUNTER — Encounter: Payer: Self-pay | Admitting: Physical Therapy

## 2018-03-20 ENCOUNTER — Ambulatory Visit: Payer: 59 | Attending: Internal Medicine | Admitting: Physical Therapy

## 2018-03-20 DIAGNOSIS — M25611 Stiffness of right shoulder, not elsewhere classified: Secondary | ICD-10-CM

## 2018-03-20 DIAGNOSIS — M6281 Muscle weakness (generalized): Secondary | ICD-10-CM | POA: Diagnosis present

## 2018-03-20 DIAGNOSIS — M25511 Pain in right shoulder: Secondary | ICD-10-CM
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
## 2018-03-21 ENCOUNTER — Ambulatory Visit: Payer: 59 | Admitting: Physical Therapy

## 2018-03-23 ENCOUNTER — Encounter: Payer: Self-pay | Admitting: Physical Therapy

## 2018-03-23 ENCOUNTER — Ambulatory Visit: Payer: 59 | Admitting: Physical Therapy

## 2018-03-23 DIAGNOSIS — M25611 Stiffness of right shoulder, not elsewhere classified: Secondary | ICD-10-CM

## 2018-03-23 DIAGNOSIS — M25511 Pain in right shoulder: Secondary | ICD-10-CM | POA: Diagnosis not present

## 2018-03-23 DIAGNOSIS — M6281 Muscle weakness (generalized): Secondary | ICD-10-CM
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
## 2018-03-28 ENCOUNTER — Ambulatory Visit: Payer: 59 | Admitting: Physical Therapy

## 2018-03-28 ENCOUNTER — Encounter: Payer: Self-pay | Admitting: Physical Therapy

## 2018-03-28 DIAGNOSIS — M25511 Pain in right shoulder: Secondary | ICD-10-CM

## 2018-03-28 DIAGNOSIS — M25611 Stiffness of right shoulder, not elsewhere classified: Secondary | ICD-10-CM

## 2018-03-28 DIAGNOSIS — M6281 Muscle weakness (generalized): Secondary | ICD-10-CM
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
## 2018-03-30 ENCOUNTER — Encounter: Payer: Self-pay | Admitting: Physical Therapy

## 2018-03-30 ENCOUNTER — Ambulatory Visit: Payer: 59 | Admitting: Physical Therapy

## 2018-03-30 DIAGNOSIS — M6281 Muscle weakness (generalized): Secondary | ICD-10-CM

## 2018-03-30 DIAGNOSIS — M25511 Pain in right shoulder: Secondary | ICD-10-CM | POA: Diagnosis not present

## 2018-03-30 DIAGNOSIS — M25611 Stiffness of right shoulder, not elsewhere classified: Secondary | ICD-10-CM

## 2018-03-30 NOTE — Therapy (Signed)
[place] [facility] [address] [place], [address] Phone: [phone]   Fax:  [phone]  Physical Therapy Treatment  Patient Details  Name: Kristy Shaw MRN: [id number] Date of Birth: 1976-10-21 Referring Provider: Margaretann Loveless MD   Encounter Date: 03/30/2018  PT End of Session - 03/30/18 1956    Visit Number  9    Number of Visits  12    Date for PT Re-Evaluation  04/12/18    Authorization Type  9 of 6 FOTO    PT Start Time  1645    PT Stop Time  1715    PT Time Calculation (min)  30 min    Activity Tolerance  Patient tolerated treatment well    Behavior During Therapy  [facility] for tasks assessed/performed       Past Medical History:  Diagnosis Date  . Allergy    seasonal  . Anemia    LAST HGB 12.8 ON 01-31-17  . Anxiety   . Asthma    WELL CONTROLLED  . Breast mass, left 2017   PASH  . 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 Depression   . Dyspnea   . Dysrhythmia    IRREGULAR HEART BEAT  . Family history of adverse reaction to anesthesia    DAD-STOKE COMING OUT OF ANESTHESIA   . Fatigue   . Fibromyalgia   . GERD (gastroesophageal reflux disease)    OCC  . IBS (irritable bowel syndrome)   . Mitral valve prolapse   . Restless leg syndrome   . Sleep apnea    HAS CPAP MACHINE BUT DOES NOT USE ON A REGULAR BASIS    Past Surgical History:  Procedure Laterality Date  . BREAST LUMPECTOMY WITH RADIOACTIVE SEED LOCALIZATION Left 06/07/2016   Procedure: LEFT BREAST LUMPECTOMY WITH RADIOACTIVE SEED LOCALIZATION;  Surgeon: [name], MD;  Location: [facility];  Service: General;  Laterality: Left;  . DILATION AND CURETTAGE OF UTERUS    . HEMORRHOID SURGERY N/A 03/24/2017   Procedure: EXCISION ANORECTAL POLYP;  Surgeon: Kieth Brightly, MD;  Location: [facility];  Service: General;  Laterality: N/A;  . NASAL SEPTUM SURGERY      There were no vitals filed for this  visit.  Subjective Assessment - 03/30/18 1955    Subjective  increased soreness all over today. may be a flare up of fibromyalgia    Pertinent History  History of injury to right shoulder at work while lifting about 50# overhead ~ 20 years ago and then she was hit in her shoulder at about age 42. She has had episodes of pain and difficulty with shoulder since and consistent pain over the past 5 years.     Limitations  Lifting;House hold activities;Other (comment) overhead activities    Patient Stated Goals  to be able to perform acctivities without shoulder and back pain    Currently in Pain?  Yes    Pain Score  8     Pain Location  Shoulder    Pain Orientation  Right    Pain Descriptors / Indicators  Aching;Spasm;Sore    Pain Type  Chronic pain    Pain Onset  More than a month ago    Pain Frequency  Intermittent         Objective: palpation: point tenderness right shoulder anterior aspect and increased spasm upper trapezius and along medial border of scapula Posture: forward rounded right shoulder, hiking  right shoulder   Treatment: Manual therapy: 10 min goal: spasms and pain right shoulder Patient seated in chair, right UE supported; STM performed with superficial and compression techniques to upper trapezius, anterior aspect of shoulder   Therapeutic exercise: patient performed with instruction, demonstration, verbal and tactile cues of therapist: goal: independent home program  Supine lying: AAROM right shoulder forward elevation x 10 RS right UE shoulder 90 degrees flexion 3 x 10 reps RS right shoulder rotations with arm at side neutral rotations 3 x 10  Side lying:  Scapular control elevation and depression with isometric hold at end range depression with focus on lower trapezius activation x 10 reps  Patient response to treatment: improved pain level from 8/10 to 3/10 with treatment. Patient improved technique with exercises with minimal cuing and repetition.  iDecreased tenderness anterior right shoulder by 50% following STM to upper trapezius and right shoulder        PT Education - 03/30/18 1732    Education provided  Yes    Education Details  exercise instruction    Person(s) Educated  Patient    Methods  Explanation;Demonstration;Verbal cues    Comprehension  Verbal cues required;Returned demonstration;Verbalized understanding          PT Long Term Goals - 03/01/18 1145      PT LONG TERM GOAL #1   Title  Patient will demonstrate improved functional use right UE with less difficulty and pain as indicated by FOTO score of 55    Baseline  FOTO 47    Status  New    Target Date  03/22/18      PT LONG TERM GOAL #2   Title  Patient will demonstrate improved functional use right UE with less difficulty and pain as indicated by FOTO score of 60    Baseline  FOTO 47    Status  New    Target Date  04/12/18      PT LONG TERM GOAL #3   Title  Patient will be independent with home program for strength, flexibility and pain control to transition to self management by discharge from physical therapy     Baseline  limited knowledge of appriate pain control strategies and exercise progression without guidance, instruction    Status  New    Target Date  04/12/18            Plan - 03/30/18 1957    Clinical Impression Statement  Patient reported pain in right shoulder decreased from 8/10 to 3/10 following treatment. She is progressing steadily towards goals.    Rehab Potential  Good    Clinical Impairments Affecting Rehab Potential  (+)age, motivated(-)multiple co morbidities, chronic condition    PT Frequency  2x / week    PT Duration  6 weeks    PT Treatment/Interventions  Electrical Stimulation;Cryotherapy;Ultrasound;Moist Heat;Iontophoresis 4mg /ml Dexamethasone;Therapeutic activities;Therapeutic exercise;Patient/family education;Neuromuscular re-education;Manual techniques    PT Next Visit Plan  pain control, exercise progression     PT Home Exercise Plan  posture awareness, scapular retraction, resistive scapualr retraction with band, body mechanics for daily activities       Patient will benefit from skilled therapeutic intervention in order to improve the following deficits and impairments:  Pain, Increased muscle spasms, Decreased activity tolerance, Decreased endurance, Decreased range of motion, Decreased strength, Impaired perceived functional ability, Impaired UE functional use  Visit Diagnosis: Right shoulder pain, unspecified chronicity  Muscle weakness (generalized)  Stiffness of right shoulder, not elsewhere classified     Problem  List Patient Active Problem List   Diagnosis Date Noted  . Fibromyalgia 10/12/2017  . History of ovarian cyst 03/07/2017  . Vaginal bleeding in pregnancy 03/07/2017  . 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 Anxiety 06/10/2014  . Airway hyperreactivity 06/10/2014  . Clinical depression 06/10/2014  . Gastroduodenal ulcer 06/10/2014  . Reflux 06/10/2014    Beacher May PT 03/31/2018, 11:06 AM  [place] [facility] PH[facility] AND SPORTS MEDICINE [address] [place], [address] Phone: [phone]   Fax:  [phone]  Name: Kristy Shaw MRN: [id number] Date of Birth: Jan 12, 1976

## 2018-04-06 ENCOUNTER — Ambulatory Visit: Payer: 59 | Admitting: Physical Therapy

## 2018-04-06 ENCOUNTER — Encounter: Payer: Self-pay | Admitting: Physical Therapy

## 2018-04-06 DIAGNOSIS — M25511 Pain in right shoulder: Secondary | ICD-10-CM | POA: Diagnosis not present

## 2018-04-06 DIAGNOSIS — M6281 Muscle weakness (generalized): Secondary | ICD-10-CM

## 2018-04-06 DIAGNOSIS — M25611 Stiffness of right shoulder, not elsewhere classified: Secondary | ICD-10-CM

## 2018-04-06 NOTE — Therapy (Signed)
[place] [facility]TS MEDICINE [address] [place], [address] Phone: [phone]   Fax:  [phone]  Physical Therapy Treatment  Patient Details  Name: AVAREE GILBERTI MRN: [id number] Date of Birth: September 18, 1976 Referring Provider: Margaretann Loveless MD   Encounter Date: 04/06/2018  PT End of Session - 04/06/18 1204    Visit Number  10    Number of Visits  12    Date for PT Re-Evaluation  04/12/18    Authorization Type  10 of 6 FOTO    PT Start Time  1158    PT Stop Time  1230    PT Time Calculation (min)  32 min    Activity Tolerance  Patient tolerated treatment well    Behavior During Therapy  [facility] for tasks assessed/performed       Past Medical History:  Diagnosis Date  . Allergy    seasonal  . Anemia    LAST HGB 12.8 ON 01-31-17  . Anxiety   . Asthma    WELL CONTROLLED  . Breast mass, left 2017   PASH  . 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 Depression   . Dyspnea   . Dysrhythmia    IRREGULAR HEART BEAT  . Family history of adverse reaction to anesthesia    DAD-STOKE COMING OUT OF ANESTHESIA   . Fatigue   . Fibromyalgia   . GERD (gastroesophageal reflux disease)    OCC  . IBS (irritable bowel syndrome)   . Mitral valve prolapse   . Restless leg syndrome   . Sleep apnea    HAS CPAP MACHINE BUT DOES NOT USE ON A REGULAR BASIS    Past Surgical History:  Procedure Laterality Date  . BREAST LUMPECTOMY WITH RADIOACTIVE SEED LOCALIZATION Left 06/07/2016   Procedure: LEFT BREAST LUMPECTOMY WITH RADIOACTIVE SEED LOCALIZATION;  Surgeon: [name], MD;  Location: [facility];  Service: General;  Laterality: Left;  . DILATION AND CURETTAGE OF UTERUS    . HEMORRHOID SURGERY N/A 03/24/2017   Procedure: EXCISION ANORECTAL POLYP;  Surgeon: Kieth Brightly, MD;  Location: [facility];  Service: General;  Laterality: N/A;  . NASAL SEPTUM SURGERY      There were no vitals filed for this  visit.  Subjective Assessment - 04/06/18 1201    Subjective  Patient reports she has had a busy day this morning and did not get her echocardiogram done and had to wait too long. She reports she is sore in right shoulder today     Pertinent History  History of injury to right shoulder at work while lifting about 50# overhead ~ 20 years ago and then she was hit in her shoulder at about age 63. She has had episodes of pain and difficulty with shoulder since and consistent pain over the past 5 years.     Limitations  Lifting;House hold activities;Other (comment) overhead activities    Patient Stated Goals  to be able to perform acctivities without shoulder and back pain    Currently in Pain?  Yes    Pain Score  4     Pain Location  Shoulder    Pain Orientation  Right    Pain Descriptors / Indicators  Aching;Spasm;Sore    Pain Type  Chronic pain    Pain Onset  More than a month ago    Pain Frequency  Intermittent       Posture: forward rounded right shoulder with  mild winging of scapula Strength: decreased strength right periscapular muscles, rhomboids, lower trapezius muscles    Treatment:   Therapeutic exercise: patient performed with instruction, demonstration, verbal and tactile cues of therapist: goal: independent home program  standing: UE ranger on floor and on wall forward elevation and rotations: multipe sets and reps UE ranger on floor with 3# weight on hand forward elevation with tactile cues for stabilizing scapula 3 x 10 reps seated scapular rows; 10# x 15, 10# x 8 standing straight arm pull downs 15# x 15, 10# x 10 standing at wall pizza carry with 3# weight x 15 reps standing at wall scaption through partial ROM with 2# weight and without weight with guidance for short arc to avoid popping/grinding in shoulder   Patient response to treatment: improved technique with exercises with moderate cuing and tactile cues for stabilization of scapula.     PT Education - 04/06/18  1204    Education provided  Yes    Education Details  exercise instruction    Person(s) Educated  Patient    Methods  Explanation;Demonstration;Verbal cues    Comprehension  Verbal cues required;Returned demonstration;Verbalized understanding          PT Long Term Goals - 03/01/18 1145      PT LONG TERM GOAL #1   Title  Patient will demonstrate improved functional use right UE with less difficulty and pain as indicated by FOTO score of 55    Baseline  FOTO 47    Status  New    Target Date  03/22/18      PT LONG TERM GOAL #2   Title  Patient will demonstrate improved functional use right UE with less difficulty and pain as indicated by FOTO score of 60    Baseline  FOTO 47    Status  New    Target Date  04/12/18      PT LONG TERM GOAL #3   Title  Patient will be independent with home program for strength, flexibility and pain control to transition to self management by discharge from physical therapy     Baseline  limited knowledge of appriate pain control strategies and exercise progression without guidance, instruction    Status  New    Target Date  04/12/18            Plan - 04/06/18 1205    Clinical Impression Statement  Patient is progressing well with treatment with imrpoving strength right shouler and 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 She continues with limited strength in right shoulder and periscapular muscles, requires tactile and verbal cuing for exercise and will benefit from continued physical therapy intervention to be able to transition to independent home program.     Rehab Potential  Good    Clinical Impairments Affecting Rehab Potential  (+)age, motivated(-)multiple co morbidities, chronic condition    PT Frequency  2x / week    PT Duration  6 weeks    PT Treatment/Interventions  Electrical Stimulation;Cryotherapy;Ultrasound;Moist Heat;Iontophoresis 4mg /ml Dexamethasone;Therapeutic activities;Therapeutic exercise;Patient/family  education;Neuromuscular re-education;Manual techniques    PT Next Visit Plan  pain control, exercise progression    PT Home Exercise Plan  posture awareness, scapular retraction, resistive scapualr retraction with band, body mechanics for daily activities       Patient will benefit from skilled therapeutic intervention in order to improve the following deficits and impairments:  Pain, Increased muscle spasms, Decreased activity tolerance, Decreased endurance, Decreased range of motion, Decreased strength, Impaired perceived functional  ability, Impaired UE functional use  Visit Diagnosis: Right shoulder pain, unspecified chronicity  Muscle weakness (generalized)  Stiffness of right shoulder, not elsewhere classified     Problem List Patient Active Problem List   Diagnosis Date Noted  . Fibromyalgia 10/12/2017  . History of ovarian cyst 03/07/2017  . Vaginal bleeding in pregnancy 03/07/2017  . 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 Anxiety 06/10/2014  . Airway hyperreactivity 06/10/2014  . Clinical depression 06/10/2014  . Gastroduodenal ulcer 06/10/2014  . Reflux 06/10/2014    Beacher May PT 04/06/2018, 12:59 PM   [facility] PHYSICAL AND SPORTS MEDICINE [address] [place], [address] Phone: [phone]   Fax:  [phone]  Name: KYNISHA MEMON MRN: [id number] Date of Birth: [birth year]

## 2018-04-12 ENCOUNTER — Encounter: Payer: Self-pay | Admitting: Physical Therapy

## 2018-04-12 ENCOUNTER — Ambulatory Visit: Payer: 59 | Admitting: Physical Therapy

## 2018-04-12 DIAGNOSIS — M25611 Stiffness of right shoulder, not elsewhere classified: Secondary | ICD-10-CM

## 2018-04-12 DIAGNOSIS — M25511 Pain in right shoulder: Secondary | ICD-10-CM

## 2018-04-12 DIAGNOSIS — M6281 Muscle weakness (generalized): Secondary | ICD-10-CM

## 2018-04-12 NOTE — Therapy (Signed)
[facility] [address] [place], [address] Phone: [phone]   Fax:  [phone]  Physical Therapy Treatment  Patient Details  Name: Kristy Shaw MRN: [id number] Date of Birth: 11/23/1976 Referring Provider: Margaretann Loveless MD   Encounter Date: 04/12/2018  PT End of Session - 04/12/18 1025    Visit Number  11    Number of Visits  12    Date for PT Re-Evaluation  04/12/18    Authorization Type  11 of 6 FOTO    PT Start Time  0955    PT Stop Time  1025    PT Time Calculation (min)  30 min    Activity Tolerance  Patient tolerated treatment well    Behavior During Therapy  [facility] for tasks assessed/performed       Past Medical History:  Diagnosis Date  . Allergy    seasonal  . Anemia    LAST HGB 12.8 ON 01-31-17  . Anxiety   . Asthma    WELL CONTROLLED  . Breast mass, left 2017   PASH  . 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 Depression   . Dyspnea   . Dysrhythmia    IRREGULAR HEART BEAT  . Family history of adverse reaction to anesthesia    DAD-STOKE COMING OUT OF ANESTHESIA   . Fatigue   . Fibromyalgia   . GERD (gastroesophageal reflux disease)    OCC  . IBS (irritable bowel syndrome)   . Mitral valve prolapse   . Restless leg syndrome   . Sleep apnea    HAS CPAP MACHINE BUT DOES NOT USE ON A REGULAR BASIS    Past Surgical History:  Procedure Laterality Date  . BREAST LUMPECTOMY WITH RADIOACTIVE SEED LOCALIZATION Left 06/07/2016   Procedure: LEFT BREAST LUMPECTOMY WITH RADIOACTIVE SEED LOCALIZATION;  Surgeon: [name], MD;  Location: [facility];  Service: General;  Laterality: Left;  . DILATION AND CURETTAGE OF UTERUS    . HEMORRHOID SURGERY N/A 03/24/2017   Procedure: EXCISION ANORECTAL POLYP;  Surgeon: Kieth Brightly, MD;  Location: [facility];  Service: General;  Laterality: N/A;  . NASAL SEPTUM SURGERY      There were no vitals filed for this  visit.  Subjective Assessment - 04/12/18 0954    Subjective  Patient reports she is having soreness in right shoulder today.    Pertinent History  History of injury to right shoulder at work while lifting about 50# overhead ~ 20 years ago and then she was hit in her shoulder at about age 42. She has had episodes of pain and difficulty with shoulder since and consistent pain over the past 5 years.     Limitations  Lifting;House hold activities;Other (comment) overhead activities    Patient Stated Goals  to be able to perform acctivities without shoulder and back pain    Currently in Pain?  Yes    Pain Score  5     Pain Location  Shoulder    Pain Orientation  Anterior    Pain Descriptors / Indicators  Aching;Spasm    Pain Onset  More than a month ago    Pain Frequency  Intermittent         [name]  Objective: palpation: spasms right upper trapezius, tender anterior aspect right shoulder Posture: forward rounded shoulder with hiking right UE   Treatment:  Modalities: Ultrasound: 8 min. right shoulder pulsed @ 50%;  patient seated in chair with right UE supported   Therapeutic exercise: patient performed with instruction, demonstration, verbal and tactile cues of therapist: goal: independent home program   Supine lying: AAROM right shoulder forward elevation x 10 RS right UE shoulder 90 degrees flexion 3 x 10 reps RS right shoulder rotations with arm at side neutral rotations 3 x 10    Patient response to treatment:  improved soft tissue elasticity by 50% and decreased pain to mild at end of session, <3/10      PT Education - 04/12/18 1014    Education provided  Yes    Education Details  re assessed HEP    Person(s) Educated  Patient    Methods  Explanation    Comprehension  Verbalized understanding          PT Long Term Goals - 03/01/18 1145      PT LONG TERM GOAL #1   Title  Patient will demonstrate improved functional use right UE with less difficulty and  pain as indicated by FOTO score of 55    Baseline  FOTO 47    Status  New    Target Date  03/22/18      PT LONG TERM GOAL #2   Title  Patient will demonstrate improved functional use right UE with less difficulty and pain as indicated by FOTO score of 60    Baseline  FOTO 47    Status  New    Target Date  04/12/18      PT LONG TERM GOAL #3   Title  Patient will be independent with home program for strength, flexibility and pain control to transition to self management by discharge from physical therapy     Baseline  limited knowledge of appriate pain control strategies and exercise progression without guidance, instruction    Status  New    Target Date  04/12/18            Plan - 04/12/18 1025    Clinical Impression Statement  Patient demonstrated decreased pain in right shoulder with treatment and is progressing well with goals.     Rehab Potential  Good    Clinical Impairments Affecting Rehab Potential  (+)age, motivated(-)multiple co morbidities, chronic condition    PT Frequency  2x / week    PT Duration  6 weeks    PT Treatment/Interventions  Electrical Stimulation;Cryotherapy;Ultrasound;Moist Heat;Iontophoresis 4mg /ml Dexamethasone;Therapeutic activities;Therapeutic exercise;Patient/family education;Neuromuscular re-education;Manual techniques    PT Next Visit Plan  pain control, exercise progression    PT Home Exercise Plan  posture awareness, scapular retraction, resistive scapualr retraction with band, body mechanics for daily activities       Patient will benefit from skilled therapeutic intervention in order to improve the following deficits and impairments:  Pain, Increased muscle spasms, Decreased activity tolerance, Decreased endurance, Decreased range of motion, Decreased strength, Impaired perceived functional ability, Impaired UE functional use  Visit Diagnosis: Right shoulder pain, unspecified chronicity  Muscle weakness (generalized)  Stiffness of right  shoulder, not elsewhere classified     Problem List Patient Active Problem List   Diagnosis Date Noted  . Fibromyalgia 10/12/2017  . History of ovarian cyst 03/07/2017  . Vaginal bleeding in pregnancy 03/07/2017  . 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 Anxiety 06/10/2014  . Airway hyperreactivity 06/10/2014  . Clinical depression 06/10/2014  . Gastroduodenal ulcer 06/10/2014  . Reflux 06/10/2014    Beacher May PT 04/12/2018, 10:37 PM  [facility]TS [facility]ICINE [address] [place], [place], [id number] Phone: [phone]   Fax:  [phone]  Name: Kristy Shaw MRN: [id number] Date of Birth: 11/13/1976

## 2018-04-18 ENCOUNTER — Ambulatory Visit: Payer: 59 | Admitting: Physical Therapy

## 2018-04-18 ENCOUNTER — Encounter: Payer: Self-pay | Admitting: Physical Therapy

## 2018-04-18 DIAGNOSIS — M25511 Pain in right shoulder: Secondary | ICD-10-CM

## 2018-04-18 DIAGNOSIS — M6281 Muscle weakness (generalized): Secondary | ICD-10-CM

## 2018-04-18 DIAGNOSIS — M25611 Stiffness of right shoulder, not elsewhere classified: Secondary | ICD-10-CM
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
## 2018-04-24 ENCOUNTER — Other Ambulatory Visit: Payer: Self-pay

## 2018-04-24 ENCOUNTER — Encounter: Payer: Self-pay | Admitting: Psychiatry

## 2018-04-24 ENCOUNTER — Ambulatory Visit (INDEPENDENT_AMBULATORY_CARE_PROVIDER_SITE_OTHER): Payer: 59 | Admitting: Psychiatry

## 2018-04-24 VITALS — BP 131/88 | HR 80 | Temp 99.0°F | Ht 71.0 in | Wt 131.2 lb

## 2018-04-24 DIAGNOSIS — F17200 Nicotine dependence, unspecified, uncomplicated: Secondary | ICD-10-CM

## 2018-04-24 DIAGNOSIS — G4733 Obstructive sleep apnea (adult) (pediatric): Secondary | ICD-10-CM | POA: Diagnosis not present

## 2018-04-24 DIAGNOSIS — F341 Dysthymic disorder: Secondary | ICD-10-CM

## 2018-04-24 DIAGNOSIS — F5105 Insomnia due to other mental disorder: Secondary | ICD-10-CM

## 2018-04-24 DIAGNOSIS — Z9989 Dependence on other enabling machines and devices: Secondary | ICD-10-CM | POA: Diagnosis not present

## 2018-04-24 MED ORDER — VARENICLINE TARTRATE 0.5 MG PO TABS: 0.5000 mg | ORAL_TABLET | Freq: Two times a day (BID) | ORAL | 2 refills | Status: DC
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
## 2018-04-24 NOTE — Patient Instructions (Signed)
Varenicline oral tablets What is this medicine? VARENICLINE (var EN i kleen) is used to help people quit smoking. It can reduce the symptoms caused by stopping smoking. It is used with a patient support program recommended by your physician. This medicine may be used for other purposes; ask your health care provider or pharmacist if you have questions. COMMON BRAND NAME(S): Chantix What should I tell my health care provider before I take this medicine? They need to know if you have any of these conditions: -bipolar disorder, depression, schizophrenia or other mental illness -heart disease -if you often drink alcohol -kidney disease -peripheral vascular disease -seizures -stroke -suicidal thoughts, plans, or attempt; a previous suicide attempt by you or a family member -an unusual or allergic reaction to varenicline, other medicines, foods, dyes, or preservatives -pregnant or trying to get pregnant -breast-feeding How should I use this medicine? Take this medicine by mouth after eating. Take with a full glass of water. Follow the directions on the prescription label. Take your doses at regular intervals. Do not take your medicine more often than directed. There are 3 ways you can use this medicine to help you quit smoking; talk to your health care professional to decide which plan is right for you: 1) you can choose a quit date and start this medicine 1 week before the quit date, or, 2) you can start taking this medicine before you choose a quit date, and then pick a quit date between day 8 and 35 days of treatment, or, 3) if you are not sure that you are able or willing to quit smoking right away, start taking this medicine and slowly decrease the amount you smoke as directed by your health care professional with the goal of being cigarette-free by week 12 of treatment. Stick to your plan; ask about support groups or other ways to help you remain cigarette-free. If you are motivated to quit  smoking and did not succeed during a previous attempt with this medicine for reasons other than side effects, or if you returned to smoking after this treatment, speak with your health care professional about whether another course of this medicine may be right for you. A special MedGuide will be given to you by the pharmacist with each prescription and refill. Be sure to read this information carefully each time. Talk to your pediatrician regarding the use of this medicine in children. This medicine is not approved for use in children. Overdosage: If you think you have taken too much of this medicine contact a poison control center or emergency room at once. NOTE: This medicine is only for you. Do not share this medicine with others. What if I miss a dose? If you miss a dose, take it as soon as you can. If it is almost time for your next dose, take only that dose. Do not take double or extra doses. What may interact with this medicine? -alcohol or any product that contains alcohol -insulin -other stop smoking aids -theophylline -warfarin This list may not describe all possible interactions. Give your health care provider a list of all the medicines, herbs, non-prescription drugs, or dietary supplements you use. Also tell them if you smoke, drink alcohol, or use illegal drugs. Some items may interact with your medicine. What should I watch for while using this medicine? Visit your doctor or health care professional for regular check ups. Ask for ongoing advice and encouragement from your doctor or healthcare professional, friends, and family to help you quit. If   you smoke while on this medication, quit again Your mouth may get dry. Chewing sugarless gum or sucking hard candy, and drinking plenty of water may help. Contact your doctor if the problem does not go away or is severe. You may get drowsy or dizzy. Do not drive, use machinery, or do anything that needs mental alertness until you know how  this medicine affects you. Do not stand or sit up quickly, especially if you are an older patient. This reduces the risk of dizzy or fainting spells. Sleepwalking can happen during treatment with this medicine, and can sometimes lead to behavior that is harmful to you, other people, or property. Stop taking this medicine and tell your doctor if you start sleepwalking or have other unusual sleep-related activity. Decrease the amount of alcoholic beverages that you drink during treatment with this medicine until you know if this medicine affects your ability to tolerate alcohol. Some people have experienced increased drunkenness (intoxication), unusual or sometimes aggressive behavior, or no memory of things that have happened (amnesia) during treatment with this medicine. The use of this medicine may increase the chance of suicidal thoughts or actions. Pay special attention to how you are responding while on this medicine. Any worsening of mood, or thoughts of suicide or dying should be reported to your health care professional right away. What side effects may I notice from receiving this medicine? Side effects that you should report to your doctor or health care professional as soon as possible: -allergic reactions like skin rash, itching or hives, swelling of the face, lips, tongue, or throat -acting aggressive, being angry or violent, or acting on dangerous impulses -breathing problems -changes in vision -chest pain or chest tightness -confusion, trouble speaking or understanding -new or worsening depression, anxiety, or panic attacks -extreme increase in activity and talking (mania) -fast, irregular heartbeat -feeling faint or lightheaded, falls -fever -pain in legs when walking -problems with balance, talking, walking -redness, blistering, peeling or loosening of the skin, including inside the mouth -ringing in ears -seeing or hearing things that aren't there  (hallucinations) -seizures -sleepwalking -sudden numbness or weakness of the face, arm or leg -thoughts about suicide or dying, or attempts to commit suicide -trouble passing urine or change in the amount of urine -unusual bleeding or bruising -unusually weak or tired Side effects that usually do not require medical attention (report to your doctor or health care professional if they continue or are bothersome): -constipation -headache -nausea, vomiting -strange dreams -stomach gas -trouble sleeping This list may not describe all possible side effects. Call your doctor for medical advice about side effects. You may report side effects to FDA at 1-800-FDA-1088. Where should I keep my medicine? Keep out of the reach of children. Store at room temperature between 15 and 30 degrees C (59 and 86 degrees F). Throw away any unused medicine after the expiration date. NOTE: This sheet is a summary. It may not cover all possible information. If you have questions about this medicine, talk to your doctor, pharmacist, or health care provider.  2018 Elsevier/Gold Standard (2015-08-21 16:14:23)  

## 2018-05-02 ENCOUNTER — Ambulatory Visit: Payer: 59 | Attending: Internal Medicine | Admitting: Physical Therapy

## 2018-05-02 ENCOUNTER — Encounter: Payer: Self-pay | Admitting: Physical Therapy

## 2018-05-02 DIAGNOSIS — M6281 Muscle weakness (generalized): Secondary | ICD-10-CM

## 2018-05-02 DIAGNOSIS — M25611 Stiffness of right shoulder, not elsewhere classified: Secondary | ICD-10-CM | POA: Diagnosis present

## 2018-05-02 DIAGNOSIS — M62838 Other muscle spasm: Secondary | ICD-10-CM | POA: Diagnosis present

## 2018-05-02 DIAGNOSIS — M25511 Pain in right shoulder: Secondary | ICD-10-CM | POA: Diagnosis present
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
## 2018-05-04 ENCOUNTER — Encounter: Payer: Self-pay | Admitting: Physical Therapy

## 2018-05-04 ENCOUNTER — Ambulatory Visit: Payer: 59 | Admitting: Physical Therapy

## 2018-05-04 DIAGNOSIS — M6281 Muscle weakness (generalized): Secondary | ICD-10-CM

## 2018-05-04 DIAGNOSIS — M25611 Stiffness of right shoulder, not elsewhere classified: Secondary | ICD-10-CM

## 2018-05-04 DIAGNOSIS — M25511 Pain in right shoulder: Secondary | ICD-10-CM
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
## 2018-05-08 ENCOUNTER — Encounter: Payer: Self-pay | Admitting: Physical Therapy

## 2018-05-08 ENCOUNTER — Ambulatory Visit: Payer: 59 | Admitting: Physical Therapy

## 2018-05-08 DIAGNOSIS — M25511 Pain in right shoulder: Secondary | ICD-10-CM

## 2018-05-08 DIAGNOSIS — M6281 Muscle weakness (generalized): Secondary | ICD-10-CM

## 2018-05-08 DIAGNOSIS — M25611 Stiffness of right shoulder, not elsewhere classified: Secondary | ICD-10-CM
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
## 2018-05-10 ENCOUNTER — Ambulatory Visit: Payer: 59 | Admitting: Physical Therapy

## 2018-05-10 ENCOUNTER — Encounter: Payer: Self-pay | Admitting: Physical Therapy

## 2018-05-10 DIAGNOSIS — M25511 Pain in right shoulder: Secondary | ICD-10-CM

## 2018-05-10 DIAGNOSIS — M6281 Muscle weakness (generalized): Secondary | ICD-10-CM

## 2018-05-10 DIAGNOSIS — M25611 Stiffness of right shoulder, not elsewhere classified: Secondary | ICD-10-CM
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
## 2018-05-16 ENCOUNTER — Encounter: Payer: Self-pay | Admitting: Physical Therapy

## 2018-05-16 ENCOUNTER — Ambulatory Visit: Payer: 59 | Admitting: Physical Therapy

## 2018-05-16 DIAGNOSIS — M6281 Muscle weakness (generalized): Secondary | ICD-10-CM

## 2018-05-16 DIAGNOSIS — M25511 Pain in right shoulder: Secondary | ICD-10-CM

## 2018-05-16 DIAGNOSIS — M62838 Other muscle spasm: Secondary | ICD-10-CM
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
## 2018-05-18 ENCOUNTER — Ambulatory Visit: Payer: 59 | Admitting: Physical Therapy

## 2018-05-18 ENCOUNTER — Encounter: Payer: Self-pay | Admitting: Physical Therapy

## 2018-05-18 DIAGNOSIS — M25511 Pain in right shoulder: Secondary | ICD-10-CM

## 2018-05-18 DIAGNOSIS — M6281 Muscle weakness (generalized): Secondary | ICD-10-CM

## 2018-05-18 DIAGNOSIS — M62838 Other muscle spasm: Secondary | ICD-10-CM
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
## 2018-05-22 ENCOUNTER — Encounter: Payer: Self-pay | Admitting: Physical Therapy

## 2018-05-22 ENCOUNTER — Ambulatory Visit: Payer: 59 | Attending: Internal Medicine | Admitting: Physical Therapy

## 2018-05-22 DIAGNOSIS — M6281 Muscle weakness (generalized): Secondary | ICD-10-CM | POA: Diagnosis present

## 2018-05-22 DIAGNOSIS — M62838 Other muscle spasm: Secondary | ICD-10-CM | POA: Insufficient documentation

## 2018-05-22 DIAGNOSIS — M25511 Pain in right shoulder: Secondary | ICD-10-CM | POA: Insufficient documentation
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
## 2018-05-24 ENCOUNTER — Ambulatory Visit: Payer: 59 | Admitting: Physical Therapy

## 2018-05-24 ENCOUNTER — Encounter: Payer: Self-pay | Admitting: Physical Therapy

## 2018-05-24 DIAGNOSIS — M62838 Other muscle spasm: Secondary | ICD-10-CM

## 2018-05-24 DIAGNOSIS — M6281 Muscle weakness (generalized): Secondary | ICD-10-CM

## 2018-05-24 DIAGNOSIS — M25511 Pain in right shoulder: Secondary | ICD-10-CM

## 2018-05-25 ENCOUNTER — Ambulatory Visit (INDEPENDENT_AMBULATORY_CARE_PROVIDER_SITE_OTHER): Payer: 59 | Admitting: Psychiatry

## 2018-05-25 ENCOUNTER — Other Ambulatory Visit: Payer: Self-pay

## 2018-05-25 ENCOUNTER — Encounter: Payer: Self-pay | Admitting: Psychiatry

## 2018-05-25 VITALS — BP 117/74 | HR 70 | Temp 98.3°F | Wt 129.4 lb

## 2018-05-25 DIAGNOSIS — F341 Dysthymic disorder: Secondary | ICD-10-CM

## 2018-05-25 DIAGNOSIS — F5105 Insomnia due to other mental disorder: Secondary | ICD-10-CM | POA: Diagnosis not present

## 2018-05-25 DIAGNOSIS — F17200 Nicotine dependence, unspecified, uncomplicated: Secondary | ICD-10-CM | POA: Diagnosis not present

## 2018-05-25 MED ORDER — GABAPENTIN 300 MG PO CAPS: 300.0000 mg | ORAL_CAPSULE | Freq: Two times a day (BID) | ORAL | 1 refills | Status: DC

## 2018-05-25 MED ORDER — VENLAFAXINE HCL ER 150 MG PO CP24: 150.0000 mg | ORAL_CAPSULE | Freq: Every day | ORAL | 1 refills | Status: DC
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
## 2018-05-30 ENCOUNTER — Ambulatory Visit: Payer: 59 | Admitting: Physical Therapy

## 2018-05-30 ENCOUNTER — Encounter: Payer: Self-pay | Admitting: Physical Therapy

## 2018-05-30 DIAGNOSIS — M25511 Pain in right shoulder: Secondary | ICD-10-CM | POA: Diagnosis not present

## 2018-05-30 DIAGNOSIS — M6281 Muscle weakness (generalized): Secondary | ICD-10-CM

## 2018-05-30 DIAGNOSIS — M62838 Other muscle spasm: Secondary | ICD-10-CM
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
## 2018-07-25 ENCOUNTER — Ambulatory Visit: Payer: 59 | Admitting: Psychiatry

## 2018-07-25 ENCOUNTER — Encounter: Payer: Self-pay | Admitting: Psychiatry

## 2018-07-25 ENCOUNTER — Other Ambulatory Visit: Payer: Self-pay

## 2018-07-25 VITALS — BP 117/77 | HR 88 | Temp 98.3°F | Wt 129.2 lb

## 2018-07-25 DIAGNOSIS — F17200 Nicotine dependence, unspecified, uncomplicated: Secondary | ICD-10-CM

## 2018-07-25 DIAGNOSIS — F341 Dysthymic disorder: Secondary | ICD-10-CM | POA: Diagnosis not present

## 2018-07-25 DIAGNOSIS — F5105 Insomnia due to other mental disorder: Secondary | ICD-10-CM | POA: Diagnosis not present

## 2018-07-25 DIAGNOSIS — G4733 Obstructive sleep apnea (adult) (pediatric): Secondary | ICD-10-CM

## 2018-07-25 DIAGNOSIS — N912 Amenorrhea, unspecified: Secondary | ICD-10-CM

## 2018-07-25 DIAGNOSIS — Z9989 Dependence on other enabling machines and devices: Secondary | ICD-10-CM

## 2018-07-25 DIAGNOSIS — Z3491 Encounter for supervision of normal pregnancy, unspecified, first trimester: Secondary | ICD-10-CM

## 2018-07-25 HISTORY — DX: Amenorrhea, unspecified: N91.2

## 2018-07-25 NOTE — Patient Instructions (Signed)
[employer] - [phone]  [email or web address]

## 2018-07-25 NOTE — Progress Notes (Signed)
BH MD  OP Progress Note  07/25/2018 1:01 PM Kristy Shaw  MRN:  [id number]  Chief Complaint: ' I am here for follow up.' Chief Complaint    Follow-up; Medication Refill; Other     HPI: Kristy Shaw is a 42 year old Caucasian female who is married, lives in [place] with her husband, presented to the clinic today for a follow-up visit.  Patient today reports she found out a week ago that she were pregnant.  She reports she is only 6 weeks now and has to go back in 2 weeks for an ultrasound.  Patient reports there are some concerns about mild abnormalities but it is too soon to say.  Patient continues to take her medications both gabapentin and Effexor.  Patient reports even though she is anxious about her pregnancy she is also excited.  She did have 3 miscarriages in the past and does not want another one and is worried about that.  She however reports she is coping okay and her husband is supportive.  Discussed with patient about pregnancy implications of Effexor and gabapentin.  Discussed with patient that gabapentin has been observed in animal reproduction studies to cause adverse events.  Provided her information about [place] antiepileptic drug pregnancy registry.  Discussed with patient that Effexor since is effective for her depressive symptoms may be continued if she also agrees.  However discussed with her that Effexor also passes the placenta and may carry risk to the fetus.  However discussed that increased risk of teratogenic effect following venlafaxine exposure during pregnancy has not been observed based on available data.  The risk of spontaneous abortion may be increased.  Also there are non-teratogenic effect in newborn following SSRI/SSRI exposure late in third trimester including respiratory distress, cyanosis, seizures, feeding difficulty, jitteriness, irritability constant crying and so on.  And this was discussed with patient.  Patient reports she wants to continue the Effexor  and would like to give some thought about continuing the gabapentin.  Visit Diagnosis:    ICD-10-CM   1. Persistent depressive disorder with mixed features, currently moderate F34.1   2. Tobacco use disorder F17.200   3. OSA on CPAP G47.33    Z99.89   4. Insomnia due to mental disorder F51.05   5. First trimester pregnancy Z34.91     Past Psychiatric History: Reviewed past psychiatric history from my progress note on 02/21/2018.  Past Medical History:  Past Medical History:  Diagnosis Date  . Allergy    seasonal  . Anemia    LAST HGB 12.8 ON 01-31-17  . Anxiety   . Asthma    WELL CONTROLLED  . Breast mass, left 2017   PASH  . 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 Depression   . Dyspnea   . Dysrhythmia    IRREGULAR HEART BEAT  . Family history of adverse reaction to anesthesia    DAD-STOKE COMING OUT OF ANESTHESIA   . Fatigue   . Fibromyalgia   . GERD (gastroesophageal reflux disease)    OCC  . IBS (irritable bowel syndrome)   . Mitral valve prolapse   . Restless leg syndrome   . Sleep apnea    HAS CPAP MACHINE BUT DOES NOT USE ON A REGULAR BASIS    Past Surgical History:  Procedure Laterality Date  . BREAST LUMPECTOMY WITH RADIOACTIVE SEED LOCALIZATION Left 06/07/2016   Procedure: LEFT BREAST LUMPECTOMY WITH RADIOACTIVE SEED LOCALIZATION;  Surgeon: [name], MD;  Location: [facility];  Service: General;  Laterality: Left;  . DILATION AND CURETTAGE OF UTERUS    . HEMORRHOID SURGERY N/A 03/24/2017   Procedure: EXCISION ANORECTAL POLYP;  Surgeon: Kieth Brightly, MD;  Location: [facility];  Service: General;  Laterality: N/A;  . NASAL SEPTUM SURGERY      Family Psychiatric History: Reviewed family psychiatric history from my progress note on 02/21/2018.  Family History:  Family History  Problem Relation Age of Onset  . Hypertension Mother   . Stroke Mother   . Heart attack Father   . Stroke Father   . Depression Father   . Depression Sister    . Kidney disease Brother   . ADD / ADHD Brother   . Asthma Brother   . Breast cancer Maternal Grandmother 78    Social History: Reviewed social history from my progress note on 02/21/2018. Social History   Socioeconomic History  . Marital status: Married    Spouse name: Not on file  . Number of children: Not on file  . Years of education: Not on file  . Highest education level: Not on file  Occupational History  . Not on file  Social Needs  . Financial resource strain: Not on file  . Food insecurity:    Worry: Not on file    Inability: Not on file  . Transportation needs:    Medical: Not on file    Non-medical: Not on file  Tobacco Use  . Smoking status: Current Every Day Smoker    Packs/day: 0.50    Years: 26.00    Pack years: 13.00    Types: Cigarettes    Start date: 09/23/1990  . Smokeless tobacco: Never Used  Substance and Sexual Activity  . Alcohol use: No    Alcohol/week: 0.0 oz  . Drug use: Yes    Types: Marijuana    Comment: OCC  . Sexual activity: Yes    Birth control/protection: None  Lifestyle  . Physical activity:    Days per week: Not on file    Minutes per session: Not on file  . Stress: Not on file  Relationships  . Social connections:    Talks on phone: Not on file    Gets together: Not on file    Attends religious service: Not on file    Active member of club or organization: Not on file    Attends meetings of clubs or organizations: Not on file    Relationship status: Not on file  Other Topics Concern  . Not on file  Social History Narrative  . Not on file    Allergies:  Allergies  Allergen Reactions  . Adhesive [Tape]   . Doxycycline Rash    Metabolic Disorder Labs: No results found for: HGBA1C, MPG No results found for: PROLACTIN No results found for: CHOL, TRIG, HDL, CHOLHDL, VLDL, LDLCALC No results found for: TSH  Therapeutic Level Labs: No results found for: LITHIUM No results found for: VALPROATE No components found  for:  CBMZ  Current Medications: Current Outpatient Medications  Medication Sig Dispense Refill  . albuterol (PROAIR HFA) 108 (90 BASE) MCG/ACT inhaler Inhale into the lungs every 6 (six) hours as needed.     . cetirizine (ZYRTEC) 10 MG tablet Take 10 mg by mouth daily as needed for allergies.     . fluticasone (FLONASE) 50 MCG/ACT nasal spray Place 1 spray into both nostrils daily as needed for allergies.     Marland Kitchen gabapentin (NEURONTIN) 300 MG capsule Take 1  capsule (300 mg total) by mouth 2 (two) times daily. 180 capsule 1  . gabapentin (NEURONTIN) 300 MG capsule TAKE 1 CAPSULE BY MOUTH TWICE A DAY    . meloxicam (MOBIC) 15 MG tablet Take 15 mg by mouth daily.    Marland Kitchen omeprazole (PRILOSEC) 20 MG capsule Take 20 mg by mouth daily as needed.     . venlafaxine XR (EFFEXOR-XR) 150 MG 24 hr capsule Take 1 capsule (150 mg total) by mouth daily with breakfast. 90 capsule 1  . venlafaxine XR (EFFEXOR-XR) 150 MG 24 hr capsule Take by mouth.     No current facility-administered medications for this visit.      Musculoskeletal: Strength & Muscle Tone: within normal limits Gait & Station: normal Patient leans: N/A  Psychiatric Specialty Exam: Review of Systems  Psychiatric/Behavioral: The patient is nervous/anxious.   All other systems reviewed and are negative.   Blood pressure 117/77, pulse 88, temperature 98.3 F (36.8 C), temperature source Oral, weight 129 lb 3.2 oz (58.6 kg).Body mass index is 18.02 kg/m.  General Appearance: Casual  Eye Contact:  Fair  Speech:  Clear and Coherent  Volume:  Normal  Mood:  Anxious  Affect:  Appropriate  Thought Process:  Goal Directed and Descriptions of Associations: Intact  Orientation:  Full (Time, Place, and Person)  Thought Content: Logical   Suicidal Thoughts:  No  Homicidal Thoughts:  No  Memory:  Immediate;   Fair Recent;   Fair Remote;   Fair  Judgement:  Fair  Insight:  Fair  Psychomotor Activity:  Normal  Concentration:   Concentration: Fair and Attention Span: Fair  Recall:  [employer] of Knowledge: Fair  Language: Fair  Akathisia:  No  Handed:  Right  AIMS (if indicated): na  Assets:  Communication Skills Desire for Improvement Social Support  ADL's:  Intact  Cognition: WNL  Sleep:  Fair   Screenings:   Assessment and Plan: Kristy Shaw is a 42 year old Caucasian female who has a history of depression, OSA, relational stressors, first trimester pregnancy, presented to the clinic today for a follow-up visit.  Patient recently found out that she was pregnant and is being followed by her OB/GYN.  Patient currently reports some situational anxiety however otherwise is doing well.  Discussed plan as noted below.  Plan  Depression Continue Effexor XR 150 mg p.o. daily.  Discussed with patient the pregnancy implications of being on medications like Effexor.  As discussed above patient is aware about the risk.  Patient reports she would like to stay on it since it has been effective for her depressive symptoms. Patient reports she continues to take gabapentin.  However discussed the pregnancy implications of gabapentin and also discussed how gabapentin has shown adverse effects in animal reproductions studies.  Patient advised to register with antiepileptic drug for pregnancy registry.  Patient given information to do so. [employer] Antieplieptic Drug Pregnancy Registry - [phone] [email or web address].     Insomnia She continues to use CPAP for OSA  For tobacco use disorder She is trying to quit.  Provided smoking cessation education.  Patient will continue to follow with OB/GYN and we will continue to coordinate care.  Patient to follow-up in clinic in 2 weeks.  More than 50 % of the time was spent for psychoeducation and supportive psychotherapy and care coordination.  This note was generated in part or whole with voice recognition software. Voice recognition is usually quite accurate  but there are transcription errors that can  and very often do occur. I apologize for any typographical errors that were not detected and corrected.       Jomarie LongsSaramma Yan Okray, MD 07/25/2018, 1:01 PM

## 2018-08-08 ENCOUNTER — Ambulatory Visit
Admission: RE | Admit: 2018-08-08 | Discharge: 2018-08-08 | Disposition: A | Discharge status: Home / Self Care | Payer: 59 | Source: Ambulatory Visit | Attending: Obstetrics and Gynecology | Admitting: Obstetrics and Gynecology

## 2018-08-08 ENCOUNTER — Ambulatory Visit: Payer: 59 | Admitting: Anesthesiology

## 2018-08-08 ENCOUNTER — Encounter: Payer: Self-pay | Admitting: *Deleted

## 2018-08-08 ENCOUNTER — Encounter
Admission: RE | Disposition: A | Discharge status: Home / Self Care | Payer: Self-pay | Source: Ambulatory Visit | Attending: Obstetrics and Gynecology

## 2018-08-08 ENCOUNTER — Other Ambulatory Visit: Payer: Self-pay

## 2018-08-08 DIAGNOSIS — F419 Anxiety disorder, unspecified: Secondary | ICD-10-CM | POA: Insufficient documentation

## 2018-08-08 DIAGNOSIS — J45909 Unspecified asthma, uncomplicated: Secondary | ICD-10-CM | POA: Insufficient documentation

## 2018-08-08 DIAGNOSIS — K219 Gastro-esophageal reflux disease without esophagitis: Secondary | ICD-10-CM | POA: Diagnosis not present

## 2018-08-08 DIAGNOSIS — K279 Peptic ulcer, site unspecified, unspecified as acute or chronic, without hemorrhage or perforation: Secondary | ICD-10-CM | POA: Diagnosis not present

## 2018-08-08 DIAGNOSIS — M797 Fibromyalgia: Secondary | ICD-10-CM | POA: Diagnosis not present

## 2018-08-08 DIAGNOSIS — F17200 Nicotine dependence, unspecified, uncomplicated: Secondary | ICD-10-CM | POA: Diagnosis not present

## 2018-08-08 DIAGNOSIS — G4730 Sleep apnea, unspecified: Secondary | ICD-10-CM | POA: Diagnosis not present

## 2018-08-08 DIAGNOSIS — O089 Unspecified complication following an ectopic and molar pregnancy: Secondary | ICD-10-CM | POA: Diagnosis not present

## 2018-08-08 DIAGNOSIS — D649 Anemia, unspecified: Secondary | ICD-10-CM | POA: Insufficient documentation

## 2018-08-08 DIAGNOSIS — F319 Bipolar disorder, unspecified: Secondary | ICD-10-CM | POA: Diagnosis not present

## 2018-08-08 DIAGNOSIS — R0602 Shortness of breath: Secondary | ICD-10-CM | POA: Diagnosis not present

## 2018-08-08 DIAGNOSIS — O021 Missed abortion: Secondary | ICD-10-CM | POA: Insufficient documentation

## 2018-08-08 HISTORY — PX: DILATION AND EVACUATION: SHX1459

## 2018-08-08 LAB — CBC
HEMATOCRIT: 35.7 % (ref 35.0–47.0)
HEMOGLOBIN: 12.4 g/dL (ref 12.0–16.0)
MCH: 32.3 pg (ref 26.0–34.0)
MCHC: 34.7 g/dL (ref 32.0–36.0)
MCV: 93.1 fL (ref 80.0–100.0)
Platelets: 213 10*3/uL (ref 150–440)
RBC: 3.83 MIL/uL (ref 3.80–5.20)
RDW: 12.7 % (ref 11.5–14.5)
WBC: 6.1 10*3/uL (ref 3.6–11.0)

## 2018-08-08 LAB — BASIC METABOLIC PANEL
ANION GAP: 6 (ref 5–15)
BUN: 5 mg/dL — ABNORMAL LOW (ref 6–20)
CALCIUM: 8.3 mg/dL — AB (ref 8.9–10.3)
CO2: 27 mmol/L (ref 22–32)
Chloride: 103 mmol/L (ref 98–111)
Creatinine, Ser: 0.57 mg/dL (ref 0.44–1.00)
GFR calc Af Amer: >60 mL/min (ref >60)
GLUCOSE: 94 mg/dL (ref 70–99)
POTASSIUM: 2.9 mmol/L — AB (ref 3.5–5.1)
Sodium: 136 mmol/L (ref 135–145)

## 2018-08-08 LAB — TYPE AND SCREEN
ABO/RH(D): A POS
Antibody Screen: NEGATIVE

## 2018-08-08 LAB — URINE DRUG SCREEN, QUALITATIVE (ARMC ONLY)
AMPHETAMINES, UR SCREEN: NOT DETECTED
Barbiturates, Ur Screen: NOT DETECTED
Cannabinoid 50 Ng, Ur ~~LOC~~: POSITIVE — AB
Cocaine Metabolite,Ur ~~LOC~~: NOT DETECTED
MDMA (ECSTASY) UR SCREEN: NOT DETECTED
METHADONE SCREEN, URINE: NOT DETECTED
Opiate, Ur Screen: NOT DETECTED
Phencyclidine (PCP) Ur S: NOT DETECTED
TRICYCLIC, UR SCREEN: NOT DETECTED

## 2018-08-08 LAB — ABO/RH: ABO/RH(D): A POS

## 2018-08-08 SURGERY — DILATION AND EVACUATION, UTERUS
Anesthesia: General | Wound class: Clean Contaminated

## 2018-08-08 MED ORDER — METHYLERGONOVINE MALEATE 0.2 MG/ML IJ SOLN
INTRAMUSCULAR | Status: AC
  Filled 2018-08-08: qty 1

## 2018-08-08 MED ORDER — PROPOFOL 10 MG/ML IV BOLUS
INTRAVENOUS | Status: DC | PRN
  Administered 2018-08-08: 200 mg via INTRAVENOUS

## 2018-08-08 MED ORDER — FAMOTIDINE 20 MG PO TABS
20.0000 mg | ORAL_TABLET | Freq: Once | ORAL | Status: AC
  Administered 2018-08-08: 20 mg via ORAL

## 2018-08-08 MED ORDER — PROPOFOL 10 MG/ML IV BOLUS
INTRAVENOUS | Status: AC
  Filled 2018-08-08: qty 20

## 2018-08-08 MED ORDER — DEXAMETHASONE SODIUM PHOSPHATE 10 MG/ML IJ SOLN
INTRAMUSCULAR | Status: DC | PRN
  Administered 2018-08-08: 5 mg via INTRAVENOUS

## 2018-08-08 MED ORDER — MIDAZOLAM HCL 5 MG/5ML IJ SOLN
INTRAMUSCULAR | Status: DC | PRN
  Administered 2018-08-08: 2 mg via INTRAVENOUS

## 2018-08-08 MED ORDER — ONDANSETRON HCL 4 MG/2ML IJ SOLN
INTRAMUSCULAR | Status: AC
  Filled 2018-08-08: qty 2

## 2018-08-08 MED ORDER — MIDAZOLAM HCL 2 MG/2ML IJ SOLN
INTRAMUSCULAR | Status: AC
  Filled 2018-08-08: qty 2

## 2018-08-08 MED ORDER — FENTANYL CITRATE (PF) 100 MCG/2ML IJ SOLN
INTRAMUSCULAR | Status: AC
  Filled 2018-08-08: qty 2

## 2018-08-08 MED ORDER — POTASSIUM CHLORIDE CRYS ER 20 MEQ PO TBCR
EXTENDED_RELEASE_TABLET | ORAL | Status: AC
  Filled 2018-08-08: qty 2

## 2018-08-08 MED ORDER — LIDOCAINE HCL (PF) 4 % IJ SOLN
INTRAMUSCULAR | Status: DC | PRN
  Administered 2018-08-08: 4 mL via INTRADERMAL

## 2018-08-08 MED ORDER — ROCURONIUM BROMIDE 50 MG/5ML IV SOLN
INTRAVENOUS | Status: AC
  Filled 2018-08-08: qty 1

## 2018-08-08 MED ORDER — FAMOTIDINE 20 MG PO TABS
ORAL_TABLET | ORAL | Status: AC
  Filled 2018-08-08: qty 1

## 2018-08-08 MED ORDER — FENTANYL CITRATE (PF) 100 MCG/2ML IJ SOLN: 25.0000 ug | INTRAMUSCULAR | Status: DC | PRN

## 2018-08-08 MED ORDER — ROCURONIUM BROMIDE 100 MG/10ML IV SOLN
INTRAVENOUS | Status: DC | PRN
  Administered 2018-08-08: 25 mg via INTRAVENOUS

## 2018-08-08 MED ORDER — LACTATED RINGERS IV SOLN
INTRAVENOUS | Status: DC
  Administered 2018-08-08: 11:00:00 via INTRAVENOUS

## 2018-08-08 MED ORDER — ONDANSETRON HCL 4 MG/2ML IJ SOLN
INTRAMUSCULAR | Status: DC | PRN
  Administered 2018-08-08: 4 mg via INTRAVENOUS

## 2018-08-08 MED ORDER — ONDANSETRON HCL 4 MG/2ML IJ SOLN: 4.0000 mg | Freq: Once | INTRAMUSCULAR | Status: DC | PRN

## 2018-08-08 MED ORDER — LIDOCAINE HCL (CARDIAC) PF 100 MG/5ML IV SOSY
PREFILLED_SYRINGE | INTRAVENOUS | Status: DC | PRN
  Administered 2018-08-08: 100 mg via INTRAVENOUS

## 2018-08-08 MED ORDER — DOXYCYCLINE HYCLATE 100 MG PO TABS
200.0000 mg | ORAL_TABLET | Freq: Once | ORAL | Status: AC
  Administered 2018-08-08: 200 mg via ORAL
  Filled 2018-08-08: qty 2

## 2018-08-08 MED ORDER — POTASSIUM CHLORIDE CRYS ER 20 MEQ PO TBCR
40.0000 meq | EXTENDED_RELEASE_TABLET | Freq: Once | ORAL | Status: AC
  Administered 2018-08-08: 40 meq via ORAL

## 2018-08-08 MED ORDER — FENTANYL CITRATE (PF) 100 MCG/2ML IJ SOLN
INTRAMUSCULAR | Status: DC | PRN
  Administered 2018-08-08: 100 ug via INTRAVENOUS

## 2018-08-08 MED ORDER — KETOROLAC TROMETHAMINE 30 MG/ML IJ SOLN
INTRAMUSCULAR | Status: DC | PRN
  Administered 2018-08-08: 30 mg via INTRAVENOUS

## 2018-08-08 MED ORDER — LACTATED RINGERS IV SOLN: INTRAVENOUS | Status: DC

## 2018-08-08 MED ORDER — SUGAMMADEX SODIUM 200 MG/2ML IV SOLN
INTRAVENOUS | Status: DC | PRN
  Administered 2018-08-08: 200 mg via INTRAVENOUS

## 2018-08-08 MED ORDER — LIDOCAINE HCL (PF) 2 % IJ SOLN
INTRAMUSCULAR | Status: AC
  Filled 2018-08-08: qty 10

## 2018-08-08 MED ORDER — SUGAMMADEX SODIUM 200 MG/2ML IV SOLN
INTRAVENOUS | Status: AC
  Filled 2018-08-08: qty 2

## 2018-08-08 MED ORDER — ONDANSETRON 4 MG PO TBDP: 4.0000 mg | ORAL_TABLET | Freq: Four times a day (QID) | ORAL | 0 refills | Status: DC | PRN

## 2018-08-08 MED ORDER — DEXAMETHASONE SODIUM PHOSPHATE 10 MG/ML IJ SOLN
INTRAMUSCULAR | Status: AC
  Filled 2018-08-08: qty 1

## 2018-08-08 MED ORDER — SILVER NITRATE-POT NITRATE 75-25 % EX MISC
CUTANEOUS | Status: AC
  Filled 2018-08-08: qty 1

## 2018-08-08 SURGICAL SUPPLY — 20 items
CATH ROBINSON RED A/P 16FR (CATHETERS) IMPLANT
FILTER UTR ASPR SPEC (MISCELLANEOUS) ×1 IMPLANT
FLTR UTR ASPR SPEC (MISCELLANEOUS) ×2
GLOVE BIO SURGEON STRL SZ7 (GLOVE) ×2 IMPLANT
GLOVE INDICATOR 7.5 STRL GRN (GLOVE) ×2 IMPLANT
GOWN STRL REUS W/ TWL LRG LVL3 (GOWN DISPOSABLE) ×2 IMPLANT
GOWN STRL REUS W/TWL LRG LVL3 (GOWN DISPOSABLE) ×2
KIT BERKELEY 1ST TRIMESTER 3/8 (MISCELLANEOUS) ×2 IMPLANT
KIT TURNOVER CYSTO (KITS) ×2 IMPLANT
PACK DNC HYST (MISCELLANEOUS) ×2 IMPLANT
PAD OB MATERNITY 4.3X12.25 (PERSONAL CARE ITEMS) ×2 IMPLANT
PAD PREP 24X41 OB/GYN DISP (PERSONAL CARE ITEMS) ×2 IMPLANT
SET BERKELEY SUCTION TUBING (SUCTIONS) ×2 IMPLANT
TOWEL OR 17X26 4PK STRL BLUE (TOWEL DISPOSABLE) ×2 IMPLANT
VACURETTE 10 RIGID CVD (CANNULA) IMPLANT
VACURETTE 6 ASPIR F TIP BERK (CANNULA) ×2 IMPLANT
VACURETTE 7MM F TIP (CANNULA)
VACURETTE 7MM F TIP STRL (CANNULA) IMPLANT
VACURETTE 8 RIGID CVD (CANNULA) IMPLANT
VACURETTE 8MM F TIP (MISCELLANEOUS) ×2 IMPLANT

## 2018-08-08 NOTE — Anesthesia Preprocedure Evaluation (Signed)
Anesthesia Evaluation  Patient identified by MRN, date of birth, ID band Patient awake    Reviewed: Allergy & Precautions, NPO status , Patient's Chart, lab work & pertinent test results  History of Anesthesia Complications (+) Family history of anesthesia reactionNegative for: history of anesthetic complications  Airway Mallampati: I  TM Distance: >3 FB Neck ROM: Full    Dental  (+) Dental Advisory Given   Pulmonary shortness of breath and with exertion, asthma , sleep apnea and Continuous Positive Airway Pressure Ventilation , Current Smoker,    breath sounds clear to auscultation       Cardiovascular negative cardio ROS  + Valvular Problems/Murmurs (no MR) MVP  Rhythm:Regular Rate:Normal     Neuro/Psych PSYCHIATRIC DISORDERS Anxiety Depression Bipolar Disorder negative neurological ROS     GI/Hepatic negative GI ROS, Neg liver ROS, PUD, GERD  Medicated and Controlled,  Endo/Other  negative endocrine ROS  Renal/GU negative Renal ROS     Musculoskeletal  (+) Fibromyalgia -  Abdominal   Peds negative pediatric ROS (+)  Hematology negative hematology ROS (+) anemia ,   Anesthesia Other Findings   Reproductive/Obstetrics LMP 05/27/16                             Anesthesia Physical  Anesthesia Plan  ASA: III  Anesthesia Plan: General   Post-op Pain Management:    Induction: Intravenous  PONV Risk Score and Plan:   Airway Management Planned: Oral ETT  Additional Equipment:   Intra-op Plan:   Post-operative Plan:   Informed Consent: I have reviewed the patients History and Physical, chart, labs and discussed the procedure including the risks, benefits and alternatives for the proposed anesthesia with the patient or authorized representative who has indicated his/her understanding and acceptance.   Dental advisory given  Plan Discussed with: CRNA and Surgeon  Anesthesia Plan  Comments: (Plan routine monitors, GA- LMA OK)        Anesthesia Quick Evaluation

## 2018-08-08 NOTE — Op Note (Signed)
Operative Report Suction Dilation and Curettage   Indications: Missed abortion   Pre-operative Diagnosis: Missed abortion at 6 weeks  Post-operative Diagnosis: same.  Procedure: 1. Suction D&C  Surgeon: Christeen DouglasBethany Feleica Fulmore   Assistant(s):  None  Anesthesia: General LMA anesthesia  Estimated Blood Loss:  20         Intraoperative medications: Toradol         Total IV Fluids: 500ml  Urine Output: not measured         Specimens: Products of conception         Complications:  None; patient tolerated the procedure well.         Disposition: PACU - hemodynamically stable.         Condition: stable  Findings: Uterus measuring 6 weeks; normal cervix, vagina, perineum.   Indication for procedure/Consents: 42 y.o. G5P1  here for scheduled surgery for the aforementioned diagnoses.   Risks of surgery were discussed with the patient including but not limited to: bleeding which may require transfusion; infection which may require antibiotics; injury to uterus or surrounding organs; intrauterine scarring which may impair future fertility; need for additional procedures including laparotomy or laparoscopy; and other postoperative/anesthesia complications. Written informed consent was obtained.    Procedure Details:   The patient was taken to the operating room where general anesthesia was administered and was found to be adequate.  After a formal and adequate timeout was performed, she was placed in the dorsal lithotomy position and examined with the above findings. She was then prepped and draped in the sterile manner.   Her bladder was catheterized for an estimated amount of clear, yellow urine. A speculum was then placed in the patient's vagina and a single tooth tenaculum was applied to the anterior lip of the cervix.    No uterine sounding was performed on this pregnant uterus. Her cervix was serially dilated to accommodate a 6 sized flexible suction curette.  A sharp curettage was then  performed until there was a gritty texture in all four quadrants.  The tenaculum was removed from the anterior lip of the cervix and the vaginal speculum was removed after noting good hemostasis. The patient tolerated the procedure well and was taken to the recovery area awake, extubated and in stable condition.  The patient will be discharged to home as per PACU criteria.  She will receive a dose of oral antibiotics prior to discharge. Routine postoperative instructions given.  She was prescribed Percocet, Ibuprofen and Colace.  She will follow up in the clinic in two weeks for postoperative evaluation.

## 2018-08-08 NOTE — Discharge Instructions (Signed)
Dilation and Curettage or Vacuum Curettage, Care After  You can take-  Ibuprofen 800 mg every 8 hours for pain and cramping Tylenol 1000 mg every 8 hours for pain  This sheet gives you information about how to care for yourself after your procedure. Your health care provider may also give you more specific instructions. If you have problems or questions, contact your health care provider. What can I expect after the procedure? After your procedure, it is common to have:  Mild pain or cramping.  Some vaginal bleeding or spotting.  These may last for up to 2 weeks after your procedure. Follow these instructions at home: Activity   Do not drive or use heavy machinery while taking prescription pain medicine.  Avoid driving for the first 24 hours after your procedure.  Take frequent, short walks, followed by rest periods, throughout the day. Ask your health care provider what activities are safe for you. After 1-2 days, you may be able to return to your normal activities.  Do not lift anything heavier than 10 lb (4.5 kg) until your health care provider approves.  For at least 2 weeks, or as long as told by your health care provider, do not: ? Douche. ? Use tampons. ? Have sexual intercourse. General instructions   Take over-the-counter and prescription medicines only as told by your health care provider. This is especially important if you take blood thinning medicine.  Do not take baths, swim, or use a hot tub until your health care provider approves. Take showers instead of baths.  Wear compression stockings as told by your health care provider. These stockings help to prevent blood clots and reduce swelling in your legs.  It is your responsibility to get the results of your procedure. Ask your health care provider, or the department performing the procedure, when your results will be ready.  Keep all follow-up visits as told by your health care provider. This is  important. Contact a health care provider if:  You have severe cramps that get worse or that do not get better with medicine.  You have severe abdominal pain.  You cannot drink fluids without vomiting.  You develop pain in a different area of your pelvis.  You have bad-smelling vaginal discharge.  You have a rash. Get help right away if:  You have vaginal bleeding that soaks more than one sanitary pad in 1 hour, for 2 hours in a row.  You pass large blood clots from your vagina.  You have a fever that is above 100.70F (38.0C).  Your abdomen feels very tender or hard.  You have chest pain.  You have shortness of breath.  You cough up blood.  You feel dizzy or light-headed.  You faint.  You have pain in your neck or shoulder area. This information is not intended to replace advice given to you by your health care provider. Make sure you discuss any questions you have with your health care provider. Document Released: 12/03/2000 Document Revised: 08/04/2016 Document Reviewed: 07/08/2016 Elsevier Interactive Patient Education  [employer]2018 Elsevier Inc.

## 2018-08-08 NOTE — Anesthesia Procedure Notes (Signed)
Procedure Name: Intubation Date/Time: 08/08/2018 12:14 PM Performed by: Bernardo Heater, CRNA Pre-anesthesia Checklist: Patient identified, Emergency Drugs available, Suction available and Patient being monitored Patient Re-evaluated:Patient Re-evaluated prior to induction Oxygen Delivery Method: Circle system utilized Preoxygenation: Pre-oxygenation with 100% oxygen Induction Type: IV induction Laryngoscope Size: Mac and 3 Grade View: Grade I Tube size: 7.0 mm Number of attempts: 1 Airway Equipment and Method: LTA kit utilized Placement Confirmation: ETT inserted through vocal cords under direct vision,  positive ETCO2 and breath sounds checked- equal and bilateral Secured at: 22 cm Tube secured with: Tape Dental Injury: Teeth and Oropharynx as per pre-operative assessment

## 2018-08-08 NOTE — Transfer of Care (Signed)
Immediate Anesthesia Transfer of Care Note  Patient: Kristy Shaw  Procedure(s) Performed: DILATATION AND EVACUATION (N/A )  Patient Location: PACU  Anesthesia Type:General  Level of Consciousness: awake, alert , oriented and patient cooperative  Airway & Oxygen Therapy: Patient Spontanous Breathing and Patient connected to nasal cannula oxygen  Post-op Assessment: Report given to RN and Post -op Vital signs reviewed and stable  Post vital signs: Reviewed and stable  Last Vitals:  Vitals Value Taken Time  BP    Temp    Pulse 66 08/08/2018 12:55 PM  Resp 9 08/08/2018 12:55 PM  SpO2 100 % 08/08/2018 12:55 PM  Vitals shown include unvalidated device data.  Last Pain:  Vitals:   08/08/18 1026  TempSrc: Oral  PainSc: 0-No pain         Complications: No apparent anesthesia complications

## 2018-08-08 NOTE — Anesthesia Post-op Follow-up Note (Signed)
Anesthesia QCDR form completed.        

## 2018-08-08 NOTE — H&P (Signed)
Kristy Shaw is a 42 y.o. female [phone] here for Follow-up ultrasound for viability   Pt story: LMP 06/03/18 with unplanned pregnancy, US for confirmation done on 07/24/18. Current 1ppd smoker, advanced maternal age and 3 previous SABs (2 recent, and one requiring D&C and blood transfusion).   Previous Imaging: 07/24/18 Uterus anteverted Single IUP seen: CRL=0.23cm (8016w6d), MSD=1.22cm (2239w0d); not CWD No FHR seen at this time Large Yolk Sac imaged Cervix long and closed Free fluid seen in PCDS Rt ovary contains 2 cysts: 1)solid with punctate calcifications=1.7cm 2)complex=1.5cm Lt ovary corpus luteum=1.3cm  Labs: Blood type: A Pos (per scanned recs 01/11/2012)  Review of Systems  Constitutional: Negative for chills and fever.  Respiratory: Negative.   Cardiovascular: Negative.   Gastrointestinal: Negative for abdominal pain.  Genitourinary:       Neg flank pain, pelvic pain, no vaginal bleeding or discharge.   Musculoskeletal: Negative.   Skin: Negative.   Neurological: Negative.   Psychiatric/Behavioral: Negative.       Exam:      Vitals:   08/07/18 1026  BP: 111/73  Pulse: 61   Body mass index is 18.51 kg/m.  Gen:41 y.A.[id number].G5P1031 female in NAD HEENT: Eyes non-icteric, Normocephalic. Resp: reg and non-labored. Cardiovascular: no pedal edema, regular rate and rhythm. Musculoskel/Extrems: normal gait, ROM wnl Abd/GI: Soft, NT Neuro: Alert and oriented x 3 Psych: thought/judgement and behavior WNL.appropriately sad and slightly tearful.   OB TVUS:  Single iup seen with large yolk sac and fetal pole  Crl= 0.21 cm=5.5 wks No fetal heart motion seen  Rt ov wnl Lt simple ov cyst =0.81 cm    Impression:   The encounter diagnosis was Missed abortion.    Plan:   Spontaneous miscarriage: Causes of spontaneous miscarriage discussed with patient, including prevalence, common causes, and the expectation that this event does not increase  the chance that she will miscarry again in the future. Emotional support given.  Management options discussed, including: Expectant, medical and surgical.  Pt has opted for: surgical management, suction D&C today. Discussed r/b/a. Will be mindful of transfusion hx.  Discussed birth control options with pt and spouse, he is planning on getting a vasectomy, Pt would like to have Depo as single dose bridge contraception until spouse is cleared.

## 2018-08-08 NOTE — Interval H&P Note (Signed)
History and Physical Interval Note:  08/08/2018 11:54 AM  Kristy HouseholderAlison M Urbanski  has presented today for surgery, with the diagnosis of [name]  The various methods of treatment have been discussed with the patient and family. After consideration of risks, benefits and other options for treatment, the patient has consented to  Procedure(s): DILATATION AND EVACUATION (N/A) as a surgical intervention .  The patient's history has been reviewed, patient examined, no change in status, stable for surgery.  I have reviewed the patient's chart and labs.  Questions were answered to the patient's satisfaction.     [name]

## 2018-08-10 ENCOUNTER — Ambulatory Visit (INDEPENDENT_AMBULATORY_CARE_PROVIDER_SITE_OTHER): Payer: 59 | Admitting: Psychiatry

## 2018-08-10 ENCOUNTER — Other Ambulatory Visit: Payer: Self-pay

## 2018-08-10 ENCOUNTER — Encounter: Payer: Self-pay | Admitting: Psychiatry

## 2018-08-10 VITALS — BP 124/76 | HR 65 | Temp 98.3°F | Wt 134.4 lb

## 2018-08-10 DIAGNOSIS — F341 Dysthymic disorder: Secondary | ICD-10-CM | POA: Diagnosis not present

## 2018-08-10 DIAGNOSIS — F17200 Nicotine dependence, unspecified, uncomplicated: Secondary | ICD-10-CM | POA: Diagnosis not present

## 2018-08-10 DIAGNOSIS — F5105 Insomnia due to other mental disorder: Secondary | ICD-10-CM

## 2018-08-10 LAB — SURGICAL PATHOLOGY

## 2018-08-10 NOTE — Anesthesia Postprocedure Evaluation (Signed)
Anesthesia Post Note  Patient: Kristy Shaw  Procedure(s) Performed: DILATATION AND EVACUATION (N/A )  Patient location during evaluation: PACU Anesthesia Type: General Level of consciousness: awake and alert and oriented Pain management: pain level controlled Vital Signs Assessment: post-procedure vital signs reviewed and stable Respiratory status: spontaneous breathing Cardiovascular status: blood pressure returned to baseline Anesthetic complications: no     Last Vitals:  Vitals:   08/08/18 1333 08/08/18 1351  BP: 128/62 (!) 119/58  Pulse: 62 (!) 57  Resp: 16   Temp: 36.9 C   SpO2: 100% 100%    Last Pain:  Vitals:   08/08/18 1351  TempSrc:   PainSc: 0-No pain                 Aoki Wedemeyer

## 2018-08-10 NOTE — Progress Notes (Signed)
[name] Progress Note  08/10/2018 5:35 PM Kristy Shaw  MRN:  [id number]  Chief Complaint: ' I am here for follow up." Chief Complaint    Follow-up; Medication Refill     HPI: Kristy Shaw is a 42 year old Caucasian female who is married, lives in [place] with her husband, presented to the clinic today for a follow-up visit.  Patient today reports she lost her pregnancy since it was not viable. She had to undergo D&C. Patient reports she has been coping okay with it and has not had any significant depression or anxiety symptoms.  Patient ports she had stopped taking her medications like gabapentin when she found out that she were pregnant.  However patient continued to take the Effexor as prescribed.  Patient wonders whether she can go back on the gabapentin or not.  Patient reports sleep is good.  Patient denies any suicidality, perceptual disturbances.  Discussed with patient about possible referral for psychotherapy to discuss her recent pregnancy  and patient agrees with plan.  Visit Diagnosis:    ICD-10-CM   1. Persistent depressive disorder with mixed features, currently moderate F34.1   2. Insomnia due to mental disorder F51.05   3. Tobacco use disorder F17.200     Past Psychiatric History: I have reviewed past psychiatric history from my progress note on 02/21/2018.  Past Medical History:  Past Medical History:  Diagnosis Date  . Allergy    seasonal  . Anemia    LAST HGB 12.8 ON 01-31-17  . Anxiety   . Asthma    WELL CONTROLLED  . Breast mass, left 2017   PASH  . 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 Depression   . Dyspnea   . Dysrhythmia    IRREGULAR HEART BEAT  . Family history of adverse reaction to anesthesia    DAD-STOKE COMING OUT OF ANESTHESIA   . Fatigue   . Fibromyalgia   . GERD (gastroesophageal reflux disease)    OCC  . IBS (irritable bowel syndrome)   . Mitral valve prolapse   . Restless leg syndrome   . Sleep apnea    HAS CPAP MACHINE BUT DOES  NOT USE ON A REGULAR BASIS    Past Surgical History:  Procedure Laterality Date  . BREAST LUMPECTOMY WITH RADIOACTIVE SEED LOCALIZATION Left 06/07/2016   Procedure: LEFT BREAST LUMPECTOMY WITH RADIOACTIVE SEED LOCALIZATION;  Surgeon: [name], MD;  Location: [facility];  Service: General;  Laterality: Left;  . DILATION AND CURETTAGE OF UTERUS    . DILATION AND EVACUATION N/A 08/08/2018   Procedure: DILATATION AND EVACUATION;  Surgeon: Christeen Douglas, MD;  Location: [facility];  Service: Gynecology;  Laterality: N/A;  . HEMORRHOID SURGERY N/A 03/24/2017   Procedure: EXCISION ANORECTAL POLYP;  Surgeon: Kieth Brightly, MD;  Location: [facility];  Service: General;  Laterality: N/A;  . NASAL SEPTUM SURGERY      Family Psychiatric History: Have reviewed family psychiatric history from my progress note on 02/21/2018  Family History:  Family History  Problem Relation Age of Onset  . Hypertension Mother   . Stroke Mother   . Heart attack Father   . Stroke Father   . Depression Father   . Depression Sister   . Kidney disease Brother   . ADD / ADHD Brother   . Asthma Brother   . Breast cancer Maternal Grandmother 91    Social History: Have reviewed social history from my progress note on 02/21/2018. Social History  Socioeconomic History  . Marital status: Married    Spouse name: Not on file  . Number of children: Not on file  . Years of education: Not on file  . Highest education level: Not on file  Occupational History  . Not on file  Social Needs  . Financial resource strain: Not on file  . Food insecurity:    Worry: Not on file    Inability: Not on file  . Transportation needs:    Medical: Not on file    Non-medical: Not on file  Tobacco Use  . Smoking status: Current Every Day Smoker    Packs/day: 0.50    Years: 26.00    Pack years: 13.00    Types: Cigarettes    Start date: 09/23/1990  . Smokeless tobacco: Never Used  Substance and Sexual Activity   . Alcohol use: No    Alcohol/week: 0.0 Shaw drinks  . Drug use: Yes    Types: Marijuana    Comment: OCC  . Sexual activity: Yes    Birth control/protection: None  Lifestyle  . Physical activity:    Days per week: Not on file    Minutes per session: Not on file  . Stress: Not on file  Relationships  . Social connections:    Talks on phone: Not on file    Gets together: Not on file    Attends religious service: Not on file    Active member of club or organization: Not on file    Attends meetings of clubs or organizations: Not on file    Relationship status: Not on file  Other Topics Concern  . Not on file  Social History Narrative  . Not on file    Allergies:  Allergies  Allergen Reactions  . Adhesive [Tape] Rash  . Doxycycline Rash    Metabolic Disorder Labs: No results found for: HGBA1C, MPG No results found for: PROLACTIN No results found for: CHOL, TRIG, HDL, CHOLHDL, VLDL, LDLCALC No results found for: TSH  Therapeutic Level Labs: No results found for: LITHIUM No results found for: VALPROATE No components found for:  CBMZ  Current Medications: Current Outpatient Medications  Medication Sig Dispense Refill  . albuterol (PROAIR HFA) 108 (90 BASE) MCG/ACT inhaler Inhale into the lungs every 6 (six) hours as needed.     . cetirizine (ZYRTEC) 10 MG tablet Take 10 mg by mouth daily as needed for allergies.     Marland Kitchen diclofenac sodium (VOLTAREN) 1 % GEL Apply 2 g topically 4 (four) times daily as needed.    . fluticasone (FLONASE) 50 MCG/ACT nasal spray Place 1 spray into both nostrils daily as needed for allergies.     Marland Kitchen gabapentin (NEURONTIN) 300 MG capsule Take 1 capsule (300 mg total) by mouth 2 (two) times daily. 180 capsule 1  . meloxicam (MOBIC) 15 MG tablet Take 15 mg by mouth daily.    Marland Kitchen omeprazole (PRILOSEC) 20 MG capsule Take 20 mg by mouth daily as needed.     . ondansetron (ZOFRAN ODT) 4 MG disintegrating tablet Take 1 tablet (4 mg total) by mouth  every 6 (six) hours as needed for nausea. 20 tablet 0  . venlafaxine XR (EFFEXOR-XR) 150 MG 24 hr capsule Take 1 capsule (150 mg total) by mouth daily with breakfast. 90 capsule 1   No current facility-administered medications for this visit.      Musculoskeletal: Strength & Muscle Tone: within normal limits Gait & Station: normal Patient leans: N/A  Psychiatric Specialty  Exam: Review of Systems  Psychiatric/Behavioral: Positive for depression (improving). The patient is nervous/anxious (situational).   All other systems reviewed and are negative.   Blood pressure 124/76, pulse 65, temperature 98.3 F (36.8 C), temperature source Oral, weight 134 lb 6.4 oz (61 kg), unknown if currently breastfeeding.Body mass index is 18.74 kg/m.  General Appearance: Casual  Eye Contact:  Fair  Speech:  Normal Rate  Volume:  Normal  Mood:  Anxious  Affect:  Congruent  Thought Process:  Goal Directed and Descriptions of Associations: Intact  Orientation:  Full (Time, Place, and Person)  Thought Content: Logical   Suicidal Thoughts:  No  Homicidal Thoughts:  No  Memory:  Immediate;   Fair Recent;   Fair Remote;   Fair  Judgement:  Fair  Insight:  Fair  Psychomotor Activity:  Normal  Concentration:  Concentration: Fair and Attention Span: Fair  Recall:  [employer]  Fund of Knowledge: Fair  Language: Fair  Akathisia:  No  Handed:  Right  AIMS (if indicated): na  Assets:  Communication Skills Desire for Improvement Housing  ADL's:  Intact  Cognition: WNL  Sleep:  Fair   Screenings:   Assessment and Plan: Kristy Standardllison is a 42 year old Caucasian female who has a history of depression, OSA , relationship stressors, recent abortion, presented to the clinic today for a follow-up visit.  Patient recently had an abortion .  Patient reports she is coping okay and has not had any significant anxiety or depressive symptoms.  Discussed plan as noted below.  Plan Depression Continue Effexor XR 150 mg  p.o. Daily. Restart gabapentin 300 mg p.o. twice daily.   Insomnia She continues to use CPAP for OSA.  Reports sleep is fair.  Tobacco use disorder Provided smoking cessation counseling.  She has Chantix available.  Cannabis abuse History of occasional use. Provided substance abuse counseling.  Follow-up in clinic 1 months or sooner if needed.  More than 50 % of the time was spent for psychoeducation and supportive psychotherapy and care coordination.  This note was generated in part or whole with voice recognition software. Voice recognition is usually quite accurate but there are transcription errors that can and very often do occur. I apologize for any typographical errors that were not detected and corrected.      Jomarie LongsSaramma Mycala Warshawsky, MD 08/10/2018, 5:35 PM

## 2018-09-07 ENCOUNTER — Ambulatory Visit (INDEPENDENT_AMBULATORY_CARE_PROVIDER_SITE_OTHER): Payer: 59 | Admitting: Licensed Clinical Social Worker

## 2018-09-07 ENCOUNTER — Ambulatory Visit (INDEPENDENT_AMBULATORY_CARE_PROVIDER_SITE_OTHER): Payer: 59 | Admitting: Psychiatry

## 2018-09-07 ENCOUNTER — Encounter: Payer: Self-pay | Admitting: Psychiatry

## 2018-09-07 ENCOUNTER — Other Ambulatory Visit: Payer: Self-pay

## 2018-09-07 VITALS — BP 125/83 | HR 67 | Temp 98.2°F | Wt 131.6 lb

## 2018-09-07 DIAGNOSIS — G4733 Obstructive sleep apnea (adult) (pediatric): Secondary | ICD-10-CM | POA: Insufficient documentation

## 2018-09-07 DIAGNOSIS — F5105 Insomnia due to other mental disorder: Secondary | ICD-10-CM

## 2018-09-07 DIAGNOSIS — F341 Dysthymic disorder: Secondary | ICD-10-CM

## 2018-09-07 DIAGNOSIS — F17200 Nicotine dependence, unspecified, uncomplicated: Secondary | ICD-10-CM

## 2018-09-07 DIAGNOSIS — Z9989 Dependence on other enabling machines and devices: Secondary | ICD-10-CM

## 2018-09-07 MED ORDER — VENLAFAXINE HCL ER 37.5 MG PO CP24: 37.5000 mg | ORAL_CAPSULE | Freq: Every day | ORAL | 0 refills | Status: DC

## 2018-09-07 NOTE — Progress Notes (Signed)
[name] Progress Note  09/07/2018 4:31 PM Kristy Shaw  MRN:  [id number]  Chief Complaint: ' I am here for follow up." Chief Complaint    Follow-up; Medication Refill     HPI: Kristy Shaw is a 42 year old Caucasian female who is married, lives in [place] with her husband, presented to the clinic today for a follow-up visit.  Patient has a history of dysthymia and insomnia.  Patient reports she continues to struggle with depressive symptoms.  She reports most of the time she feels overwhelmed with all the chores that she has to do around the house.  She reports her husband has been helping out a lot.  He has been taking care of the 107-year-old and also helping with meals.  She reports she does not want to keep doing that and wants to contribute.  She also wants to return to work.  She reports she has a degree in criminal justice and wants to return to do something in that field.  Patient however does not know where to start since it has been a long time since she graduated.  Patient reports she has not been compliant with her CPAP.  She keeps procrastinating and is unable to start using it again.  She feels guilty about it and is aware about the complications of not using it properly.  Patient continues to want to quit smoking however has not been able to use the Chantix yet.  She reports she is motivated to do it and will start using it soon.  She reports she wants to restart psychotherapy sessions with Joni Reining and she feels she needs some guidance and support.  Discussed changing her Effexor dosage today to address her depressive symptoms and she agreed with plan.  Patient denies any suicidality or homicidality.    Patient has good social support from her family and a friend. Visit Diagnosis:    ICD-10-CM   1. Persistent depressive disorder with mixed features, currently moderate F34.1   2. Insomnia due to mental disorder F51.05   3. Tobacco use disorder F17.200     Past Psychiatric  History: I have reviewed past psychiatric history from my progress note on 02/21/2018.  Past Medical History:  Past Medical History:  Diagnosis Date  . Allergy    seasonal  . Anemia    LAST HGB 12.8 ON 01-31-17  . Anxiety   . Asthma    WELL CONTROLLED  . Breast mass, left 2017   PASH  . 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 Depression   . Dyspnea   . Dysrhythmia    IRREGULAR HEART BEAT  . Family history of adverse reaction to anesthesia    DAD-STOKE COMING OUT OF ANESTHESIA   . Fatigue   . Fibromyalgia   . GERD (gastroesophageal reflux disease)    OCC  . IBS (irritable bowel syndrome)   . Mitral valve prolapse   . Restless leg syndrome   . Sleep apnea    HAS CPAP MACHINE BUT DOES NOT USE ON A REGULAR BASIS    Past Surgical History:  Procedure Laterality Date  . BREAST LUMPECTOMY WITH RADIOACTIVE SEED LOCALIZATION Left 06/07/2016   Procedure: LEFT BREAST LUMPECTOMY WITH RADIOACTIVE SEED LOCALIZATION;  Surgeon: [name], MD;  Location: [facility];  Service: General;  Laterality: Left;  . DILATION AND CURETTAGE OF UTERUS    . DILATION AND EVACUATION N/A 08/08/2018   Procedure: DILATATION AND EVACUATION;  Surgeon: Christeen Douglas, MD;  Location: [facility];  Service: Gynecology;  Laterality: N/A;  . HEMORRHOID SURGERY N/A 03/24/2017   Procedure: EXCISION ANORECTAL POLYP;  Surgeon: Kieth Brightly, MD;  Location: [facility];  Service: General;  Laterality: N/A;  . NASAL SEPTUM SURGERY      Family Psychiatric History: Reviewed family psychiatric history from my progress note on 02/21/2018  Family History:  Family History  Problem Relation Age of Onset  . Hypertension Mother   . Stroke Mother   . Heart attack Father   . Stroke Father   . Depression Father   . Depression Sister   . Kidney disease Brother   . ADD / ADHD Brother   . Asthma Brother   . Breast cancer Maternal Grandmother 55    Social History: I have reviewed social history from my progress  note on 02/21/2018. Social History   Socioeconomic History  . Marital status: Married    Spouse name: Not on file  . Number of children: Not on file  . Years of education: Not on file  . Highest education level: Not on file  Occupational History  . Not on file  Social Needs  . Financial resource strain: Not on file  . Food insecurity:    Worry: Not on file    Inability: Not on file  . Transportation needs:    Medical: Not on file    Non-medical: Not on file  Tobacco Use  . Smoking status: Current Every Day Smoker    Packs/day: 0.50    Years: 26.00    Pack years: 13.00    Types: Cigarettes    Start date: 09/23/1990  . Smokeless tobacco: Never Used  Substance and Sexual Activity  . Alcohol use: No    Alcohol/week: 0.0 Shaw drinks  . Drug use: Yes    Types: Marijuana    Comment: OCC  . Sexual activity: Yes    Birth control/protection: None  Lifestyle  . Physical activity:    Days per week: Not on file    Minutes per session: Not on file  . Stress: Not on file  Relationships  . Social connections:    Talks on phone: Not on file    Gets together: Not on file    Attends religious service: Not on file    Active member of club or organization: Not on file    Attends meetings of clubs or organizations: Not on file    Relationship status: Not on file  Other Topics Concern  . Not on file  Social History Narrative  . Not on file    Allergies:  Allergies  Allergen Reactions  . Adhesive [Tape] Rash  . Doxycycline Rash    Metabolic Disorder Labs: No results found for: HGBA1C, MPG No results found for: PROLACTIN No results found for: CHOL, TRIG, HDL, CHOLHDL, VLDL, LDLCALC No results found for: TSH  Therapeutic Level Labs: No results found for: LITHIUM No results found for: VALPROATE No components found for:  CBMZ  Current Medications: Current Outpatient Medications  Medication Sig Dispense Refill  . albuterol (PROAIR HFA) 108 (90 BASE) MCG/ACT inhaler  Inhale into the lungs every 6 (six) hours as needed.     . cetirizine (ZYRTEC) 10 MG tablet Take 10 mg by mouth daily as needed for allergies.     Marland Kitchen diclofenac sodium (VOLTAREN) 1 % GEL Apply 2 g topically 4 (four) times daily as needed.    . fluticasone (FLONASE) 50 MCG/ACT nasal spray Place 1 spray into both  nostrils daily as needed for allergies.     Marland Kitchen. gabapentin (NEURONTIN) 300 MG capsule Take 1 capsule (300 mg total) by mouth 2 (two) times daily. 180 capsule 1  . meloxicam (MOBIC) 15 MG tablet Take 15 mg by mouth daily.    Marland Kitchen. omeprazole (PRILOSEC) 20 MG capsule Take 20 mg by mouth daily as needed.     . ondansetron (ZOFRAN ODT) 4 MG disintegrating tablet Take 1 tablet (4 mg total) by mouth every 6 (six) hours as needed for nausea. 20 tablet 0  . venlafaxine XR (EFFEXOR-XR) 150 MG 24 hr capsule Take 1 capsule (150 mg total) by mouth daily with breakfast. 90 capsule 1  . venlafaxine XR (EFFEXOR-XR) 37.5 MG 24 hr capsule Take 1 capsule (37.5 mg total) by mouth daily with breakfast. To be added with 150 mg 90 capsule 0   No current facility-administered medications for this visit.      Musculoskeletal: Strength & Muscle Tone: within normal limits Gait & Station: normal Patient leans: N/A  Psychiatric Specialty Exam: Review of Systems  Psychiatric/Behavioral: Positive for depression. The patient is nervous/anxious and has insomnia.   All other systems reviewed and are negative.   Blood pressure 125/83, pulse 67, temperature 98.2 F (36.8 C), temperature source Oral, weight 131 lb 9.6 oz (59.7 kg), unknown if currently breastfeeding.Body mass index is 18.35 kg/m.  General Appearance: Casual  Eye Contact:  Fair  Speech:  Clear and Coherent  Volume:  Normal  Mood:  Anxious and Dysphoric  Affect:  Congruent  Thought Process:  Goal Directed and Descriptions of Associations: Intact  Orientation:  Full (Time, Place, and Person)  Thought Content: Logical   Suicidal Thoughts:  No   Homicidal Thoughts:  No  Memory:  Immediate;   Fair Recent;   Fair Remote;   Fair  Judgement:  Fair  Insight:  Fair  Psychomotor Activity:  Normal  Concentration:  Concentration: Fair and Attention Span: Fair  Recall:  [employer]  Fund of Knowledge: Fair  Language: Fair  Akathisia:  No  Handed:  Right  AIMS (if indicated): na  Assets:  Communication Skills Desire for Improvement Social Support  ADL's:  Intact  Cognition: WNL  Sleep:  restless   Screenings:   Assessment and Plan: Kristy Shaw is a 42 year old Caucasian female who has a history of depression, OSA, relationship stressors, recent abortion, presented to the clinic today for a follow-up visit.  Patient continues to struggle with some mood symptoms as well as sleep issues.  Patient is motivated to start psychotherapy sessions again.  We will also make the following medication changes.  Plan For depression Increase Effexor XR to 187.5 mg p.o. daily Continue gabapentin 300 mg p.o. twice daily Restart psychotherapy sessions with our therapist Joni Reiningicole.  For insomnia Patient has obstructive sleep apnea, noncompliant with CPAP.  She reports she is motivated to restart using her CPAP again.  Tobacco use disorder Patient reports she is motivated to start Chantix.  Discussed the interaction of Protonix with Chantix.  Follow-up in clinic in 1 month or sooner if needed.  More than 50 % of the time was spent for psychoeducation and supportive psychotherapy and care coordination.  This note was generated in part or whole with voice recognition software. Voice recognition is usually quite accurate but there are transcription errors that can and very often do occur. I apologize for any typographical errors that were not detected and corrected.         Jomarie LongsSaramma Kniyah Khun, MD 09/07/2018,  4:31 PM

## 2018-09-12 ENCOUNTER — Telehealth: Payer: Self-pay

## 2018-09-12 NOTE — Telephone Encounter (Signed)
prior auth is pending review.  

## 2018-09-12 NOTE — Telephone Encounter (Signed)
received a faxed prior auth is needed for the chantix..5mg 

## 2018-09-13 ENCOUNTER — Telehealth: Payer: Self-pay | Admitting: Psychiatry

## 2018-09-13 NOTE — Telephone Encounter (Signed)
Returned call to patient regarding chantix denied by her plan. Left voicemail to call back.

## 2018-09-13 NOTE — Telephone Encounter (Signed)
chantix .5mg  is denied. pt needs to try and fail bupropion (zyban)

## 2018-09-13 NOTE — Telephone Encounter (Signed)
Ok thanks  Will discuss with patient.

## 2018-09-20 ENCOUNTER — Ambulatory Visit (INDEPENDENT_AMBULATORY_CARE_PROVIDER_SITE_OTHER): Payer: 59 | Admitting: Licensed Clinical Social Worker

## 2018-09-20 DIAGNOSIS — F341 Dysthymic disorder: Secondary | ICD-10-CM | POA: Diagnosis not present

## 2018-09-26 NOTE — Progress Notes (Signed)
   THERAPIST PROGRESS NOTE  Session Time:  Participation Level: Active  Behavioral Response: CasualAlertIrritable  Type of Therapy: Individual Therapy  Treatment Goals addressed: Coping  Interventions: CBT  Summary: Kristy Shaw is a 42 y.o. female who presents with continued symptoms of diagnosis.  Reviewed with Patient her diagnosis and symptoms since previous session was over 1 year ago.  Normalized feelings and allowed Patient to vent her frustration about her relationship with her husband and her in laws.  Discovered Patient has no outlet or no daily routine.  Established that Patient will talk with friends that are supportive and start her day in the morning and have set task/activities to accomplish.   Suicidal/Homicidal: No  Plan: Return again in 2 weeks.  Diagnosis: Axis I: Persistent Depressive Disorder    Axis II: No diagnosis    Marinda Elk, LCSW 09/07/2018

## 2018-09-27 ENCOUNTER — Ambulatory Visit (INDEPENDENT_AMBULATORY_CARE_PROVIDER_SITE_OTHER): Payer: 59 | Admitting: Licensed Clinical Social Worker

## 2018-09-27 DIAGNOSIS — F341 Dysthymic disorder: Secondary | ICD-10-CM | POA: Diagnosis not present

## 2018-10-06 ENCOUNTER — Encounter: Payer: Self-pay | Admitting: Psychiatry

## 2018-10-06 ENCOUNTER — Ambulatory Visit (INDEPENDENT_AMBULATORY_CARE_PROVIDER_SITE_OTHER): Payer: 59 | Admitting: Psychiatry

## 2018-10-06 VITALS — BP 112/76 | HR 86 | Ht 69.0 in | Wt 132.0 lb

## 2018-10-06 DIAGNOSIS — F17200 Nicotine dependence, unspecified, uncomplicated: Secondary | ICD-10-CM

## 2018-10-06 DIAGNOSIS — F341 Dysthymic disorder: Secondary | ICD-10-CM | POA: Diagnosis not present

## 2018-10-06 DIAGNOSIS — F5105 Insomnia due to other mental disorder: Secondary | ICD-10-CM

## 2018-10-06 MED ORDER — VARENICLINE TARTRATE 0.5 MG PO TABS: 0.5000 mg | ORAL_TABLET | Freq: Two times a day (BID) | ORAL | 2 refills | Status: DC

## 2018-10-06 NOTE — Progress Notes (Signed)
[name] Progress Note  10/06/2018 12:54 PM Kristy Shaw  MRN:  [id number]  Chief Complaint: ' I am here for follow up visit.' Chief Complaint    Follow-up     HPI: Kristy Shaw is a 42 yr old Caucasian female who is married, lives in [place], presented to the clinic today for a follow-up visit.  Patient has a history of dysthymia and insomnia.  She today reports that she continues to be improving on the current medication regimen.  She  is compliant with her Effexor as prescribed.  She however reports she has noticed some side effects of excessive sweatiness during the night.  She reports it could be due to the Effexor.  She however reports she wants to give it more time.  She does not want to make any changes with her medications yet.  She reports sleep is good.  She reports she continues to want to work on smoking cessation.  She however reports she could not get the Chantix approved by her health insurance plan.  She reports they wanted her to be tried on bupropion before trying the Chantix.  She reports she has tried bupropion in the past and it did not help her.  She reports she had side effects to the bupropion.  She reports she hence wants to try the Chantix if it is affordable for her.  She is very motivated to stop smoking.  Discussed with patient that we can send another prescription for Chantix  today.  She denies any suicidality.  Patient denies any perceptual disturbances.  Patient continues to have good social support system from her husband.  She is also in psychotherapy with Ms. Peacock.   Visit Diagnosis:    ICD-10-CM   1. Persistent depressive disorder with mixed features, currently moderate F34.1 varenicline (CHANTIX) 0.5 MG tablet  2. Insomnia due to mental disorder F51.05   3. Tobacco use disorder F17.200     Past Psychiatric History: I have reviewed past psychiatric history from my progress note on 02/21/2018  Past Medical History:  Past Medical History:   Diagnosis Date  . Allergy    seasonal  . Anemia    LAST HGB 12.8 ON 01-31-17  . Anxiety   . Asthma    WELL CONTROLLED  . Breast mass, left 2017   PASH  . 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 Depression   . Dyspnea   . Dysrhythmia    IRREGULAR HEART BEAT  . Family history of adverse reaction to anesthesia    DAD-STOKE COMING OUT OF ANESTHESIA   . Fatigue   . Fibromyalgia   . GERD (gastroesophageal reflux disease)    OCC  . IBS (irritable bowel syndrome)   . Mitral valve prolapse   . Restless leg syndrome   . Sleep apnea    HAS CPAP MACHINE BUT DOES NOT USE ON A REGULAR BASIS    Past Surgical History:  Procedure Laterality Date  . BREAST LUMPECTOMY WITH RADIOACTIVE SEED LOCALIZATION Left 06/07/2016   Procedure: LEFT BREAST LUMPECTOMY WITH RADIOACTIVE SEED LOCALIZATION;  Surgeon: [name], MD;  Location: [facility];  Service: General;  Laterality: Left;  . DILATION AND CURETTAGE OF UTERUS    . DILATION AND EVACUATION N/A 08/08/2018   Procedure: DILATATION AND EVACUATION;  Surgeon: Christeen Douglas, MD;  Location: [facility];  Service: Gynecology;  Laterality: N/A;  . HEMORRHOID SURGERY N/A 03/24/2017   Procedure: EXCISION ANORECTAL POLYP;  Surgeon: Kieth Brightly,  MD;  Location: [facility];  Service: General;  Laterality: N/A;  . NASAL SEPTUM SURGERY      Family Psychiatric History: Reviewed family psychiatric history from my progress note on 02/21/2018  Family History:  Family History  Problem Relation Age of Onset  . Hypertension Mother   . Stroke Mother   . Heart attack Father   . Stroke Father   . Depression Father   . Depression Sister   . Kidney disease Brother   . ADD / ADHD Brother   . Asthma Brother   . Breast cancer Maternal Grandmother 88    Social History: Have reviewed social history from my progress note on 02/21/2018 Social History   Socioeconomic History  . Marital status: Married    Spouse name: Not on file  . Number of  children: Not on file  . Years of education: Not on file  . Highest education level: Not on file  Occupational History  . Not on file  Social Needs  . Financial resource strain: Not on file  . Food insecurity:    Worry: Not on file    Inability: Not on file  . Transportation needs:    Medical: Not on file    Non-medical: Not on file  Tobacco Use  . Smoking status: Current Every Day Smoker    Packs/day: 0.50    Years: 26.00    Pack years: 13.00    Types: Cigarettes    Start date: 09/23/1990  . Smokeless tobacco: Never Used  Substance and Sexual Activity  . Alcohol use: No    Alcohol/week: 0.0 Shaw drinks  . Drug use: Yes    Types: Marijuana    Comment: OCC  . Sexual activity: Yes    Birth control/protection: None  Lifestyle  . Physical activity:    Days per week: Not on file    Minutes per session: Not on file  . Stress: Not on file  Relationships  . Social connections:    Talks on phone: Not on file    Gets together: Not on file    Attends religious service: Not on file    Active member of club or organization: Not on file    Attends meetings of clubs or organizations: Not on file    Relationship status: Not on file  Other Topics Concern  . Not on file  Social History Narrative  . Not on file    Allergies:  Allergies  Allergen Reactions  . Adhesive [Tape] Rash  . Doxycycline Rash    Metabolic Disorder Labs: No results found for: HGBA1C, MPG No results found for: PROLACTIN No results found for: CHOL, TRIG, HDL, CHOLHDL, VLDL, LDLCALC No results found for: TSH  Therapeutic Level Labs: No results found for: LITHIUM No results found for: VALPROATE No components found for:  CBMZ  Current Medications: Current Outpatient Medications  Medication Sig Dispense Refill  . albuterol (PROAIR HFA) 108 (90 BASE) MCG/ACT inhaler Inhale into the lungs every 6 (six) hours as needed.     . cetirizine (ZYRTEC) 10 MG tablet Take 10 mg by mouth daily as needed for  allergies.     Marland Kitchen diclofenac sodium (VOLTAREN) 1 % GEL Apply 2 g topically 4 (four) times daily as needed.    . fluticasone (FLONASE) 50 MCG/ACT nasal spray Place 1 spray into both nostrils daily as needed for allergies.     Marland Kitchen gabapentin (NEURONTIN) 300 MG capsule Take 1 capsule (300 mg total) by mouth 2 (two) times  daily. 180 capsule 1  . meloxicam (MOBIC) 15 MG tablet Take 15 mg by mouth daily.    Marland Kitchen omeprazole (PRILOSEC) 20 MG capsule Take 20 mg by mouth daily as needed.     . ondansetron (ZOFRAN ODT) 4 MG disintegrating tablet Take 1 tablet (4 mg total) by mouth every 6 (six) hours as needed for nausea. 20 tablet 0  . pantoprazole (PROTONIX) 40 MG tablet Take 40 mg by mouth daily.  2  . varenicline (CHANTIX) 0.5 MG tablet Take 1 tablet (0.5 mg total) by mouth 2 (two) times daily. 60 tablet 2  . venlafaxine XR (EFFEXOR-XR) 150 MG 24 hr capsule Take 1 capsule (150 mg total) by mouth daily with breakfast. 90 capsule 1  . venlafaxine XR (EFFEXOR-XR) 37.5 MG 24 hr capsule Take 1 capsule (37.5 mg total) by mouth daily with breakfast. To be added with 150 mg 90 capsule 0   No current facility-administered medications for this visit.      Musculoskeletal: Strength & Muscle Tone: within normal limits Gait & Station: normal Patient leans: N/A  Psychiatric Specialty Exam: Review of Systems  Psychiatric/Behavioral: The patient is nervous/anxious (improving).   All other systems reviewed and are negative.   Blood pressure 112/76, pulse 86, height 5\' 9"  (1.753 m), weight 132 lb (59.9 kg), SpO2 94 %, not currently breastfeeding.Body mass index is 19.49 kg/m.  General Appearance: Casual  Eye Contact:  Fair  Speech:  Clear and Coherent  Volume:  Normal  Mood:  Anxious and Dysphoric improving  Affect:  Congruent  Thought Process:  Goal Directed and Descriptions of Associations: Intact  Orientation:  Full (Time, Place, and Person)  Thought Content: Logical   Suicidal Thoughts:  No  Homicidal  Thoughts:  No  Memory:  Immediate;   Fair Recent;   Fair Remote;   Fair  Judgement:  Fair  Insight:  Good  Psychomotor Activity:  Normal  Concentration:  Concentration: Fair and Attention Span: Fair  Recall:  [employer] of Knowledge: Fair  Language: Fair  Akathisia:  No  Handed:  Right  AIMS (if indicated): na  Assets:  Communication Skills Desire for Improvement Social Support  ADL's:  Intact  Cognition: WNL  Sleep:  Fair has some excessive sweatiness   Screenings:   Assessment and Plan: Kristy Shaw is a 42 year old Caucasian female who has a history of depression, OSA, relationship stressors, recent abortion, presented to the clinic today for a follow-up visit.  She is compliant with her medication however does have some side effects to the Effexor.  She however wants to give it more time.  She is motivated to stop smoking.  Discussed the following medication changes with patient.  She will continue psychotherapy sessions.  Plan For depression Effexor XR 187.5 mg p.o. daily Gabapentin 300 mg p.o. twice daily Continue psychotherapy with Ms. Peacock.  Insomnia She has OSA, he is trying to be compliant with CPAP.  For tobacco use disorder Patient reports she has tried bupropion in the past and had side effects-did not help. Hence will start Chantix 0.5 mg p.o. twice daily.  Follow-up in clinic in 4 weeks or sooner if needed.  More than 50 % of the time was spent for psychoeducation and supportive psychotherapy and care coordination.  This note was generated in part or whole with voice recognition software. Voice recognition is usually quite accurate but there are transcription errors that can and very often do occur. I apologize for any typographical errors that were not  detected and corrected.       [name], MD 10/06/2018, 12:54 PM

## 2018-10-09 ENCOUNTER — Telehealth: Payer: Self-pay

## 2018-10-09 NOTE — Telephone Encounter (Signed)
went online to [email or web address] and started the prior authorization for the chantix for the patient.  - pending review.

## 2018-10-09 NOTE — Telephone Encounter (Signed)
thanks

## 2018-10-09 NOTE — Telephone Encounter (Signed)
received a fax requesting a prior auth on chantix .

## 2018-10-10 NOTE — Telephone Encounter (Signed)
pharmacy faxed and confirmed approval notice  

## 2018-10-10 NOTE — Telephone Encounter (Signed)
chantix .5mg  was approved for 93 day syookt ub 365 days through 10-10-2019

## 2018-10-10 NOTE — Telephone Encounter (Signed)
thanks

## 2018-10-11 ENCOUNTER — Ambulatory Visit (INDEPENDENT_AMBULATORY_CARE_PROVIDER_SITE_OTHER): Payer: 59 | Admitting: Licensed Clinical Social Worker

## 2018-10-11 DIAGNOSIS — F341 Dysthymic disorder: Secondary | ICD-10-CM

## 2018-10-16 NOTE — Progress Notes (Signed)
   THERAPIST PROGRESS NOTE  Session Time: 60 min  Participation Level: Active  Behavioral Response: CasualAlertDepressed  Type of Therapy: Individual Therapy  Treatment Goals addressed: Coping  Interventions: CBT and Motivational Interviewing  Summary: Kristy Shaw is a 42 y.o. female who presents with continued symptoms of diagnosis.  Patient reports mood as "angry".  Discussion of mood and why she feels angry.  Discussion of how to release feelings instead of letting them fester.  Stressed importance of small steps and reitierated moving forward at any pace is better than not moving at all.  Reviewed CBT skills to reduce depression and anger.   Suicidal/Homicidal: No  Plan: Return again in 2 weeks.  Diagnosis: Axis I: Dysthymic Disorder    Axis II: No diagnosis    Marinda Elk, LCSW 09/20/2018

## 2018-10-17 NOTE — Progress Notes (Signed)
   THERAPIST PROGRESS NOTE  Session Time:  Participation Level: Active  Behavioral Response: CasualAlertEuthymic  Type of Therapy: Individual Therapy  Treatment Goals addressed: Coping  Interventions: CBT and Motivational Interviewing  Summary: Kristy Shaw is a 42 y.o. female who presents with continued symptoms of diagnosis.   Provided support for Patient as she discussed her current mood. Stressed the importance of mood stabilization through learned coping skills and medication management.  Encouraged Patient to focus on her strengths and the positive aspects.  Suicidal/Homicidal: No  Plan: Return again in 2 weeks.  Diagnosis: Axis I: Dysthymic Disorder    Axis II: No diagnosis    Marinda Elk, LCSW 10/11/2018

## 2018-10-20 ENCOUNTER — Ambulatory Visit: Payer: 59 | Admitting: Psychiatry

## 2018-10-23 ENCOUNTER — Ambulatory Visit: Payer: 59 | Admitting: Psychiatry

## 2018-10-24 NOTE — Progress Notes (Signed)
   THERAPIST PROGRESS NOTE  Session Time: 67mn  Participation Level: Active  Behavioral Response: CasualAlertEuthymic  Type of Therapy: Individual Therapy  Treatment Goals addressed: Coping and Diagnosis: Dysthymic  Interventions: CBT and Motivational Interviewing  Summary: Kristy BARTLINGis a 42y.o. female who presents with continued symptoms of her diagnosis.  Therapist met with Patient in an outpatient setting to assess current mood and assist with making progress towards goals through the use of therapeutic intervention. Therapist did a brief mood check, assessing anger, fear, disgust, excitement, happiness, and sadness.  Patient vented her frustration regarding her triggers.  Therapist educated Patient on her diagnosis and discussed treatment options. Discussion of IOP and PHP programs.  Therapist taught grounding techniques and modeled how to use.    Suicidal/Homicidal: No  Plan: Return again in 2 weeks.  Diagnosis: Axis I: Dysthymic Disorder    Axis II: No diagnosis    NLubertha South LCSW 09/27/2018

## 2018-10-25 ENCOUNTER — Ambulatory Visit (INDEPENDENT_AMBULATORY_CARE_PROVIDER_SITE_OTHER): Payer: 59 | Admitting: Psychiatry

## 2018-10-25 ENCOUNTER — Encounter: Payer: Self-pay | Admitting: Psychiatry

## 2018-10-25 ENCOUNTER — Ambulatory Visit (INDEPENDENT_AMBULATORY_CARE_PROVIDER_SITE_OTHER): Payer: 59 | Admitting: Licensed Clinical Social Worker

## 2018-10-25 VITALS — BP 118/62 | Temp 98.1°F | Wt 131.8 lb

## 2018-10-25 DIAGNOSIS — F341 Dysthymic disorder: Secondary | ICD-10-CM | POA: Diagnosis not present

## 2018-10-25 DIAGNOSIS — F17200 Nicotine dependence, unspecified, uncomplicated: Secondary | ICD-10-CM

## 2018-10-25 DIAGNOSIS — F5105 Insomnia due to other mental disorder: Secondary | ICD-10-CM | POA: Diagnosis not present

## 2018-10-25 MED ORDER — GABAPENTIN 600 MG PO TABS: 300.0000 mg | ORAL_TABLET | ORAL | 0 refills | Status: DC

## 2018-10-25 MED ORDER — MELATONIN 3 MG PO CAPS: 3.0000 mg | ORAL_CAPSULE | Freq: Every day | ORAL | 0 refills | Status: DC
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
## 2018-10-26 ENCOUNTER — Encounter: Payer: Self-pay | Admitting: Psychiatry

## 2018-10-27 NOTE — Progress Notes (Signed)
   THERAPIST PROGRESS NOTE  Session Time: 45mn  Participation Level: Active  Behavioral Response: CasualAlertDepressed  Type of Therapy: Individual Therapy  Treatment Goals addressed: Coping  Interventions: CBT and Motivational Interviewing  Summary: Kristy CLAASSENis a 42y.o. female who presents with continued symptoms of her diagnosis.  Therapist met with Patient in an outpatient setting to assess current mood and assist with making progress towards goals through the use of therapeutic intervention. Therapist did a brief mood check, assessing anger, fear, disgust, excitement, happiness, and sadness.  Patient reports "sadness" due to her being overwhelmed.  Patient reports extreme bouts of stress due to her sister verbalizing that she wants to harm herself.  Patient was able to recall events that led to her feelings stressed.  Therapist normalized feelings and encouraged Patient to use coping skills and cognitive restructuring to reduce symptoms.  Patient recalled her childhood that led to her inability to show emotions besides anger.  Therapist normalized feelings and encouraged Patient to journal    Suicidal/Homicidal: No  Plan: Return again in 2 weeks.  Diagnosis: Axis I: Dysthymic Disorder    Axis II: No diagnosis    NLubertha South LCSW 10/25/2018

## 2018-11-08 ENCOUNTER — Ambulatory Visit (INDEPENDENT_AMBULATORY_CARE_PROVIDER_SITE_OTHER): Payer: 59 | Admitting: Psychiatry

## 2018-11-08 ENCOUNTER — Encounter: Payer: Self-pay | Admitting: Psychiatry

## 2018-11-08 ENCOUNTER — Other Ambulatory Visit: Payer: Self-pay

## 2018-11-08 VITALS — BP 122/82 | HR 105 | Temp 98.4°F | Wt 133.8 lb

## 2018-11-08 DIAGNOSIS — F17200 Nicotine dependence, unspecified, uncomplicated: Secondary | ICD-10-CM | POA: Diagnosis not present

## 2018-11-08 DIAGNOSIS — F331 Major depressive disorder, recurrent, moderate: Secondary | ICD-10-CM

## 2018-11-08 DIAGNOSIS — F5105 Insomnia due to other mental disorder: Secondary | ICD-10-CM | POA: Diagnosis not present

## 2018-11-08 MED ORDER — ARIPIPRAZOLE 2 MG PO TABS: 2.0000 mg | ORAL_TABLET | Freq: Every day | ORAL | 0 refills | Status: DC

## 2018-11-08 MED ORDER — HYDROXYZINE PAMOATE 25 MG PO CAPS: 25.0000 mg | ORAL_CAPSULE | Freq: Every day | ORAL | 1 refills | Status: DC | PRN

## 2018-11-08 NOTE — Patient Instructions (Signed)
Aripiprazole tablets What is this medicine? ARIPIPRAZOLE (ay ri PIP ray zole) is an atypical antipsychotic. It is used to treat schizophrenia and bipolar disorder, also known as manic-depression. It is also used to treat Tourette's disorder and some symptoms of autism. This medicine may also be used in combination with antidepressants to treat major depressive disorder. This medicine may be used for other purposes; ask your health care provider or pharmacist if you have questions. COMMON BRAND NAME(S): Abilify What should I tell my health care provider before I take this medicine? They need to know if you have any of these conditions: -dehydration -dementia -diabetes -heart disease -history of stroke -low blood counts, like low white cell, platelet, or red cell counts -Parkinson's disease -seizures -suicidal thoughts, plans, or attempt; a previous suicide attempt by you or a family member -an unusual or allergic reaction to aripiprazole, other medicines, foods, dyes, or preservatives -pregnant or trying to get pregnant -breast-feeding How should I use this medicine? Take this medicine by mouth with a glass of water. Follow the directions on the prescription label. You can take this medicine with or without food. Take your doses at regular intervals. Do not take your medicine more often than directed. Do not stop taking except on the advice of your doctor or health care professional. A special MedGuide will be given to you by the pharmacist with each prescription and refill. Be sure to read this information carefully each time. Talk to your pediatrician regarding the use of this medicine in children. While this drug may be prescribed for children as young as 6 years of age for selected conditions, precautions do apply. Overdosage: If you think you have taken too much of this medicine contact a poison control center or emergency room at once. NOTE: This medicine is only for you. Do not share  this medicine with others. What if I miss a dose? If you miss a dose, take it as soon as you can. If it is almost time for your next dose, take only that dose. Do not take double or extra doses. What may interact with this medicine? Do not take this medicine with any of the following medications: -brexpiprazole -cisapride -dofetilide -dronedarone -metoclopramide -pimozide -thioridazine This medicine may also interact with the following medications: -alcohol -carbamazepine -certain medicines for anxiety or sleep -certain medicines for blood pressure -certain medicines for fungal infections like ketoconazole, fluconazole, posaconazole, and itraconazole -clarithromycin -fluoxetine -other medicines that prolong the QT interval (cause an abnormal heart rhythm) -paroxetine -quinidine -rifampin This list may not describe all possible interactions. Give your health care provider a list of all the medicines, herbs, non-prescription drugs, or dietary supplements you use. Also tell them if you smoke, drink alcohol, or use illegal drugs. Some items may interact with your medicine. What should I watch for while using this medicine? Visit your doctor or health care professional for regular checks on your progress. It may be several weeks before you see the full effects of this medicine. Do not suddenly stop taking this medicine. You may need to gradually reduce the dose. Patients and their families should watch out for worsening depression or thoughts of suicide. Also watch out for sudden changes in feelings such as feeling anxious, agitated, panicky, irritable, hostile, aggressive, impulsive, severely restless, overly excited and hyperactive, or not being able to sleep. If this happens, especially at the beginning of antidepressant treatment or after a change in dose, call your health care professional. You may get dizzy or drowsy. Do   not drive, use machinery, or do anything that needs mental  alertness until you know how this medicine affects you. Do not stand or sit up quickly, especially if you are an older patient. This reduces the risk of dizzy or fainting spells. Alcohol can increase dizziness and drowsiness. Avoid alcoholic drinks. This medicine can reduce the response of your body to heat or cold. Dress warm in cold weather and stay hydrated in hot weather. If possible, avoid extreme temperatures like saunas, hot tubs, very hot or cold showers, or activities that can cause dehydration such as vigorous exercise. This medicine may cause dry eyes and blurred vision. If you wear contact lenses you may feel some discomfort. Lubricating drops may help. See your eye doctor if the problem does not go away or is severe. If you notice an increased hunger or thirst, different from your normal hunger or thirst, or if you find that you have to urinate more frequently, you should contact your health care provider as soon as possible. You may need to have your blood sugar monitored. This medicine may cause changes in your blood sugar levels. You should monitor you blood sugar frequently if you have diabetes. There have been reports of uncontrollable and strong urges to gamble, binge eat, shop, and have sex while taking this medicine. If you experience any of these or other uncontrollable and strong urges while taking this medicine, you should report it to your health care provider as soon as possible. What side effects may I notice from receiving this medicine? Side effects that you should report to your doctor or health care professional as soon as possible: -allergic reactions like skin rash, itching or hives, swelling of the face, lips, or tongue -breathing problems -confusion -feeling faint or lightheaded, falls -fever or chills, sore throat -increased hunger or thirst -increased urination -joint pain -muscles pain, spasms -problems with balance, talking, walking -restlessness or need to  keep moving -seizures -suicidal thoughts or other mood changes -trouble swallowing -uncontrollable and excessive urges (examples: gambling, binge eating, shopping, having sex) -uncontrollable head, mouth, neck, arm, or leg movements -unusually weak or tired Side effects that usually do not require medical attention (report to your doctor or health care professional if they continue or are bothersome): -blurred vision -constipation -headache -nausea, vomiting -trouble sleeping -weight gain This list may not describe all possible side effects. Call your doctor for medical advice about side effects. You may report side effects to FDA at 1-800-FDA-1088. Where should I keep my medicine? Keep out of the reach of children. Store at room temperature between 15 and 30 degrees C (59 and 86 degrees F). Throw away any unused medicine after the expiration date. NOTE: This sheet is a summary. It may not cover all possible information. If you have questions about this medicine, talk to your doctor, pharmacist, or health care provider.  2018 [employer]vier/[employer] Standard (2016-11-21 11:45:05)  
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
## 2018-11-13 ENCOUNTER — Encounter: Payer: Self-pay | Admitting: Psychiatry

## 2018-11-13 ENCOUNTER — Encounter

## 2018-11-13 ENCOUNTER — Ambulatory Visit (INDEPENDENT_AMBULATORY_CARE_PROVIDER_SITE_OTHER): Payer: 59 | Admitting: Licensed Clinical Social Worker

## 2018-11-13 ENCOUNTER — Ambulatory Visit (INDEPENDENT_AMBULATORY_CARE_PROVIDER_SITE_OTHER): Payer: 59 | Admitting: Psychiatry

## 2018-11-13 VITALS — BP 102/69 | HR 89 | Ht 68.25 in | Wt 132.0 lb

## 2018-11-13 DIAGNOSIS — F331 Major depressive disorder, recurrent, moderate: Secondary | ICD-10-CM

## 2018-11-13 DIAGNOSIS — F17200 Nicotine dependence, unspecified, uncomplicated: Secondary | ICD-10-CM | POA: Diagnosis not present

## 2018-11-13 DIAGNOSIS — F5105 Insomnia due to other mental disorder: Secondary | ICD-10-CM | POA: Diagnosis not present

## 2018-11-13 MED ORDER — ARIPIPRAZOLE 2 MG PO TABS: 2.0000 mg | ORAL_TABLET | Freq: Every day | ORAL | 0 refills | Status: DC
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
## 2018-11-22 ENCOUNTER — Encounter: Payer: Self-pay | Admitting: Psychiatry

## 2018-11-22 ENCOUNTER — Ambulatory Visit (INDEPENDENT_AMBULATORY_CARE_PROVIDER_SITE_OTHER): Payer: 59 | Admitting: Psychiatry

## 2018-11-22 VITALS — BP 119/78 | HR 100 | Ht 68.25 in | Wt 133.8 lb

## 2018-11-22 DIAGNOSIS — F331 Major depressive disorder, recurrent, moderate: Secondary | ICD-10-CM | POA: Diagnosis not present

## 2018-11-22 DIAGNOSIS — F17200 Nicotine dependence, unspecified, uncomplicated: Secondary | ICD-10-CM

## 2018-11-22 DIAGNOSIS — F5105 Insomnia due to other mental disorder: Secondary | ICD-10-CM | POA: Diagnosis not present

## 2018-11-22 MED ORDER — VENLAFAXINE HCL ER 37.5 MG PO CP24: 37.5000 mg | ORAL_CAPSULE | Freq: Every day | ORAL | 1 refills | Status: DC

## 2018-11-22 MED ORDER — ARIPIPRAZOLE 2 MG PO TABS: 2.0000 mg | ORAL_TABLET | Freq: Every day | ORAL | 0 refills | Status: DC

## 2018-11-22 MED ORDER — VENLAFAXINE HCL ER 150 MG PO CP24: 150.0000 mg | ORAL_CAPSULE | Freq: Every day | ORAL | 1 refills | Status: DC
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
## 2018-11-30 ENCOUNTER — Other Ambulatory Visit: Payer: Self-pay | Admitting: Psychiatry

## 2018-11-30 DIAGNOSIS — F5105 Insomnia due to other mental disorder: Secondary | ICD-10-CM

## 2018-11-30 DIAGNOSIS — F331 Major depressive disorder, recurrent, moderate: Secondary | ICD-10-CM

## 2018-12-04 ENCOUNTER — Ambulatory Visit: Payer: 59 | Admitting: Licensed Clinical Social Worker

## 2018-12-25 ENCOUNTER — Ambulatory Visit (INDEPENDENT_AMBULATORY_CARE_PROVIDER_SITE_OTHER): Payer: 59 | Admitting: Psychiatry

## 2018-12-25 ENCOUNTER — Encounter: Payer: Self-pay | Admitting: Psychiatry

## 2018-12-25 ENCOUNTER — Ambulatory Visit (INDEPENDENT_AMBULATORY_CARE_PROVIDER_SITE_OTHER): Payer: 59 | Admitting: Licensed Clinical Social Worker

## 2018-12-25 VITALS — BP 116/71 | HR 71 | Ht 68.5 in | Wt 134.0 lb

## 2018-12-25 DIAGNOSIS — F17200 Nicotine dependence, unspecified, uncomplicated: Secondary | ICD-10-CM | POA: Diagnosis not present

## 2018-12-25 DIAGNOSIS — F5105 Insomnia due to other mental disorder: Secondary | ICD-10-CM

## 2018-12-25 DIAGNOSIS — F331 Major depressive disorder, recurrent, moderate: Secondary | ICD-10-CM | POA: Diagnosis not present

## 2018-12-25 MED ORDER — GABAPENTIN 300 MG PO CAPS: 300.0000 mg | ORAL_CAPSULE | ORAL | 1 refills | Status: DC
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
## 2018-12-29 NOTE — Progress Notes (Signed)
   THERAPIST PROGRESS NOTE  Session Time: 59mn  Participation Level: Active  Behavioral Response: CasualAlertEuthymic  Type of Therapy: Individual Therapy  Treatment Goals addressed: Coping  Interventions: CBT and Motivational Interviewing  Summary: ALEESHA VENOis a 43y.o. female who presents with a reduction in symptoms. Therapist met with Patient in an outpatient setting to assess current mood and assist with making progress towards goals through the use of therapeutic intervention. Therapist did a brief mood check, assessing anger, fear, disgust, excitement, happiness, and sadness.  Patient reports "frustration" with her mother in law.  Patient expressed her feelings regarding her mother in law being judgemental with her.  Therapist asked Patient what the term "expressing feelings" means.  Therapist informed Patient that the activity will teach the technique of appropriately expressing feelings for remaining calm.  Therapist expressed that when you are upset, you can help yourself calm down by expressing what you are feelings right at the moment.  Saying your feelings out loud, to yourself or others, can help relieve tension.  But, it is important to tell your feelings without being hurtful to someone else.  Therapist role played with Patient in what type of situations these can be useful in.      Suicidal/Homicidal: No  Plan: Return again in 2 weeks.  Diagnosis: Axis I: Depression    Axis II: No diagnosis    NLubertha South LCSW 12/25/2018

## 2019-01-03 ENCOUNTER — Other Ambulatory Visit: Payer: Self-pay | Admitting: Psychiatry

## 2019-01-03 DIAGNOSIS — F341 Dysthymic disorder: Secondary | ICD-10-CM

## 2019-01-04 NOTE — Progress Notes (Signed)
   THERAPIST PROGRESS NOTE  Session Time:  Participation Level: Active  Behavioral Response: CasualAlertEuthymic  Type of Therapy: Individual Therapy  Treatment Goals addressed: Coping  Interventions: Solution Focused  Summary: Kristy Shaw is a 43 y.o. female who presents with a reduction in symptoms.  Engaged Patient in discussion about her current mood and symptoms that she is current experiencing.  Facilitated a discussion on her schedule and how she was able to manage.  Discussed her progress and regression. Assisted Patient with verbally listing things that are going well.  Shifted focus of discussion on medication management   Suicidal/Homicidal: No  Plan: Return again in 2 weeks.  Diagnosis: Axis I: Depression    Axis II: No diagnosis    Marinda Elk, LCSW 11/13/2018

## 2019-01-08 ENCOUNTER — Ambulatory Visit (INDEPENDENT_AMBULATORY_CARE_PROVIDER_SITE_OTHER): Payer: 59 | Admitting: Licensed Clinical Social Worker

## 2019-01-08 DIAGNOSIS — F331 Major depressive disorder, recurrent, moderate: Secondary | ICD-10-CM

## 2019-01-22 ENCOUNTER — Ambulatory Visit (HOSPITAL_BASED_OUTPATIENT_CLINIC_OR_DEPARTMENT_OTHER): Payer: 59 | Admitting: Licensed Clinical Social Worker

## 2019-01-22 DIAGNOSIS — F331 Major depressive disorder, recurrent, moderate: Secondary | ICD-10-CM

## 2019-01-23 ENCOUNTER — Other Ambulatory Visit: Payer: Self-pay | Admitting: Psychiatry

## 2019-01-24 ENCOUNTER — Other Ambulatory Visit: Payer: Self-pay | Admitting: Psychiatry

## 2019-01-24 DIAGNOSIS — F331 Major depressive disorder, recurrent, moderate: Secondary | ICD-10-CM

## 2019-01-29 ENCOUNTER — Other Ambulatory Visit: Payer: Self-pay

## 2019-01-29 ENCOUNTER — Ambulatory Visit (INDEPENDENT_AMBULATORY_CARE_PROVIDER_SITE_OTHER): Payer: 59 | Admitting: Psychiatry

## 2019-01-29 ENCOUNTER — Encounter: Payer: Self-pay | Admitting: Psychiatry

## 2019-01-29 VITALS — BP 102/70 | HR 90 | Temp 98.1°F | Wt 136.4 lb

## 2019-01-29 DIAGNOSIS — F331 Major depressive disorder, recurrent, moderate: Secondary | ICD-10-CM | POA: Diagnosis not present

## 2019-01-29 DIAGNOSIS — F5105 Insomnia due to other mental disorder: Secondary | ICD-10-CM

## 2019-01-29 DIAGNOSIS — F1590 Other stimulant use, unspecified, uncomplicated: Secondary | ICD-10-CM | POA: Diagnosis not present

## 2019-01-29 DIAGNOSIS — F17200 Nicotine dependence, unspecified, uncomplicated: Secondary | ICD-10-CM

## 2019-01-29 MED ORDER — CLONAZEPAM 0.5 MG PO TABS: 0.2500 mg | ORAL_TABLET | Freq: Every evening | ORAL | 0 refills | Status: DC | PRN
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
## 2019-01-29 NOTE — Patient Instructions (Signed)
Clonazepam tablets  What is this medicine?  CLONAZEPAM (kloe NA ze pam) is a benzodiazepine. It is used to treat certain types of seizures. It is also used to treat panic disorder.  This medicine may be used for other purposes; ask your health care provider or pharmacist if you have questions.  COMMON BRAND NAME(S): Ceberclon, Klonopin  What should I tell my health care provider before I take this medicine?  They need to know if you have any of these conditions:  -an alcohol or drug abuse problem  -bipolar disorder, depression, psychosis or other mental health condition  -glaucoma  -kidney or liver disease  -lung or breathing disease  -myasthenia gravis  -Parkinson's disease  -porphyria  -seizures or a history of seizures  -suicidal thoughts  -an unusual or allergic reaction to clonazepam, other benzodiazepines, foods, dyes, or preservatives  -pregnant or trying to get pregnant  -breast-feeding  How should I use this medicine?  Take this medicine by mouth with a glass of water. Follow the directions on the prescription label. If it upsets your stomach, take it with food or milk. Take your medicine at regular intervals. Do not take it more often than directed. Do not stop taking or change the dose except on the advice of your doctor or health care professional.  A special MedGuide will be given to you by the pharmacist with each prescription and refill. Be sure to read this information carefully each time.  Talk to your pediatrician regarding the use of this medicine in children. Special care may be needed.  Overdosage: If you think you have taken too much of this medicine contact a poison control center or emergency room at once.  NOTE: This medicine is only for you. Do not share this medicine with others.  What if I miss a dose?  If you miss a dose, take it as soon as you can. If it is almost time for your next dose, take only that dose. Do not take double or extra doses.  What may interact with this medicine?  Do  not take this medication with any of the following medicines:  -narcotic medicines for cough  -sodium oxybate  This medicine may also interact with the following medications:  -alcohol  -antihistamines for allergy, cough and cold  -antiviral medicines for HIV or AIDS  -certain medicines for anxiety or sleep  -certain medicines for depression, like amitriptyline, fluoxetine, sertraline  -certain medicines for fungal infections like ketoconazole and itraconazole  -certain medicines for seizures like carbamazepine, phenobarbital, phenytoin, primidone  -general anesthetics like halothane, isoflurane, methoxyflurane, propofol  -local anesthetics like lidocaine, pramoxine, tetracaine  -medicines that relax muscles for surgery  -narcotic medicines for pain  -phenothiazines like chlorpromazine, mesoridazine, prochlorperazine, thioridazine  This list may not describe all possible interactions. Give your health care provider a list of all the medicines, herbs, non-prescription drugs, or dietary supplements you use. Also tell them if you smoke, drink alcohol, or use illegal drugs. Some items may interact with your medicine.  What should I watch for while using this medicine?  Tell your doctor or health care professional if your symptoms do not start to get better or if they get worse.  Do not stop taking except on your doctor's advice. You may develop a severe reaction. Your doctor will tell you how much medicine to take.  You may get drowsy or dizzy. Do not drive, use machinery, or do anything that needs mental alertness until you know how this medicine   affects you. To reduce the risk of dizzy and fainting spells, do not stand or sit up quickly, especially if you are an older patient. Alcohol may increase dizziness and drowsiness. Avoid alcoholic drinks.  If you are taking another medicine that also causes drowsiness, you may have more side effects. Give your health care provider a list of all medicines you use. Your doctor  will tell you how much medicine to take. Do not take more medicine than directed. Call emergency for help if you have problems breathing or unusual sleepiness.  The use of this medicine may increase the chance of suicidal thoughts or actions. Pay special attention to how you are responding while on this medicine. Any worsening of mood, or thoughts of suicide or dying should be reported to your health care professional right away.  What side effects may I notice from receiving this medicine?  Side effects that you should report to your doctor or health care professional as soon as possible:  -allergic reactions like skin rash, itching or hives, swelling of the face, lips, or tongue  -breathing problems  -confusion  -loss of balance or coordination  -signs and symptoms of low blood pressure like dizziness; feeling faint or lightheaded, falls; unusually weak or tired  -suicidal thoughts or mood changes  Side effects that usually do not require medical attention (report to your doctor or health care professional if they continue or are bothersome):  -dizziness  -headache  -tiredness  -upset stomach  This list may not describe all possible side effects. Call your doctor for medical advice about side effects. You may report side effects to FDA at 1-800-FDA-1088.  Where should I keep my medicine?  Keep out of the reach of children. This medicine can be abused. Keep your medicine in a safe place to protect it from theft. Do not share this medicine with anyone. Selling or giving away this medicine is dangerous and against the law.  This medicine may cause accidental overdose and death if taken by other adults, children, or pets. Mix any unused medicine with a substance like cat litter or coffee grounds. Then throw the medicine away in a sealed container like a sealed bag or a coffee can with a lid. Do not use the medicine after the expiration date.  Store at room temperature between 15 and 30 degrees C (59 and 86 degrees F).  Protect from light. Keep container tightly closed.  NOTE: This sheet is a summary. It may not cover all possible information. If you have questions about this medicine, talk to your doctor, pharmacist, or health care provider.   2019 Elsevier/Gold Standard (2016-05-14 18:46:32)

## 2019-02-05 ENCOUNTER — Other Ambulatory Visit: Payer: Self-pay | Admitting: Family

## 2019-02-05 DIAGNOSIS — Z1231 Encounter for screening mammogram for malignant neoplasm of breast: Secondary | ICD-10-CM

## 2019-02-08 ENCOUNTER — Ambulatory Visit: Payer: 59 | Admitting: Psychiatry

## 2019-02-12 ENCOUNTER — Ambulatory Visit: Payer: 59 | Admitting: Licensed Clinical Social Worker

## 2019-02-19 ENCOUNTER — Encounter: Payer: Self-pay | Admitting: Psychiatry

## 2019-02-19 ENCOUNTER — Ambulatory Visit (INDEPENDENT_AMBULATORY_CARE_PROVIDER_SITE_OTHER): Payer: 59 | Admitting: Psychiatry

## 2019-02-19 VITALS — BP 112/76 | HR 90 | Ht 68.5 in | Wt 140.0 lb

## 2019-02-19 DIAGNOSIS — F1590 Other stimulant use, unspecified, uncomplicated: Secondary | ICD-10-CM

## 2019-02-19 DIAGNOSIS — F17200 Nicotine dependence, unspecified, uncomplicated: Secondary | ICD-10-CM | POA: Diagnosis not present

## 2019-02-19 DIAGNOSIS — F5105 Insomnia due to other mental disorder: Secondary | ICD-10-CM

## 2019-02-19 DIAGNOSIS — F331 Major depressive disorder, recurrent, moderate: Secondary | ICD-10-CM | POA: Diagnosis not present

## 2019-02-19 MED ORDER — ARIPIPRAZOLE 2 MG PO TABS: 4.0000 mg | ORAL_TABLET | Freq: Every day | ORAL | 0 refills | Status: DC
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
## 2019-02-21 ENCOUNTER — Ambulatory Visit
Admission: RE | Admit: 2019-02-21 | Discharge: 2019-02-21 | Disposition: A | Discharge status: Home / Self Care | Payer: Self-pay | Source: Ambulatory Visit | Attending: Family | Admitting: Family

## 2019-02-21 DIAGNOSIS — Z1231 Encounter for screening mammogram for malignant neoplasm of breast: Secondary | ICD-10-CM | POA: Insufficient documentation

## 2019-02-23 ENCOUNTER — Other Ambulatory Visit: Payer: Self-pay | Admitting: Psychiatry

## 2019-02-23 DIAGNOSIS — F5105 Insomnia due to other mental disorder: Secondary | ICD-10-CM

## 2019-02-23 DIAGNOSIS — F331 Major depressive disorder, recurrent, moderate: Secondary | ICD-10-CM

## 2019-02-27 ENCOUNTER — Telehealth: Payer: Self-pay

## 2019-02-27 DIAGNOSIS — F5105 Insomnia due to other mental disorder: Secondary | ICD-10-CM

## 2019-02-27 DIAGNOSIS — F331 Major depressive disorder, recurrent, moderate: Secondary | ICD-10-CM

## 2019-02-27 MED ORDER — VENLAFAXINE HCL ER 150 MG PO CP24: 150.0000 mg | ORAL_CAPSULE | Freq: Every day | ORAL | 1 refills | Status: DC

## 2019-02-27 MED ORDER — VENLAFAXINE HCL ER 37.5 MG PO CP24: 37.5000 mg | ORAL_CAPSULE | Freq: Every day | ORAL | 1 refills | Status: DC

## 2019-02-27 MED ORDER — ARIPIPRAZOLE 2 MG PO TABS: 4.0000 mg | ORAL_TABLET | Freq: Every day | ORAL | 0 refills | Status: DC

## 2019-02-27 MED ORDER — GABAPENTIN 300 MG PO CAPS: 300.0000 mg | ORAL_CAPSULE | ORAL | 1 refills | Status: DC

## 2019-02-27 NOTE — Telephone Encounter (Signed)
pt called left message that she doesnt have any rx insurance so the prescribions [facility] cost too much .  can you please send the rx to [facility] [place] it is cheaper there.

## 2019-02-27 NOTE — Telephone Encounter (Signed)
Sent meds to [facility]

## 2019-03-12 NOTE — Progress Notes (Signed)
   THERAPIST PROGRESS NOTE  Session Time:  Participation Level: Active  Behavioral Response: CasualAlertDepressed  Type of Therapy: Individual Therapy  Treatment Goals addressed: Coping  Interventions: CBT and Motivational Interviewing  Summary: Kristy Shaw is a 43 y.o. female who presents with continued symptoms of her diagnosis.  Therapist discussed Practical Facts about Depression with Patient .  Therapist educated Patient  on What is Depression and how it is diagnosed.  Reviewed with her Symptoms of depression and her current and past symptoms. Therapist assisted Patient  with completing an exercise on "symptoms of depression" in the Illness Management and Recovery workbook.  Discussion of when her symptoms seem to present themselves and the importance of having a journal to record information to learn about triggers.  Therapist normalized Patient 's thoughts about depression by discussion famous people who have been diagnosed with depression.  Therapist ended the session with a discussion on stigma.  Therapist encouraged Patient  to journal and to praise herself for the positives.    Suicidal/Homicidal: No  Plan: Return again in 2 weeks.  Diagnosis: Axis I: Depression    Axis II: No diagnosis    Marinda Elk, LCSW 01/08/2019

## 2019-03-14 ENCOUNTER — Ambulatory Visit: Payer: 59 | Admitting: Licensed Clinical Social Worker

## 2019-03-15 NOTE — Progress Notes (Signed)
   THERAPIST PROGRESS NOTE  Session Time: 60 min  Participation Level: Active  Behavioral Response: CasualAlertAnxious  Type of Therapy: Individual Therapy  Treatment Goals addressed: Anxiety  Interventions: CBT and Motivational Interviewing  Summary: Kristy Shaw is a 43 y.o. female who presents with continued symptoms. Therapist met with Patient in an outpatient setting to assess current mood and assist with making progress towards goals through the use of therapeutic intervention. Therapist did a brief mood check, assessing anger, fear, disgust, excitement, happiness, and sadness.  Patient reports "anger".  She reports that she has been angry with everyone lately. Therapist explored cognitive distortions and irrational thoughts. Therapist and Patient reviewed emotions and ways that our thoughts overshadow and influence our emotions.      Suicidal/Homicidal: No  Plan: Return again in 2 weeks.  Diagnosis: Axis I: Generalized Anxiety Disorder    Axis II: No diagnosis    Lubertha South, LCSW 01/22/2019

## 2019-03-20 ENCOUNTER — Ambulatory Visit (INDEPENDENT_AMBULATORY_CARE_PROVIDER_SITE_OTHER): Payer: 59 | Admitting: Psychiatry

## 2019-03-20 ENCOUNTER — Encounter: Payer: Self-pay | Admitting: Psychiatry

## 2019-03-20 ENCOUNTER — Other Ambulatory Visit: Payer: Self-pay

## 2019-03-20 DIAGNOSIS — F1590 Other stimulant use, unspecified, uncomplicated: Secondary | ICD-10-CM

## 2019-03-20 DIAGNOSIS — F5105 Insomnia due to other mental disorder: Secondary | ICD-10-CM | POA: Diagnosis not present

## 2019-03-20 DIAGNOSIS — F17210 Nicotine dependence, cigarettes, uncomplicated: Secondary | ICD-10-CM | POA: Diagnosis not present

## 2019-03-20 DIAGNOSIS — F17200 Nicotine dependence, unspecified, uncomplicated: Secondary | ICD-10-CM

## 2019-03-20 DIAGNOSIS — F331 Major depressive disorder, recurrent, moderate: Secondary | ICD-10-CM | POA: Diagnosis not present

## 2019-03-20 MED ORDER — VARENICLINE TARTRATE 0.5 MG PO TABS: ORAL_TABLET | ORAL | 1 refills | Status: DC

## 2019-03-20 MED ORDER — ARIPIPRAZOLE 5 MG PO TABS: 5.0000 mg | ORAL_TABLET | Freq: Every day | ORAL | 0 refills | Status: DC
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
## 2019-04-04 ENCOUNTER — Other Ambulatory Visit: Payer: Self-pay

## 2019-04-04 ENCOUNTER — Ambulatory Visit (INDEPENDENT_AMBULATORY_CARE_PROVIDER_SITE_OTHER): Payer: 59 | Admitting: Licensed Clinical Social Worker

## 2019-04-04 DIAGNOSIS — F331 Major depressive disorder, recurrent, moderate: Secondary | ICD-10-CM | POA: Diagnosis not present

## 2019-04-16 ENCOUNTER — Other Ambulatory Visit: Payer: Self-pay

## 2019-04-16 ENCOUNTER — Ambulatory Visit (INDEPENDENT_AMBULATORY_CARE_PROVIDER_SITE_OTHER): Payer: 59 | Admitting: Licensed Clinical Social Worker

## 2019-04-16 DIAGNOSIS — F331 Major depressive disorder, recurrent, moderate: Secondary | ICD-10-CM

## 2019-04-25 ENCOUNTER — Telehealth: Payer: Self-pay

## 2019-04-25 NOTE — Telephone Encounter (Signed)
went online and did the prior auth. -pending review.

## 2019-04-25 NOTE — Telephone Encounter (Signed)
prior auth needed for chantix.

## 2019-04-25 NOTE — Telephone Encounter (Signed)
rx was already been approved on [facility] [id number] from date 2018-10-09 through 2019-10-10

## 2019-05-02 ENCOUNTER — Encounter: Payer: Self-pay | Admitting: Psychiatry

## 2019-05-02 ENCOUNTER — Ambulatory Visit (INDEPENDENT_AMBULATORY_CARE_PROVIDER_SITE_OTHER): Payer: 59 | Admitting: Psychiatry

## 2019-05-02 ENCOUNTER — Other Ambulatory Visit: Payer: Self-pay

## 2019-05-02 DIAGNOSIS — F1590 Other stimulant use, unspecified, uncomplicated: Secondary | ICD-10-CM

## 2019-05-02 DIAGNOSIS — F331 Major depressive disorder, recurrent, moderate: Secondary | ICD-10-CM

## 2019-05-02 DIAGNOSIS — F5105 Insomnia due to other mental disorder: Secondary | ICD-10-CM

## 2019-05-02 DIAGNOSIS — F17200 Nicotine dependence, unspecified, uncomplicated: Secondary | ICD-10-CM

## 2019-05-02 MED ORDER — VARENICLINE TARTRATE 0.5 MG PO TABS: ORAL_TABLET | ORAL | 1 refills | Status: DC
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
## 2019-05-06 NOTE — Progress Notes (Signed)
Virtual Visit via Telephone Note  I connected with Kristy Shaw on 04/04/19 at 10:00 AM EDT by telephone and verified that I am speaking with the correct person using two identifiers.  Location: Patient: Home Provider: [facility]   I discussed the limitations, risks, security and privacy concerns of performing an evaluation and management service by telephone and the availability of in person appointments. I also discussed with the patient that there may be a patient responsible charge related to this service. The patient expressed understanding and agreed to proceed.   History of Present Illness: Depression    Observations/Objective:Patient reports frustration with her sister. Therapist facilitated a discussion on "managing conflict." Therapist discussed with Patient the importance of using decision making skills in the process of daily conflicts and situations. Therapist discussed the importance of conflict management is found in the realities of everyday living.  Therapist assisted Patient with defining conflict and management.  Therapist assisted with processing the skill components: 1. list your choices 2. list your consequences. 3. decide on the choice that has the least negative consequence. 4. Ask yourself if that choice is fair and conscientious.      Assessment and Plan: use coping skills to manage symptoms.   Follow Up Instructions:  2 weeks   I discussed the assessment and treatment plan with the patient. The patient was provided an opportunity to ask questions and all were answered. The patient agreed with the plan and demonstrated an understanding of the instructions.   The patient was advised to call back or seek an in-person evaluation if the symptoms worsen or if the condition fails to improve as anticipated.  I provided 60 minutes of non-face-to-face time during this encounter.   Marinda Elk, LCSW

## 2019-05-06 NOTE — Progress Notes (Signed)
No contact.

## 2019-06-06 ENCOUNTER — Ambulatory Visit (INDEPENDENT_AMBULATORY_CARE_PROVIDER_SITE_OTHER): Payer: 59 | Admitting: Licensed Clinical Social Worker

## 2019-06-06 ENCOUNTER — Other Ambulatory Visit: Payer: Self-pay

## 2019-06-06 DIAGNOSIS — F331 Major depressive disorder, recurrent, moderate: Secondary | ICD-10-CM

## 2019-06-15 ENCOUNTER — Ambulatory Visit (INDEPENDENT_AMBULATORY_CARE_PROVIDER_SITE_OTHER): Payer: 59 | Admitting: Psychiatry

## 2019-06-15 ENCOUNTER — Encounter: Payer: Self-pay | Admitting: Psychiatry

## 2019-06-15 ENCOUNTER — Other Ambulatory Visit: Payer: Self-pay

## 2019-06-15 DIAGNOSIS — F1590 Other stimulant use, unspecified, uncomplicated: Secondary | ICD-10-CM | POA: Diagnosis not present

## 2019-06-15 DIAGNOSIS — F331 Major depressive disorder, recurrent, moderate: Secondary | ICD-10-CM | POA: Diagnosis not present

## 2019-06-15 DIAGNOSIS — F17200 Nicotine dependence, unspecified, uncomplicated: Secondary | ICD-10-CM | POA: Diagnosis not present

## 2019-06-15 DIAGNOSIS — F5105 Insomnia due to other mental disorder: Secondary | ICD-10-CM | POA: Diagnosis not present

## 2019-06-15 HISTORY — DX: Other stimulant use, unspecified, uncomplicated: F15.90
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
## 2019-06-18 ENCOUNTER — Telehealth: Payer: Self-pay

## 2019-06-18 DIAGNOSIS — F331 Major depressive disorder, recurrent, moderate: Secondary | ICD-10-CM

## 2019-06-18 MED ORDER — ARIPIPRAZOLE 5 MG PO TABS: 5.0000 mg | ORAL_TABLET | Freq: Every day | ORAL | 0 refills | Status: DC

## 2019-06-18 NOTE — Telephone Encounter (Signed)
Pt called left message that she needs refills on abilify.    ARIPiprazole (ABILIFY) 5 MG tablet Medication Date: 03/20/2019 Department: [facility] Ordering/Authorizing: Ursula Alert, MD  Order Providers  Prescribing Provider Encounter Provider  Ursula Alert, MD Ursula Alert, MD  Outpatient Medication Detail   Disp Refills Start End   ARIPiprazole (ABILIFY) 5 MG tablet 90 tablet 0 03/20/2019    Sig - Route: Take 1 tablet (5 mg total) by mouth daily. - Oral   Sent to pharmacy as: ARIPiprazole (ABILIFY) 5 MG tablet   E-Prescribing Status: Receipt confirmed by pharmacy (03/20/2019 1:12 PM EDT)

## 2019-06-18 NOTE — Telephone Encounter (Signed)
Sent Abilify to pharmacy 

## 2019-07-04 ENCOUNTER — Other Ambulatory Visit: Payer: Self-pay

## 2019-07-04 ENCOUNTER — Ambulatory Visit: Payer: 59 | Admitting: Licensed Clinical Social Worker

## 2019-07-12 ENCOUNTER — Other Ambulatory Visit: Payer: Self-pay | Admitting: Psychiatry

## 2019-07-12 DIAGNOSIS — F331 Major depressive disorder, recurrent, moderate: Secondary | ICD-10-CM

## 2019-08-06 ENCOUNTER — Other Ambulatory Visit: Payer: Self-pay

## 2019-08-06 ENCOUNTER — Ambulatory Visit (INDEPENDENT_AMBULATORY_CARE_PROVIDER_SITE_OTHER): Payer: 59 | Admitting: Psychiatry

## 2019-08-06 ENCOUNTER — Encounter: Payer: Self-pay | Admitting: Psychiatry

## 2019-08-06 DIAGNOSIS — F17200 Nicotine dependence, unspecified, uncomplicated: Secondary | ICD-10-CM

## 2019-08-06 DIAGNOSIS — F1590 Other stimulant use, unspecified, uncomplicated: Secondary | ICD-10-CM

## 2019-08-06 DIAGNOSIS — F3341 Major depressive disorder, recurrent, in partial remission: Secondary | ICD-10-CM

## 2019-08-06 DIAGNOSIS — F5105 Insomnia due to other mental disorder: Secondary | ICD-10-CM

## 2019-08-06 MED ORDER — VENLAFAXINE HCL ER 37.5 MG PO CP24: 37.5000 mg | ORAL_CAPSULE | Freq: Every day | ORAL | 1 refills | Status: DC

## 2019-08-06 MED ORDER — HYDROXYZINE PAMOATE 25 MG PO CAPS: ORAL_CAPSULE | ORAL | 2 refills | Status: DC

## 2019-08-06 MED ORDER — VARENICLINE TARTRATE 0.5 MG PO TABS: ORAL_TABLET | ORAL | 1 refills | Status: DC

## 2019-08-06 MED ORDER — VENLAFAXINE HCL ER 150 MG PO CP24: 150.0000 mg | ORAL_CAPSULE | Freq: Every day | ORAL | 1 refills | Status: DC

## 2019-08-06 MED ORDER — GABAPENTIN 300 MG PO CAPS: 300.0000 mg | ORAL_CAPSULE | ORAL | 1 refills | Status: DC
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
## 2019-09-26 ENCOUNTER — Encounter: Payer: Self-pay | Admitting: Psychiatry

## 2019-09-26 ENCOUNTER — Ambulatory Visit (INDEPENDENT_AMBULATORY_CARE_PROVIDER_SITE_OTHER): Payer: Self-pay | Admitting: Psychiatry

## 2019-09-26 ENCOUNTER — Other Ambulatory Visit: Payer: Self-pay

## 2019-09-26 DIAGNOSIS — F5105 Insomnia due to other mental disorder: Secondary | ICD-10-CM

## 2019-09-26 DIAGNOSIS — F1590 Other stimulant use, unspecified, uncomplicated: Secondary | ICD-10-CM

## 2019-09-26 DIAGNOSIS — F17200 Nicotine dependence, unspecified, uncomplicated: Secondary | ICD-10-CM

## 2019-09-26 DIAGNOSIS — F3341 Major depressive disorder, recurrent, in partial remission: Secondary | ICD-10-CM
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
## 2019-12-25 ENCOUNTER — Encounter: Payer: Self-pay | Admitting: Psychiatry

## 2019-12-25 ENCOUNTER — Other Ambulatory Visit: Payer: Self-pay

## 2019-12-25 ENCOUNTER — Ambulatory Visit (INDEPENDENT_AMBULATORY_CARE_PROVIDER_SITE_OTHER): Payer: BC Managed Care – PPO | Admitting: Psychiatry

## 2019-12-25 DIAGNOSIS — F1590 Other stimulant use, unspecified, uncomplicated: Secondary | ICD-10-CM | POA: Diagnosis not present

## 2019-12-25 DIAGNOSIS — F3342 Major depressive disorder, recurrent, in full remission: Secondary | ICD-10-CM

## 2019-12-25 DIAGNOSIS — F5105 Insomnia due to other mental disorder: Secondary | ICD-10-CM | POA: Diagnosis not present

## 2019-12-25 DIAGNOSIS — F17200 Nicotine dependence, unspecified, uncomplicated: Secondary | ICD-10-CM | POA: Diagnosis not present
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
## 2020-01-14 ENCOUNTER — Other Ambulatory Visit: Payer: Self-pay | Admitting: Psychiatry

## 2020-01-14 DIAGNOSIS — F331 Major depressive disorder, recurrent, moderate: Secondary | ICD-10-CM

## 2020-02-29 ENCOUNTER — Other Ambulatory Visit: Payer: Self-pay | Admitting: Psychiatry

## 2020-02-29 DIAGNOSIS — F3341 Major depressive disorder, recurrent, in partial remission: Secondary | ICD-10-CM

## 2020-02-29 DIAGNOSIS — F5105 Insomnia due to other mental disorder: Secondary | ICD-10-CM

## 2020-02-29 MED ORDER — VENLAFAXINE HCL ER 150 MG PO CP24: 150.0000 mg | ORAL_CAPSULE | Freq: Every day | ORAL | 1 refills | Status: DC

## 2020-02-29 NOTE — Telephone Encounter (Signed)
I have sent Effexor to pharmacy. 

## 2020-03-27 ENCOUNTER — Other Ambulatory Visit: Payer: Self-pay

## 2020-03-27 ENCOUNTER — Ambulatory Visit (INDEPENDENT_AMBULATORY_CARE_PROVIDER_SITE_OTHER): Payer: BC Managed Care – PPO | Admitting: Psychiatry

## 2020-03-27 ENCOUNTER — Encounter: Payer: Self-pay | Admitting: Psychiatry

## 2020-03-27 DIAGNOSIS — F5105 Insomnia due to other mental disorder: Secondary | ICD-10-CM

## 2020-03-27 DIAGNOSIS — F3342 Major depressive disorder, recurrent, in full remission: Secondary | ICD-10-CM

## 2020-03-27 DIAGNOSIS — F1590 Other stimulant use, unspecified, uncomplicated: Secondary | ICD-10-CM

## 2020-03-27 DIAGNOSIS — F17200 Nicotine dependence, unspecified, uncomplicated: Secondary | ICD-10-CM
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
## 2020-05-07 ENCOUNTER — Telehealth: Payer: Self-pay

## 2020-05-07 DIAGNOSIS — F331 Major depressive disorder, recurrent, moderate: Secondary | ICD-10-CM

## 2020-05-07 MED ORDER — ARIPIPRAZOLE 5 MG PO TABS: 5.0000 mg | ORAL_TABLET | Freq: Every day | ORAL | 0 refills | Status: DC

## 2020-05-07 NOTE — Telephone Encounter (Signed)
pt called states she needs her abilify sent to [facility]

## 2020-05-07 NOTE — Telephone Encounter (Signed)
I have sent Abilify to [facility].

## 2020-05-22 ENCOUNTER — Telehealth (INDEPENDENT_AMBULATORY_CARE_PROVIDER_SITE_OTHER): Payer: Self-pay | Admitting: Psychiatry

## 2020-05-22 ENCOUNTER — Other Ambulatory Visit: Payer: Self-pay

## 2020-05-22 ENCOUNTER — Encounter: Payer: Self-pay | Admitting: Psychiatry

## 2020-05-22 DIAGNOSIS — F1590 Other stimulant use, unspecified, uncomplicated: Secondary | ICD-10-CM

## 2020-05-22 DIAGNOSIS — F5105 Insomnia due to other mental disorder: Secondary | ICD-10-CM

## 2020-05-22 DIAGNOSIS — F3342 Major depressive disorder, recurrent, in full remission: Secondary | ICD-10-CM

## 2020-05-22 DIAGNOSIS — F17200 Nicotine dependence, unspecified, uncomplicated: Secondary | ICD-10-CM

## 2020-05-22 MED ORDER — GABAPENTIN 300 MG PO CAPS: 300.0000 mg | ORAL_CAPSULE | ORAL | 1 refills | Status: DC
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
## 2020-08-27 ENCOUNTER — Other Ambulatory Visit: Payer: Self-pay

## 2020-08-27 ENCOUNTER — Encounter: Payer: Self-pay | Admitting: Psychiatry

## 2020-08-27 ENCOUNTER — Telehealth (INDEPENDENT_AMBULATORY_CARE_PROVIDER_SITE_OTHER): Payer: 59 | Admitting: Psychiatry

## 2020-08-27 DIAGNOSIS — F1590 Other stimulant use, unspecified, uncomplicated: Secondary | ICD-10-CM | POA: Diagnosis not present

## 2020-08-27 DIAGNOSIS — F17200 Nicotine dependence, unspecified, uncomplicated: Secondary | ICD-10-CM

## 2020-08-27 DIAGNOSIS — F3342 Major depressive disorder, recurrent, in full remission: Secondary | ICD-10-CM | POA: Diagnosis not present

## 2020-08-27 DIAGNOSIS — F5105 Insomnia due to other mental disorder: Secondary | ICD-10-CM | POA: Diagnosis not present

## 2020-08-27 MED ORDER — VENLAFAXINE HCL ER 150 MG PO CP24: 150.0000 mg | ORAL_CAPSULE | Freq: Every day | ORAL | 1 refills | Status: DC

## 2020-08-27 MED ORDER — HYDROXYZINE PAMOATE 25 MG PO CAPS: ORAL_CAPSULE | ORAL | 2 refills | Status: DC

## 2020-08-27 MED ORDER — ARIPIPRAZOLE 5 MG PO TABS: 5.0000 mg | ORAL_TABLET | Freq: Every day | ORAL | 0 refills | Status: DC

## 2020-08-27 MED ORDER — VENLAFAXINE HCL ER 37.5 MG PO CP24: 37.5000 mg | ORAL_CAPSULE | Freq: Every day | ORAL | 1 refills | Status: DC
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
## 2020-08-28 ENCOUNTER — Other Ambulatory Visit: Payer: Self-pay | Admitting: Internal Medicine

## 2020-08-28 DIAGNOSIS — Z1231 Encounter for screening mammogram for malignant neoplasm of breast: Secondary | ICD-10-CM

## 2020-09-09 ENCOUNTER — Other Ambulatory Visit: Payer: Self-pay

## 2020-09-09 ENCOUNTER — Ambulatory Visit: Payer: Self-pay | Admitting: Dermatology

## 2020-09-09 DIAGNOSIS — L308 Other specified dermatitis: Secondary | ICD-10-CM

## 2020-09-09 MED ORDER — CLOBETASOL PROPIONATE 0.05 % EX SOLN: CUTANEOUS | 0 refills | Status: DC

## 2020-09-09 NOTE — Patient Instructions (Addendum)
Atopic Dermatitis  "Dermatitis" means inflammation of the skin.  "Atopic" dermatitis is a particular type of skin inflammation that is marked by dryness, associated itching, and a characteristic pattern of rash on the body.  The condition is fairly common and may occur in as many as 10% of children.  You will often hear it called "atopic eczema" or sometimes just "eczema".  The exact cause of atopic dermatitis is unknown.  In many patients, there is a family history of hay fever, asthma, or atopic dermatitis itself.  Rarely, atopic dermatitis in infants may be related to food sensitivity, such as sensitivity to milk, but this is often difficult to determine and manage.  In the majority of cases, however, no allergic triggers can be found.  Physical or emotional stressors (severe seasonal allergies, physical illness, etc.) can worsen atopic dermatitis.  Atopic dermatitis usually starts in infancy from the ages of 2 to 6 months.  The skin is dry and the rash is quite itchy, so infants may be restless and rub against the sheets or scratch (if able).  The rash may involve the face or it may cover a large part of the body.  As the child gets older, the rash may become more localized.  In early childhood, the rash is commonly on the legs, feet, hands and arms.  As a child becomes older, the rash may be limited to the bend of the elbows, knees, on the back of the hands, feet, and on the neck and face.  When the rash becomes more established, the dry itchy skin may become thickened, leathery and sometimes darker in coloration.  The more the person scratches, the worse the rash is and the thicker the skin gets.  Many children with atopic dermatitis outgrow the condition before school age, while others continue to have problems into adolescence and adulthood.  Many things may affect the severity of the condition.  All patients have sensitive and dry skin.  Many will find that during the winter months when the humidity  is very low, the dryness and itchiness will be worse.  On the other hand, some people are easily irritated by sweat and will find that they have more problems during the summer months.  Most patients note an increase in itching at times when there are sudden changes in temperature.  Other irritants easily affect the skin of a patient with atopic dermatitis.  Use of harsh soaps or detergents and exposure to wool are common problems.  Sometimes atopic dermatitis may become infected by bacteria, yeast or viruses.  This is called "secondary infection".  Bacterial secondary infection is the most common and is often a result of scratching.  The rash gets very red with pus-filled pimples and scabs.  If this occurs, your doctor will prescribe an antibiotic to control the infection.  A more serious complication can be caused by certain viruses.  The "cold sore" virus (herpes simplex) may cause a severe rash.  If this is suspected, immediately contact your doctor.   What can I expect from treatment? Unfortunately, there is no "magic" cure that will always eliminate atopic dermatitis.  The main objective in treating atopic dermatitis is to decrease the skin eruption and relieve the itching.  There are a number of different forms of the medications that are used for atopic dermatitis.  Primarily, topical medications will be used.  Because the skin is excessively dry, moisturizers will be recommended that will effectively decrease the dryness.  Daily bathing is  a useful way to get water into the skin but bathing should be brief (no more than 10 minutes unless otherwise indicated by your physician).  Effective moisturizers (Cetaphil cream or lotion, CeraVe cream or lotion [[employer], [employer]S, and Walgreens], Aquaphor, and plain Vaseline) can be used immediately after the bath or shower to trap moisture within the skin.  It is best to "pat dry" after a bathing and then place your moisturizer (cream or lotion) on your skin.   Cortisone (steroid) is a medicated ointment or cream (eg. triamcinolone, hydrocortisone, desonide, betamethasone, clobetasol) that may also be suggested.  It is very helpful in decreasing the itching and controlling the inflammation.  Your doctor will prescribe a cortisone treatment that is most appropriate for the severity and location of the dermatitis that is to be treated.   Once the affected area clears up, it is best to discontinue the use of the cortisone preparation due to possibility of atrophy (skin thinning), but continue the regular use of moisturizers to try to prevent new areas of dermatitis from occurring.  Of course, if itching or a new rash begins, the cortisone preparation may have to be started again.  Anti-inflammatory creams and ointments which are not steroids such as Protopic and Elidel may also be prescribed.  Certain internal medicines called antihistamines (eg. Atarax, Benadryl, hydroxyzine) may help control itching.  They primarily help with the itching by introducing some drowsiness and allowing you to sleep at night.  Some oral antibiotics are often useful as well for controlling the secondary infection and enable infected dermatitis to be controlled.  Other important forms of treatment: 1. Avoid contact with substances you know to cause itching.  These may include soaps, detergents, certain perfumes, dust, grass, weeds, wools, and other types of scratchy clothing. 2. You may bathe daily.  Use no soap or the minimal amount necessary to get clean.  Always use moisturizer immediately after bathing (within 3 minutes is best).  Avoid very hot or very cold water.  Avoid bubble baths.  When drying with a towel, pat dry and do not rub. Use a mild, unscented soap (Dove, CeraVe Cleanser, Lever 2000, or Cetaphil). 3. Try to keep the temperature and humidity in the home fairly constant.  Use a bedroom air conditioner in the summer and a humidifier in the winter.  It is very important that  the humidifier be cleaned frequently and thoroughly since mold may grow and cause allergies. 4. Try to avoid scratching.  Atopic dermatitis is often called "the itch that rashes" and it is known that scratching plays a significant role in making atopic dermatitis worse.  Keeping the nails short and well-filed is helpful. 5. Use a fragrance-free, sensitive skin laundry detergent (eg. All Free & Clear).  Run clothes through a second rinse cycle to remove any residual detergents and chemicals.  Bed linens and towels should be washed in hot water to kill dust mites, which are common allergen in atopic patients. 6. In the bedroom, minimize rugs and curtains or other loose fabrics that collect dust.  The National Eczema Association (www.eczema-assn.org) is a wonderful organization that sends out a Dealer with useful information on these types of conditions. Please consider contacting them at the above website or by address: National Eczema Association for Science and Education, [address], [address], [place], [address]   Eczema Skin Care  Buy TWO 16oz jars of CeraVe moisturizing cream  [facility], [facility], [employer] (no prescription needed)  Costs about $15 per  jar   Jar #1: Use as a moisturizer as needed. Can be applied to any area of the body. Use twice daily to unaffected areas.  Jar #2: Pour one 55ml bottle of clobetasol 0.05% solution into jar, mix well. Label this jar to indicate the medication has been added. Use twice daily to affected areas. Do not apply to face, groin or underarms.  Moisturizer may burn or sting initially. Try for at least 4 weeks.   May spot treat itchy spots with TMC 0.1% cream.   Gentle Skin Care Guide  1. Bathe no more than once a day.  2. Avoid bathing in hot water  3. Use a mild soap like Dove, Vanicream, Cetaphil, CeraVe. Can use Lever 2000 or Cetaphil antibacterial soap  4. Use soap only where you need it. On most days, use it under your  arms, between your legs, and on your feet. Let the water rinse other areas unless visibly dirty.  5. When you get out of the bath/shower, use a towel to gently blot your skin dry, don't rub it.  6. While your skin is still a little damp, apply a moisturizing cream such as Vanicream, CeraVe, Cetaphil, Eucerin, Sarna lotion or plain Vaseline Jelly. For hands apply Neutrogena [place] Hand Cream or Excipial Hand Cream.  7. Reapply moisturizer any time you start to itch or feel dry.  8. Sometimes using free and clear laundry detergents can be helpful. Fabric softener sheets should be avoided. Downy Free & Gentle liquid, or any liquid fabric softener that is free of dyes and perfumes, it acceptable to use  9. If your doctor has given you prescription creams you may apply moisturizers over them   Topical steroids (such as triamcinolone, fluocinolone, fluocinonide, mometasone, clobetasol, halobetasol, betamethasone, hydrocortisone) can cause thinning and lightening of the skin if they are used for too long in the same area. Your physician has selected the right strength medicine for your problem and area affected on the body. Please use your medication only as directed by your physician to prevent side effects.

## 2020-09-09 NOTE — Progress Notes (Signed)
   New Patient Visit  Subjective  Kristy Shaw is a 44 y.o. female who presents for the following: Rash.  Patient here today for rash present for about 2 months. It is mostly at right side, some at left. Nothing at legs. She saw her PCP about 1 1/2 months ago and was told it was eczema. She could not get Eucrisa so they gave her a rx for TMC 0.1% cream and told her not to use it for more than 7 days. It did help with the itching but not the spread of the rash. She has not used it for about 2 weeks. No history of eczema. Nobody else at home itching with rash. No changes in detergents or soaps.  Patient has asthma and seasonal allergies.   The following portions of the chart were reviewed this encounter and updated as appropriate:      Review of Systems:  No other skin or systemic complaints except as noted in HPI or Assessment and Plan.  Objective  Well appearing patient in no apparent distress; mood and affect are within normal limits.  A focused examination was performed including face, neck, chest and back. Relevant physical exam findings are noted in the Assessment and Plan.  Objective  trunk: Pink scaly papules with excoriations on flank, back and abdomen, R > L Pink excoriated papules at right posterior upper arm Xerosis skin trunk, arms   Assessment & Plan  Other eczema trunk  Probable atopic Start clobetasol/CeraVe mix. Pour one 26ml bottle of clobetasol 0.05% solution into jar, mix well. Label this jar to indicate the medication has been added. Use twice daily to affected areas. Do not apply to face, groin or underarms.  Topical steroids (such as triamcinolone, fluocinolone, fluocinonide, mometasone, clobetasol, halobetasol, betamethasone, hydrocortisone) can cause thinning and lightening of the skin if they are used for too long in the same area. Your physician has selected the right strength medicine for your problem and area affected on the body. Please use your  medication only as directed by your physician to prevent side effects.   Recommend Gentle Skin Care- info given  May spot treat itchy spots with TMC 0.1% cream bid. Avoid f/g/a  clobetasol (TEMOVATE) 0.05 % external solution - trunk  Return in about 1 month (around 10/09/2020) for eczema follow up.  Anise Salvo, RMA, am acting as scribe for Willeen Niece, MD . Documentation: I have reviewed the above documentation for accuracy and completeness, and I agree with the above.  Willeen Niece MD

## 2020-09-18 ENCOUNTER — Other Ambulatory Visit: Payer: Self-pay

## 2020-09-18 ENCOUNTER — Ambulatory Visit
Admission: RE | Admit: 2020-09-18 | Discharge: 2020-09-18 | Disposition: A | Discharge status: Home / Self Care | Payer: 59 | Source: Ambulatory Visit | Attending: Internal Medicine | Admitting: Internal Medicine

## 2020-09-18 DIAGNOSIS — Z1231 Encounter for screening mammogram for malignant neoplasm of breast: Secondary | ICD-10-CM | POA: Diagnosis not present

## 2020-09-19 ENCOUNTER — Other Ambulatory Visit: Payer: Self-pay | Admitting: Internal Medicine

## 2020-09-22 ENCOUNTER — Other Ambulatory Visit: Payer: Self-pay | Admitting: Internal Medicine

## 2020-09-22 DIAGNOSIS — R928 Other abnormal and inconclusive findings on diagnostic imaging of breast: Secondary | ICD-10-CM

## 2020-09-25 ENCOUNTER — Ambulatory Visit
Admission: RE | Admit: 2020-09-25 | Discharge: 2020-09-25 | Disposition: A | Discharge status: Home / Self Care | Payer: 59 | Source: Ambulatory Visit | Attending: Internal Medicine | Admitting: Internal Medicine

## 2020-09-25 ENCOUNTER — Other Ambulatory Visit: Payer: Self-pay | Admitting: Internal Medicine

## 2020-09-25 DIAGNOSIS — R928 Other abnormal and inconclusive findings on diagnostic imaging of breast: Secondary | ICD-10-CM

## 2020-09-29 ENCOUNTER — Other Ambulatory Visit: Payer: Self-pay | Admitting: Family

## 2020-09-29 DIAGNOSIS — R928 Other abnormal and inconclusive findings on diagnostic imaging of breast: Secondary | ICD-10-CM

## 2020-09-29 DIAGNOSIS — N6310 Unspecified lump in the right breast, unspecified quadrant: Secondary | ICD-10-CM

## 2020-10-15 ENCOUNTER — Ambulatory Visit: Payer: 59 | Admitting: Dermatology

## 2020-10-29 ENCOUNTER — Other Ambulatory Visit: Payer: Self-pay

## 2020-10-29 ENCOUNTER — Ambulatory Visit: Payer: 59 | Admitting: Dermatology

## 2020-10-29 ENCOUNTER — Encounter: Payer: Self-pay | Admitting: Dermatology

## 2020-10-29 DIAGNOSIS — L2089 Other atopic dermatitis: Secondary | ICD-10-CM

## 2020-10-29 MED ORDER — TACROLIMUS 0.1 % EX OINT: TOPICAL_OINTMENT | Freq: Two times a day (BID) | CUTANEOUS | 1 refills | Status: DC

## 2020-10-29 MED ORDER — DUPIXENT 300 MG/2ML ~~LOC~~ SOAJ: 300.0000 mg | SUBCUTANEOUS | 5 refills | Status: DC

## 2020-10-29 MED ORDER — DUPIXENT 300 MG/2ML ~~LOC~~ SOAJ: 600.0000 mg | Freq: Once | SUBCUTANEOUS | 0 refills | Status: AC

## 2020-10-29 NOTE — Patient Instructions (Addendum)
Gentle Skin Care Guide  1. Bathe no more than once a day.  2. Avoid bathing in hot water  3. Use a mild soap like Dove, Vanicream, Cetaphil, CeraVe. Can use Lever 2000 or Cetaphil antibacterial soap  4. Use soap only where you need it. On most days, use it under your arms, between your legs, and on your feet. Let the water rinse other areas unless visibly dirty.  5. When you get out of the bath/shower, use a towel to gently blot your skin dry, don't rub it.  6. While your skin is still a little damp, apply a moisturizing cream such as Vanicream, CeraVe, Cetaphil, Eucerin, Sarna lotion or plain Vaseline Jelly. For hands apply Neutrogena [place] Hand Cream or Excipial Hand Cream.  7. Reapply moisturizer any time you start to itch or feel dry.  8. Sometimes using free and clear laundry detergents can be helpful. Fabric softener sheets should be avoided. Downy Free & Gentle liquid, or any liquid fabric softener that is free of dyes and perfumes, it acceptable to use  9. If your doctor has given you prescription creams you may apply moisturizers over them    Dupilumab (Dupixent) is a treatment given by injection for adults with moderate-to-severe atopic dermatitis. Goal is control of skin condition, not cure. It is given as 2 injections at the first dose followed by 1 injection ever 2 weeks thereafter.  Potential side effects include allergic reaction, herpes infections, injection site reactions and conjunctivitis (inflammation of the eyes).  The use of Dupixent requires long term medication management, including periodic office visits. 

## 2020-10-29 NOTE — Progress Notes (Addendum)
   Follow-Up Visit   Subjective  Kristy Shaw is a 44 y.o. female who presents for the following: Rash (2 month recheck. Using Clobetasol sol/CeraVe cream, has not used Triamcinolone very much. Clearing in some areas, flaring in others.). Cream does help with itching. Insurance would not cover [place]. Uses Dove for sensitive skin. No Hx of eczema as a child. No changes with soaps, lotions, laundry detergent, pets. Does have asthma and allergies.  Overall, about the same.  Itchy rash is still there.    The following portions of the chart were reviewed this encounter and updated as appropriate:     Review of Systems: No other skin or systemic complaints except as noted in HPI or Assessment and Plan.  Objective  Well appearing patient in no apparent distress; mood and affect are within normal limits.  A focused examination was performed including face, neck, chest and back. Relevant physical exam findings are noted in the Assessment and Plan.  Objective  abdomen, right upper back: Pink scaly papules and patches with excoriations at right posterior shoulder and abdomen. 6% BSA  Assessment & Plan  Other atopic dermatitis abdomen, right upper back  Reviewed chronic nature.  Continued flare  Continue Clobetasol sol/CeraVe cream BID PRN worse areas. Start Tacrolimus 0.1% oint BID Recommend using Eucerin Calming anti itch lotion during day.  Discussed patch testing.  Discussed Dupixent. Rx sent today Michelene Heady).   Dupilumab (Dupixent) is a treatment given by injection for adults with moderate-to-severe atopic dermatitis. Goal is control of skin condition, not cure. It is given as 2 injections at the first dose followed by 1 injection ever 2 weeks thereafter.  Potential side effects include allergic reaction, herpes infections, injection site reactions and conjunctivitis (inflammation of the eyes).  The use of Dupixent requires long term medication management, including periodic  office visits.   tacrolimus (PROTOPIC) 0.1 % ointment - abdomen, right upper back  Dupilumab (DUPIXENT) 300 MG/2ML SOPN - abdomen, right upper back  Dupilumab (DUPIXENT) 300 MG/2ML SOPN - abdomen, right upper back  Return in about 2 months (around 12/29/2020) for atopic dermatitis recheck.   I, Lawson Radar, CMA, am acting as scribe for Willeen Niece, MD.  Documentation: I have reviewed the above documentation for accuracy and completeness, and I agree with the above.  Willeen Niece MD

## 2020-11-04 ENCOUNTER — Telehealth: Payer: Self-pay

## 2020-11-04 NOTE — Telephone Encounter (Signed)
We received a fax from [place] stating that pts insurance is requiring BSA%, SCORAD index score, and EASI score. Could you update last visit note with these numbers and I will send the note over to [facility] to finish the prior authorization for Dupixent? Thank you.

## 2020-11-04 NOTE — Telephone Encounter (Signed)
I put in the BSA percent in her note (6%).  I have no idea how to calculate those other numbers, we never do that in clinical practice.  Maybe done in clinical trials or academic settings.

## 2020-11-10 ENCOUNTER — Telehealth: Payer: 59 | Admitting: Psychiatry

## 2020-11-17 ENCOUNTER — Telehealth: Payer: Self-pay

## 2020-11-17 NOTE — Telephone Encounter (Signed)
Dupixent is not being covered by insurance at this time. See denial in [facility] portal.   Per Dr. Roseanne Reno have patient come in for patch testing.   Patient will discuss further at follow up with Dr. Roseanne Reno and possibly start patch testing in January.

## 2020-11-21 ENCOUNTER — Encounter: Payer: Self-pay | Admitting: Psychiatry

## 2020-11-21 ENCOUNTER — Other Ambulatory Visit: Payer: Self-pay

## 2020-11-21 ENCOUNTER — Telehealth (INDEPENDENT_AMBULATORY_CARE_PROVIDER_SITE_OTHER): Payer: 59 | Admitting: Psychiatry

## 2020-11-21 DIAGNOSIS — F1590 Other stimulant use, unspecified, uncomplicated: Secondary | ICD-10-CM | POA: Diagnosis not present

## 2020-11-21 DIAGNOSIS — F17200 Nicotine dependence, unspecified, uncomplicated: Secondary | ICD-10-CM | POA: Diagnosis not present

## 2020-11-21 DIAGNOSIS — F3342 Major depressive disorder, recurrent, in full remission: Secondary | ICD-10-CM | POA: Diagnosis not present

## 2020-11-21 DIAGNOSIS — F5105 Insomnia due to other mental disorder: Secondary | ICD-10-CM

## 2020-11-21 MED ORDER — ARIPIPRAZOLE 5 MG PO TABS: 2.5000 mg | ORAL_TABLET | Freq: Every day | ORAL | 0 refills | Status: DC

## 2020-11-21 MED ORDER — GABAPENTIN 300 MG PO CAPS: 300.0000 mg | ORAL_CAPSULE | ORAL | 1 refills | Status: DC
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
## 2020-12-31 ENCOUNTER — Other Ambulatory Visit: Payer: Self-pay

## 2020-12-31 ENCOUNTER — Encounter: Payer: Self-pay | Admitting: Dermatology

## 2020-12-31 ENCOUNTER — Ambulatory Visit: Payer: 59 | Admitting: Dermatology

## 2020-12-31 DIAGNOSIS — L2089 Other atopic dermatitis: Secondary | ICD-10-CM

## 2020-12-31 MED ORDER — DUPIXENT 300 MG/2ML ~~LOC~~ SOSY: 600.0000 mg | PREFILLED_SYRINGE | Freq: Once | SUBCUTANEOUS | 0 refills | Status: AC

## 2020-12-31 MED ORDER — OPZELURA 1.5 % EX CREA: 1.0000 "application " | TOPICAL_CREAM | Freq: Two times a day (BID) | CUTANEOUS | 2 refills | Status: DC

## 2020-12-31 MED ORDER — DUPIXENT 300 MG/2ML ~~LOC~~ SOSY: 300.0000 mg | PREFILLED_SYRINGE | SUBCUTANEOUS | 5 refills | Status: DC

## 2020-12-31 NOTE — Patient Instructions (Addendum)
Gentle Skin Care Guide  1. Bathe no more than once a day.  2. Avoid bathing in hot water  3. Use a mild soap like Dove, Vanicream, Cetaphil, CeraVe. Can use Lever 2000 or Cetaphil antibacterial soap  4. Use soap only where you need it. On most days, use it under your arms, between your legs, and on your feet. Let the water rinse other areas unless visibly dirty.  5. When you get out of the bath/shower, use a towel to gently blot your skin dry, don't rub it.  6. While your skin is still a little damp, apply a moisturizing cream such as Vanicream, CeraVe, Cetaphil, Eucerin, Sarna lotion or plain Vaseline Jelly. For hands apply Neutrogena [place] Hand Cream or Excipial Hand Cream.  7. Reapply moisturizer any time you start to itch or feel dry.  8. Sometimes using free and clear laundry detergents can be helpful. Fabric softener sheets should be avoided. Downy Free & Gentle liquid, or any liquid fabric softener that is free of dyes and perfumes, it acceptable to use  9. If your doctor has given you prescription creams you may apply moisturizers over them    Dupilumab (Dupixent) is a treatment given by injection for adults with moderate-to-severe atopic dermatitis. Goal is control of skin condition, not cure. It is given as 2 injections at the first dose followed by 1 injection ever 2 weeks thereafter.  Potential side effects include allergic reaction, herpes infections, injection site reactions and conjunctivitis (inflammation of the eyes).  The use of Dupixent requires long term medication management, including periodic office visits.

## 2020-12-31 NOTE — Progress Notes (Signed)
   Follow-Up Visit   Subjective  Kristy Shaw is a 45 y.o. female who presents for the following: Dermatitis (2 month follow up. Back, abdomen. Itching more now, all over. Using Clobetasol and Tacrolimus. Uses Dove unscented soap. Using CeraVe/Clobetasol sol. Mix and CeraVe cream as directed. Dupixent was denied by insurance. Patient must do patch testing first. ).    The following portions of the chart were reviewed this encounter and updated as appropriate:      Review of Systems: No other skin or systemic complaints except as noted in HPI or Assessment and Plan.  Objective  Well appearing patient in no apparent distress; mood and affect are within normal limits.  A focused examination was performed including face, neck, chest and back and face, arms, abdomen, back, legs. Relevant physical exam findings are noted in the Assessment and Plan.  Objective  torso, arms, legs, face: Hyperpigmented violaceous patches on abdomen, right upper and lower back, right hip. Erythematous excoriations at abdomen, left inguinal area, right eyebrow. Pink excoriated papules at left arm and shoulder, left upper back, right knee. Diffuse xerosis scattered body. Moderate to marked itching at entire body.  BSA 10%  Assessment & Plan  Other atopic dermatitis torso, arms, legs, face  Not improving with topical treatment.  Itching all over. Eucrisa denied, Dupixent denied.  Pt has worsened since last insurance prior auth.  Will submit again. Discussed patch testing. Will schedule next week. May consider Prednisone post patch test if itching not improved, but this would be temporary treatment. Recommend mild soap and moisturizing cream 1-2 times daily.    Continue Tacrolimus ointment at groin areas and on face BID PRN.  Continue Clobetasol sol./CeraVe cream BID up to 2 weeks on aas PRN as directed.  Start Opzelura cream BID to aas as directed  Dupixent sent to [facility] today. Dupilumab (Dupixent)  is a treatment given by injection for adults with moderate-to-severe atopic dermatitis. Goal is control of skin condition, not cure. It is given as 2 injections at the first dose followed by 1 injection ever 2 weeks thereafter.  Potential side effects include allergic reaction, herpes infections, injection site reactions and conjunctivitis (inflammation of the eyes).  The use of Dupixent requires long term medication management, including periodic office visits.   Atopic dermatitis (eczema) is a chronic, relapsing, pruritic condition that can significantly affect quality of life. It is often associated with allergic rhinitis and/or asthma and can require treatment with topical medications, phototherapy, or in severe cases a biologic medication called Dupixent in older children and adults.     Ruxolitinib Phosphate (OPZELURA) 1.5 % CREA - torso, arms, legs, face  dupilumab (DUPIXENT) 300 MG/2ML prefilled syringe - torso, arms, legs, face  dupilumab (DUPIXENT) 300 MG/2ML prefilled syringe - torso, arms, legs, face  Other Related Medications tacrolimus (PROTOPIC) 0.1 % ointment  Return in about 5 days (around 01/05/2021) for nurse visit patch testing.   I, Lawson Radar, CMA, am acting as scribe for Willeen Niece, MD.  Documentation: I have reviewed the above documentation for accuracy and completeness, and I agree with the above.  Willeen Niece MD

## 2021-01-05 ENCOUNTER — Ambulatory Visit: Payer: 59

## 2021-01-07 ENCOUNTER — Ambulatory Visit: Payer: 59

## 2021-01-12 ENCOUNTER — Other Ambulatory Visit: Payer: Self-pay

## 2021-01-12 ENCOUNTER — Ambulatory Visit (INDEPENDENT_AMBULATORY_CARE_PROVIDER_SITE_OTHER): Payer: 59

## 2021-01-12 ENCOUNTER — Ambulatory Visit: Payer: 59 | Admitting: Dermatology

## 2021-01-12 DIAGNOSIS — L239 Allergic contact dermatitis, unspecified cause: Secondary | ICD-10-CM

## 2021-01-12 NOTE — Progress Notes (Signed)
TRUE test X36 applied to patient's back today.  Patient advised to keep panels dry and not to scratch until removal on Wednesday.  All questions answered with patient regarding patch testing.

## 2021-01-14 ENCOUNTER — Ambulatory Visit: Payer: 59

## 2021-01-14 ENCOUNTER — Other Ambulatory Visit: Payer: Self-pay

## 2021-01-14 DIAGNOSIS — L239 Allergic contact dermatitis, unspecified cause: Secondary | ICD-10-CM

## 2021-01-14 NOTE — Progress Notes (Signed)
Patient here for Day 3 of patch testing.  True Test panels removed. Patient showed positive reaction to site #28.  Patient advised okay to shower and get back wet but to not scrub or soak the testing area.  Patient advised how to read patch testing until follow up Monday with Dr. Roseanne Reno.

## 2021-01-16 ENCOUNTER — Encounter: Payer: Self-pay | Admitting: Psychiatry

## 2021-01-16 ENCOUNTER — Other Ambulatory Visit: Payer: Self-pay

## 2021-01-16 ENCOUNTER — Telehealth (INDEPENDENT_AMBULATORY_CARE_PROVIDER_SITE_OTHER): Payer: 59 | Admitting: Psychiatry

## 2021-01-16 DIAGNOSIS — F3342 Major depressive disorder, recurrent, in full remission: Secondary | ICD-10-CM | POA: Diagnosis not present

## 2021-01-16 DIAGNOSIS — F1590 Other stimulant use, unspecified, uncomplicated: Secondary | ICD-10-CM

## 2021-01-16 DIAGNOSIS — F5105 Insomnia due to other mental disorder: Secondary | ICD-10-CM | POA: Diagnosis not present

## 2021-01-16 DIAGNOSIS — F17200 Nicotine dependence, unspecified, uncomplicated: Secondary | ICD-10-CM | POA: Diagnosis not present
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
## 2021-01-19 ENCOUNTER — Ambulatory Visit: Payer: 59 | Admitting: Dermatology

## 2021-01-19 ENCOUNTER — Other Ambulatory Visit: Payer: Self-pay

## 2021-01-19 DIAGNOSIS — L309 Dermatitis, unspecified: Secondary | ICD-10-CM | POA: Diagnosis not present

## 2021-01-19 DIAGNOSIS — L2089 Other atopic dermatitis: Secondary | ICD-10-CM

## 2021-01-19 MED ORDER — OPZELURA 1.5 % EX CREA: 1.0000 "application " | TOPICAL_CREAM | Freq: Two times a day (BID) | CUTANEOUS | 2 refills | Status: DC

## 2021-01-19 MED ORDER — DUPIXENT 300 MG/2ML ~~LOC~~ SOAJ: 300.0000 mg | SUBCUTANEOUS | 5 refills | Status: DC

## 2021-01-19 MED ORDER — DUPIXENT 300 MG/2ML ~~LOC~~ SOAJ: 600.0000 mg | Freq: Once | SUBCUTANEOUS | 0 refills | Status: AC

## 2021-01-19 NOTE — Patient Instructions (Addendum)
Topical steroids (such as triamcinolone, fluocinolone, fluocinonide, mometasone, clobetasol, halobetasol, betamethasone, hydrocortisone) can cause thinning and lightening of the skin if they are used for too long in the same area. Your physician has selected the right strength medicine for your problem and area affected on the body. Please use your medication only as directed by your physician to prevent side effects.   Dupilumab (Dupixent) is a treatment given by injection for adults with moderate-to-severe atopic dermatitis. Goal is control of skin condition, not cure. It is given as 2 injections at the first dose followed by 1 injection ever 2 weeks thereafter.  Potential side effects include allergic reaction, herpes infections, injection site reactions and conjunctivitis (inflammation of the eyes).  The use of Dupixent requires long term medication management, including periodic office visits.   Atopic Dermatitis  "Dermatitis" means inflammation of the skin.  "Atopic" dermatitis is a particular type of skin inflammation that is marked by dryness, associated itching, and a characteristic pattern of rash on the body.  The condition is fairly common and may occur in as many as 10% of children.  You will often hear it called "atopic eczema" or sometimes just "eczema".  The exact cause of atopic dermatitis is unknown.  In many patients, there is a family history of hay fever, asthma, or atopic dermatitis itself.  Rarely, atopic dermatitis in infants may be related to food sensitivity, such as sensitivity to milk, but this is often difficult to determine and manage.  In the majority of cases, however, no allergic triggers can be found.  Physical or emotional stressors (severe seasonal allergies, physical illness, etc.) can worsen atopic dermatitis.  Atopic dermatitis usually starts in infancy from the ages of 2 to 6 months.  The skin is dry and the rash is quite itchy, so infants may be restless and rub  against the sheets or scratch (if able).  The rash may involve the face or it may cover a large part of the body.  As the child gets older, the rash may become more localized.  In early childhood, the rash is commonly on the legs, feet, hands and arms.  As a child becomes older, the rash may be limited to the bend of the elbows, knees, on the back of the hands, feet, and on the neck and face.  When the rash becomes more established, the dry itchy skin may become thickened, leathery and sometimes darker in coloration.  The more the person scratches, the worse the rash is and the thicker the skin gets.  Many children with atopic dermatitis outgrow the condition before school age, while others continue to have problems into adolescence and adulthood.  Many things may affect the severity of the condition.  All patients have sensitive and dry skin.  Many will find that during the winter months when the humidity is very low, the dryness and itchiness will be worse.  On the other hand, some people are easily irritated by sweat and will find that they have more problems during the summer months.  Most patients note an increase in itching at times when there are sudden changes in temperature.  Other irritants easily affect the skin of a patient with atopic dermatitis.  Use of harsh soaps or detergents and exposure to wool are common problems.  Sometimes atopic dermatitis may become infected by bacteria, yeast or viruses.  This is called "secondary infection".  Bacterial secondary infection is the most common and is often a result of scratching.  The  rash gets very red with pus-filled pimples and scabs.  If this occurs, your doctor will prescribe an antibiotic to control the infection.  A more serious complication can be caused by certain viruses.  The "cold sore" virus (herpes simplex) may cause a severe rash.  If this is suspected, immediately contact your doctor.   What can I expect from treatment? Unfortunately,  there is no "magic" cure that will always eliminate atopic dermatitis.  The main objective in treating atopic dermatitis is to decrease the skin eruption and relieve the itching.  There are a number of different forms of the medications that are used for atopic dermatitis.  Primarily, topical medications will be used.  Because the skin is excessively dry, moisturizers will be recommended that will effectively decrease the dryness.  Daily bathing is a useful way to get water into the skin but bathing should be brief (no more than 10 minutes unless otherwise indicated by your physician).  Effective moisturizers (Cetaphil cream or lotion, CeraVe cream or lotion [[employer], [employer]S, and Walgreens], Aquaphor, and plain Vaseline) can be used immediately after the bath or shower to trap moisture within the skin.  It is best to "pat dry" after a bathing and then place your moisturizer (cream or lotion) on your skin.  Cortisone (steroid) is a medicated ointment or cream (eg. triamcinolone, hydrocortisone, desonide, betamethasone, clobetasol) that may also be suggested.  It is very helpful in decreasing the itching and controlling the inflammation.  Your doctor will prescribe a cortisone treatment that is most appropriate for the severity and location of the dermatitis that is to be treated.   Once the affected area clears up, it is best to discontinue the use of the cortisone preparation due to possibility of atrophy (skin thinning), but continue the regular use of moisturizers to try to prevent new areas of dermatitis from occurring.  Of course, if itching or a new rash begins, the cortisone preparation may have to be started again.  Anti-inflammatory creams and ointments which are not steroids such as Protopic and Elidel may also be prescribed.  Certain internal medicines called antihistamines (eg. Atarax, Benadryl, hydroxyzine) may help control itching.  They primarily help with the itching by introducing some drowsiness  and allowing you to sleep at night.  Some oral antibiotics are often useful as well for controlling the secondary infection and enable infected dermatitis to be controlled.  Other important forms of treatment: 1. Avoid contact with substances you know to cause itching.  These may include soaps, detergents, certain perfumes, dust, grass, weeds, wools, and other types of scratchy clothing. 2. You may bathe daily.  Use no soap or the minimal amount necessary to get clean.  Always use moisturizer immediately after bathing (within 3 minutes is best).  Avoid very hot or very cold water.  Avoid bubble baths.  When drying with a towel, pat dry and do not rub. Use a mild, unscented soap (Dove, CeraVe Cleanser, Lever 2000, or Cetaphil). 3. Try to keep the temperature and humidity in the home fairly constant.  Use a bedroom air conditioner in the summer and a humidifier in the winter.  It is very important that the humidifier be cleaned frequently and thoroughly since mold may grow and cause allergies. 4. Try to avoid scratching.  Atopic dermatitis is often called "the itch that rashes" and it is known that scratching plays a significant role in making atopic dermatitis worse.  Keeping the nails short and well-filed is helpful. 5. Use a  fragrance-free, sensitive skin laundry detergent (eg. All Free & Clear).  Run clothes through a second rinse cycle to remove any residual detergents and chemicals.  Bed linens and towels should be washed in hot water to kill dust mites, which are common allergen in atopic patients. 6. In the bedroom, minimize rugs and curtains or other loose fabrics that collect dust.  The National Eczema Association (www.eczema-assn.org) is a wonderful organization that sends out a Dealer with useful information on these types of conditions. Please consider contacting them at the above website or by address: National Eczema [employer] for Science and Education, [address], [address], [place], [address]

## 2021-01-19 NOTE — Progress Notes (Addendum)
° °  Follow-Up Visit   Subjective  Kristy Shaw is a 45 y.o. female who presents for the following: Follow-up (Patient presents today for day 7 of True Test Patch Testing. She noted the only reaction to #28 Gold. She has dermatitis on arms, legs, trunk and is using Clobetasol/CeraVe mix and tacrolimus ointment. She never heard back about Rx Opzelura. ).  She does not wear gold jewelry or have any other exposure to gold that she knows of.  Dupixent was denied.  She continues to itch all over despite topical treatment.   The following portions of the chart were reviewed this encounter and updated as appropriate:       Review of Systems:  No other skin or systemic complaints except as noted in HPI or Assessment and Plan.  Objective  Well appearing patient in no apparent distress; mood and affect are within normal limits.  A focused examination was performed including face, trunk, extremities. Relevant physical exam findings are noted in the Assessment and Plan.  Objective  Back: Positive reaction to #28 Gold. Rest of true test is negative Multiple excoriated papules on arms, hands, left back, lateral thigh, L hip, R knee; hyperpigmented macules on back, abd.  10% BSA    Assessment & Plan  Dermatitis Back  Chronic condition- continued pruritus Continue clobetasol/CeraVe mix qd/bid. Avoid face, groin, axilla. Continue Protopic ointment qd/bid. Restart Hydroxyzine 25mg  1 po qhs (pt has Rx) Start Opzelura cream bid to aas- Rx sent to [facility], since not able to get it at [place] given on [employer], Gold.  Not relevant to current rash, since no exposure.  Pt will avoid.  Discussed light box NBUVB treatments. Patient is not able to come to the office multiple times a week for treatment due to her work schedule.  Will resubmit Rx for Dupixent to [facility], Initial and Maintenance.  BSA 10%  Topical steroids (such as triamcinolone, fluocinolone, fluocinonide,  mometasone, clobetasol, halobetasol, betamethasone, hydrocortisone) can cause thinning and lightening of the skin if they are used for too long in the same area. Your physician has selected the right strength medicine for your problem and area affected on the body. Please use your medication only as directed by your physician to prevent side effects.   Dupilumab (Dupixent) is a treatment given by injection for adults with moderate-to-severe atopic dermatitis. Goal is control of skin condition, not cure. It is given as 2 injections at the first dose followed by 1 injection ever 2 weeks thereafter.  Potential side effects include allergic reaction, herpes infections, injection site reactions and conjunctivitis (inflammation of the eyes).  The use of Dupixent requires long term medication management, including periodic office visits.   Dupilumab (DUPIXENT) 300 MG/2ML SOPN - Back  Dupilumab (DUPIXENT) 300 MG/2ML SOPN - Back  Other atopic dermatitis  Reordered Medications Ruxolitinib Phosphate (OPZELURA) 1.5 % CREA  Other Related Medications tacrolimus (PROTOPIC) 0.1 % ointment dupilumab (DUPIXENT) 300 MG/2ML prefilled syringe  Return in about 2 months (around 03/19/2021) for dermatitis.  I4/01/2021, CMA, am acting as scribe for Cherlyn Labella, MD .  Documentation: I have reviewed the above documentation for accuracy and completeness, and I agree with the above.  Willeen Niece MD

## 2021-02-03 ENCOUNTER — Telehealth: Payer: Self-pay

## 2021-02-03 DIAGNOSIS — F419 Anxiety disorder, unspecified: Secondary | ICD-10-CM

## 2021-02-03 DIAGNOSIS — F3342 Major depressive disorder, recurrent, in full remission: Secondary | ICD-10-CM

## 2021-02-03 MED ORDER — BUSPIRONE HCL 5 MG PO TABS: 5.0000 mg | ORAL_TABLET | Freq: Three times a day (TID) | ORAL | 0 refills | Status: DC

## 2021-02-03 NOTE — Telephone Encounter (Signed)
pt called left message that she having issues with her medicaiton and didnt knwo if she should have changes
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
## 2021-02-10 ENCOUNTER — Encounter: Payer: Self-pay | Admitting: Psychiatry

## 2021-02-10 ENCOUNTER — Other Ambulatory Visit: Payer: Self-pay

## 2021-02-10 ENCOUNTER — Telehealth (INDEPENDENT_AMBULATORY_CARE_PROVIDER_SITE_OTHER): Payer: 59 | Admitting: Psychiatry

## 2021-02-10 DIAGNOSIS — Z9111 Patient's noncompliance with dietary regimen: Secondary | ICD-10-CM

## 2021-02-10 DIAGNOSIS — F330 Major depressive disorder, recurrent, mild: Secondary | ICD-10-CM

## 2021-02-10 DIAGNOSIS — F17200 Nicotine dependence, unspecified, uncomplicated: Secondary | ICD-10-CM | POA: Diagnosis not present

## 2021-02-10 DIAGNOSIS — G4701 Insomnia due to medical condition: Secondary | ICD-10-CM | POA: Diagnosis not present

## 2021-02-10 DIAGNOSIS — Z91199 Patient's noncompliance with other medical treatment and regimen due to unspecified reason: Secondary | ICD-10-CM | POA: Insufficient documentation

## 2021-02-10 DIAGNOSIS — F419 Anxiety disorder, unspecified: Secondary | ICD-10-CM | POA: Diagnosis not present

## 2021-02-10 DIAGNOSIS — F1590 Other stimulant use, unspecified, uncomplicated: Secondary | ICD-10-CM

## 2021-02-10 HISTORY — DX: Patient's noncompliance with other medical treatment and regimen due to unspecified reason: Z91.199
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
## 2021-02-19 ENCOUNTER — Other Ambulatory Visit: Payer: Self-pay

## 2021-02-19 ENCOUNTER — Ambulatory Visit (INDEPENDENT_AMBULATORY_CARE_PROVIDER_SITE_OTHER): Payer: 59 | Admitting: Licensed Clinical Social Worker

## 2021-02-19 DIAGNOSIS — F330 Major depressive disorder, recurrent, mild: Secondary | ICD-10-CM | POA: Diagnosis not present
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
## 2021-03-05 ENCOUNTER — Other Ambulatory Visit: Payer: Self-pay

## 2021-03-05 ENCOUNTER — Ambulatory Visit (INDEPENDENT_AMBULATORY_CARE_PROVIDER_SITE_OTHER): Payer: 59 | Admitting: Licensed Clinical Social Worker

## 2021-03-05 DIAGNOSIS — F330 Major depressive disorder, recurrent, mild: Secondary | ICD-10-CM | POA: Diagnosis not present
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
## 2021-03-06 ENCOUNTER — Telehealth: Payer: 59 | Admitting: Psychiatry

## 2021-03-16 ENCOUNTER — Telehealth: Payer: Self-pay

## 2021-03-16 ENCOUNTER — Other Ambulatory Visit: Payer: Self-pay

## 2021-03-16 ENCOUNTER — Ambulatory Visit: Payer: 59 | Admitting: Dermatology

## 2021-03-16 NOTE — Telephone Encounter (Signed)
Patient came in today for follow up visit but had to leave due to wait time.  She has been approved through [employer] for free drug 03/13/21-03/13/22.  Patient states first delivery will be Wednesday. She states she has no new changes, just needs injection training. Please advise.

## 2021-03-16 NOTE — Telephone Encounter (Signed)
Tried calling patient but no answer and VM full.

## 2021-03-16 NOTE — Telephone Encounter (Signed)
She can get a nurse visit set up for Thursday for training, and then I can see her in 3 months for f/up, or sooner if she has any concerns.

## 2021-03-19 ENCOUNTER — Ambulatory Visit: Payer: 59

## 2021-03-26 ENCOUNTER — Other Ambulatory Visit: Payer: Self-pay

## 2021-03-26 ENCOUNTER — Ambulatory Visit (INDEPENDENT_AMBULATORY_CARE_PROVIDER_SITE_OTHER): Payer: 59 | Admitting: Licensed Clinical Social Worker

## 2021-03-26 DIAGNOSIS — F330 Major depressive disorder, recurrent, mild: Secondary | ICD-10-CM | POA: Diagnosis not present

## 2021-03-26 DIAGNOSIS — F419 Anxiety disorder, unspecified: Secondary | ICD-10-CM

## 2021-03-26 NOTE — Progress Notes (Signed)
Virtual Visit via Video Note  I connected with Kristy Shaw on 03/26/21 at  2:00 PM EDT by a video enabled telemedicine application and verified that I am speaking with the correct person using two identifiers.  Location: Patient: home Provider: remote office [place], [place])   I discussed the limitations of evaluation and management by telemedicine and the availability of in person appointments. The patient expressed understanding and agreed to proceed.   I discussed the assessment and treatment plan with the patient. The patient was provided an opportunity to ask questions and all were answered. The patient agreed with the plan and demonstrated an understanding of the instructions.   The patient was advised to call back or seek an in-person evaluation if the symptoms worsen or if the condition fails to improve as anticipated.  I provided 45 minutes of non-face-to-face time during this encounter.   Tommi Crepeau R Elliannah Wayment, LCSW    THERAPIST PROGRESS NOTE  Session Time: 2-2:45  Participation Level: Active  Behavioral Response: NAAlertAnxious  Type of Therapy: Individual Therapy  Treatment Goals addressed: Anxiety  Interventions: CBT  Summary: Kristy Shaw is a 45 y.o. female who presents with symptoms consistent with depression and anxiety. Pt reports that overall mood has continued to be stable since last session. Pt reports that pain associated with fibromyalgia is continuing. Pt reporting inconsistent sleep patterns--frequent waking and having nightsweats.  Allowed pt to explore and express thoughts and feelings about current family relationships in addition to pts continuing worries about somatic-based symptoms. Discussed pts parents and how parents are currently taking care of sister's 5yo daughter. "I know that I will end up taking care of her one day".   Discussed scratching/picking skin and ways that pt could try to manage that.   Continued recommendations are as  follows: self care behaviors, positive social engagements, focusing on overall work/home/life balance, and focusing on positive physical and emotional wellness.   Suicidal/Homicidal: No  Therapist Response: Kristy Shaw is able to identify scenarios that trigger stress/anxiety and plan for it accordingly. Kristy Shaw is able to utilize coping skills that have worked well in the past to help manage symptoms. Kristy Shaw is continuing to set limits and boundaries with family members. These behaviors are reflective of personal growth and progress. Treatment to continue  Plan: Return again in 4 weeks.  Diagnosis: Axis I: MDD, recurrent, mild; GAD    Axis II: No diagnosis    Ernest Haber Kristy Tortorelli, LCSW 03/26/2021

## 2021-03-27 ENCOUNTER — Ambulatory Visit
Admission: RE | Admit: 2021-03-27 | Discharge: 2021-03-27 | Disposition: A | Discharge status: Home / Self Care | Payer: 59 | Source: Ambulatory Visit | Attending: Family | Admitting: Family

## 2021-03-27 DIAGNOSIS — N6310 Unspecified lump in the right breast, unspecified quadrant: Secondary | ICD-10-CM | POA: Diagnosis present

## 2021-03-27 DIAGNOSIS — R928 Other abnormal and inconclusive findings on diagnostic imaging of breast: Secondary | ICD-10-CM | POA: Diagnosis not present

## 2021-03-31 ENCOUNTER — Ambulatory Visit: Payer: 59 | Admitting: Psychiatry

## 2021-04-01 ENCOUNTER — Other Ambulatory Visit: Payer: Self-pay | Admitting: Family

## 2021-04-01 DIAGNOSIS — R928 Other abnormal and inconclusive findings on diagnostic imaging of breast: Secondary | ICD-10-CM

## 2021-04-02 ENCOUNTER — Telehealth: Payer: Self-pay

## 2021-04-02 DIAGNOSIS — F5105 Insomnia due to other mental disorder: Secondary | ICD-10-CM

## 2021-04-02 DIAGNOSIS — F419 Anxiety disorder, unspecified: Secondary | ICD-10-CM

## 2021-04-02 NOTE — Telephone Encounter (Signed)
I see that Dr. [name] has prescribed Hydroxyzine for her for anxiety as needed. She can take two tablets of Atarax 25 mg for anxiety if needed. If it does not work then have her call back on Monday. Thanks Can you please call and convey this message?

## 2021-04-02 NOTE — Telephone Encounter (Signed)
pt called asked if she can have a couple of pills for anxiety. she has to have a biposy next week and is having alot of anxiety.

## 2021-04-03 NOTE — Telephone Encounter (Signed)
spoke with patient and she wants to speak with Dr. [name] about her up coming procedure she having done on Tuesday.

## 2021-04-06 MED ORDER — LORAZEPAM 0.5 MG PO TABS: 0.5000 mg | ORAL_TABLET | ORAL | 0 refills | Status: DC

## 2021-04-06 NOTE — Telephone Encounter (Signed)
Returned call to patient, provided lorazepam 0.5 mg take 1 to 2 tablets as needed prior to the breast biopsy procedure which is coming up.

## 2021-04-07 ENCOUNTER — Ambulatory Visit
Admission: RE | Admit: 2021-04-07 | Discharge: 2021-04-07 | Disposition: A | Discharge status: Home / Self Care | Payer: 59 | Source: Ambulatory Visit | Attending: Family | Admitting: Family

## 2021-04-07 ENCOUNTER — Other Ambulatory Visit: Payer: Self-pay

## 2021-04-07 DIAGNOSIS — R928 Other abnormal and inconclusive findings on diagnostic imaging of breast: Secondary | ICD-10-CM | POA: Diagnosis not present

## 2021-04-07 HISTORY — PX: BREAST BIOPSY: SHX20

## 2021-04-08 ENCOUNTER — Telehealth (INDEPENDENT_AMBULATORY_CARE_PROVIDER_SITE_OTHER): Payer: 59 | Admitting: Psychiatry

## 2021-04-08 ENCOUNTER — Encounter: Payer: Self-pay | Admitting: Psychiatry

## 2021-04-08 DIAGNOSIS — F17200 Nicotine dependence, unspecified, uncomplicated: Secondary | ICD-10-CM | POA: Diagnosis not present

## 2021-04-08 DIAGNOSIS — F5105 Insomnia due to other mental disorder: Secondary | ICD-10-CM | POA: Diagnosis not present

## 2021-04-08 DIAGNOSIS — Z91199 Patient's noncompliance with other medical treatment and regimen due to unspecified reason: Secondary | ICD-10-CM

## 2021-04-08 DIAGNOSIS — F419 Anxiety disorder, unspecified: Secondary | ICD-10-CM | POA: Diagnosis not present

## 2021-04-08 DIAGNOSIS — F3342 Major depressive disorder, recurrent, in full remission: Secondary | ICD-10-CM

## 2021-04-08 DIAGNOSIS — F1590 Other stimulant use, unspecified, uncomplicated: Secondary | ICD-10-CM

## 2021-04-08 DIAGNOSIS — F330 Major depressive disorder, recurrent, mild: Secondary | ICD-10-CM | POA: Insufficient documentation

## 2021-04-08 DIAGNOSIS — Z9111 Patient's noncompliance with dietary regimen: Secondary | ICD-10-CM

## 2021-04-08 LAB — SURGICAL PATHOLOGY

## 2021-04-08 MED ORDER — VENLAFAXINE HCL ER 150 MG PO CP24: 150.0000 mg | ORAL_CAPSULE | Freq: Every day | ORAL | 1 refills | Status: DC

## 2021-04-08 MED ORDER — VENLAFAXINE HCL ER 37.5 MG PO CP24: 37.5000 mg | ORAL_CAPSULE | Freq: Every day | ORAL | 1 refills | Status: DC
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
## 2021-04-09 ENCOUNTER — Ambulatory Visit: Payer: 59 | Admitting: Psychiatry

## 2021-04-23 ENCOUNTER — Ambulatory Visit (INDEPENDENT_AMBULATORY_CARE_PROVIDER_SITE_OTHER): Payer: 59 | Admitting: Licensed Clinical Social Worker

## 2021-04-23 ENCOUNTER — Other Ambulatory Visit: Payer: Self-pay

## 2021-04-23 DIAGNOSIS — F3342 Major depressive disorder, recurrent, in full remission: Secondary | ICD-10-CM

## 2021-04-23 DIAGNOSIS — F17200 Nicotine dependence, unspecified, uncomplicated: Secondary | ICD-10-CM

## 2021-04-23 NOTE — Progress Notes (Signed)
Virtual Visit via Video Note  I connected with Kristy Shaw on 04/23/21 at  2:00 PM EDT by a video enabled telemedicine application and verified that I am speaking with the correct person using two identifiers.  Location: Patient: home Provider: remote office [place], [place])   I discussed the limitations of evaluation and management by telemedicine and the availability of in person appointments. The patient expressed understanding and agreed to proceed.   I discussed the assessment and treatment plan with the patient. The patient was provided an opportunity to ask questions and all were answered. The patient agreed with the plan and demonstrated an understanding of the instructions.   The patient was advised to call back or seek an in-person evaluation if the symptoms worsen or if the condition fails to improve as anticipated.  I provided 60 minutes of non-face-to-face time during this encounter.   Kristy Shaw Kristy Rana Hochstein, LCSW    THERAPIST PROGRESS NOTE  Session Time: 2-3p  Participation Level: Active  Behavioral Response: Neat and Well GroomedAlertAnxious  Type of Therapy: Individual Therapy  Treatment Goals addressed: Anxiety  Interventions: CBT  Summary: Kristy Shaw is a 45 y.o. female who presents with symptoms associated with anxiety. Pt reports that overall mood has been stable and that she is trying hard to manage situational stressors in the moment.  Pt reports that recently a spot was found on a mammogram that triggered anxiety--pt was recommended to have biopsy and results were benign but mass needs to be removed. Allowed pt safe space to process through thoughts and feelings associated with this diagnosis.   Allowed pt to identify feelings--pt feels that sometimes she feels emotionally numb and that it is hard to cry. Discussed trauma and how sometimes emotional experience in the present can be reflection of past trauma. Explored past experiences and helped pt  identify situations from the past that could be triggering emotions and/or defense mechanisms.   Pt feels that she is angry a lot and needs strategies to help manage anger. Reviewed anger management strategies with pt and she reflected understanding. Will email stress management booklet.   Reviewed importance of self care at this time. Pt discussed smoking and making healthier choices--discussed [place] Quitline if pt decides to take that route "Im not quite ready yet".  Assured pt that clinician would support her unconditionally and that she would have to be the one to dictate when she is ready to take the next step to stop smoking. Discussed medical assistance--patches, gum, medication, etc.   Continued recommendations are as follows: self care behaviors, positive social engagements, focusing on overall work/home/life balance, and focusing on positive physical and emotional wellness.   Suicidal/Homicidal: No  Therapist Response: Kristy Shaw is able to identify scenarios that trigger stress/anxiety and plan for it accordingly. Kristy Shaw is able to utilize coping skills that have worked well in the past to help manage symptoms. Kristy Shaw is continuing to set limits and boundaries with family members. Kristy Shaw is willing to make positive changes in her overall well being by making behavior modifications. These behaviors are reflective of personal growth and progress. Treatment to continue  Plan: Return again in 4 weeks.  Diagnosis: Axis I: Major Depression, Recurrent severe; tobacco use disorder    Axis II: No diagnosis    Kristy Shaw Kristy Palmisano, LCSW 04/23/2021

## 2021-05-06 ENCOUNTER — Ambulatory Visit: Payer: Self-pay | Admitting: General Surgery

## 2021-05-06 DIAGNOSIS — N6031 Fibrosclerosis of right breast: Secondary | ICD-10-CM

## 2021-05-07 ENCOUNTER — Other Ambulatory Visit: Payer: Self-pay | Admitting: General Surgery

## 2021-05-07 DIAGNOSIS — N6031 Fibrosclerosis of right breast: Secondary | ICD-10-CM

## 2021-05-13 ENCOUNTER — Other Ambulatory Visit: Payer: Self-pay | Admitting: Psychiatry

## 2021-05-13 DIAGNOSIS — F3342 Major depressive disorder, recurrent, in full remission: Secondary | ICD-10-CM

## 2021-05-20 HISTORY — PX: BREAST EXCISIONAL BIOPSY: SUR124

## 2021-05-26 ENCOUNTER — Other Ambulatory Visit: Payer: Self-pay

## 2021-05-26 ENCOUNTER — Encounter: Payer: Self-pay | Admitting: Psychiatry

## 2021-05-26 ENCOUNTER — Telehealth (INDEPENDENT_AMBULATORY_CARE_PROVIDER_SITE_OTHER): Payer: 59 | Admitting: Psychiatry

## 2021-05-26 ENCOUNTER — Ambulatory Visit (INDEPENDENT_AMBULATORY_CARE_PROVIDER_SITE_OTHER): Payer: 59 | Admitting: Licensed Clinical Social Worker

## 2021-05-26 ENCOUNTER — Ambulatory Visit: Payer: 59 | Admitting: Licensed Clinical Social Worker

## 2021-05-26 DIAGNOSIS — G4710 Hypersomnia, unspecified: Secondary | ICD-10-CM

## 2021-05-26 DIAGNOSIS — F1590 Other stimulant use, unspecified, uncomplicated: Secondary | ICD-10-CM

## 2021-05-26 DIAGNOSIS — F418 Other specified anxiety disorders: Secondary | ICD-10-CM

## 2021-05-26 DIAGNOSIS — F330 Major depressive disorder, recurrent, mild: Secondary | ICD-10-CM | POA: Diagnosis not present

## 2021-05-26 DIAGNOSIS — F17200 Nicotine dependence, unspecified, uncomplicated: Secondary | ICD-10-CM

## 2021-05-26 DIAGNOSIS — Z9111 Patient's noncompliance with dietary regimen: Secondary | ICD-10-CM

## 2021-05-26 DIAGNOSIS — F3342 Major depressive disorder, recurrent, in full remission: Secondary | ICD-10-CM

## 2021-05-26 DIAGNOSIS — L2089 Other atopic dermatitis: Secondary | ICD-10-CM

## 2021-05-26 DIAGNOSIS — Z91199 Patient's noncompliance with other medical treatment and regimen due to unspecified reason: Secondary | ICD-10-CM

## 2021-05-26 DIAGNOSIS — G4730 Sleep apnea, unspecified: Secondary | ICD-10-CM

## 2021-05-26 MED ORDER — LORAZEPAM 1 MG PO TABS: 1.0000 mg | ORAL_TABLET | Freq: Two times a day (BID) | ORAL | 0 refills | Status: DC | PRN

## 2021-05-26 MED ORDER — ARIPIPRAZOLE 2 MG PO TABS: 2.0000 mg | ORAL_TABLET | Freq: Every day | ORAL | 1 refills | Status: DC

## 2021-05-26 MED ORDER — DUPIXENT 300 MG/2ML ~~LOC~~ SOSY: 300.0000 mg | PREFILLED_SYRINGE | SUBCUTANEOUS | 0 refills | Status: DC

## 2021-05-26 NOTE — Patient Instructions (Signed)
Aripiprazole Oral Tablets What is this medicine? ARIPIPRAZOLE (ay ri PIP ray zole) is an atypical antipsychotic. It is used to treat schizophrenia and bipolar disorder, also known as manic-depression. It is also used to treat Tourette's disorder and some symptoms of autism. This medicine may also be used in combination with antidepressants to treat major depressive disorder. This medicine may be used for other purposes; ask your health care provider or pharmacist if you have questions. COMMON BRAND NAME(S): Abilify What should I tell my health care provider before I take this medicine? They need to know if you have any of these conditions:  dementia  diabetes  difficulty swallowing  have trouble controlling your muscles  have urges you are unable to control (for example, gambling, spending money, or eating)  heart disease  history of irregular heartbeat  history of stroke  low blood counts, like low white cell, platelet, or red cell counts  low blood pressure  Parkinson's disease  seizures  suicidal thoughts, plans or attempt; a previous suicide attempt by you or a family member  an unusual or allergic reaction to aripiprazole, other medicines, foods, dyes, or preservatives  pregnant or trying to get pregnant  breast-feeding How should I use this medicine? Take this medicine by mouth with a glass of water. Follow the directions on the prescription label. You can take this medicine with or without food. Take your doses at regular intervals. Do not take your medicine more often than directed. Do not stop taking except on the advice of your doctor or health care professional. A special MedGuide will be given to you by the pharmacist with each prescription and refill. Be sure to read this information carefully each time. Talk to your pediatrician regarding the use of this medicine in children. While this drug may be prescribed for children as young as 6 years of age for selected  conditions, precautions do apply. Overdosage: If you think you have taken too much of this medicine contact a poison control center or emergency room at once. NOTE: This medicine is only for you. Do not share this medicine with others. What if I miss a dose? If you miss a dose, take it as soon as you can. If it is almost time for your next dose, take only that dose. Do not take double or extra doses. What may interact with this medicine? Do not take this medicine with any of the following medications:  brexpiprazole  cisapride  dextromethorphan; quinidine  dronedarone  metoclopramide  pimozide  quinidine  thioridazine This medicine may also interact with the following medications:  antihistamines for allergy, cough, and cold  carbamazepine  certain medicines for anxiety or sleep  certain medicines for depression like amitriptyline, fluoxetine, paroxetine, sertraline  certain medicines for fungal infections like fluconazole, itraconazole, ketoconazole, posaconazole, voriconazole  clarithromycin  general anesthetics like halothane, isoflurane, methoxyflurane, propofol  levodopa or other medicines for Parkinson's disease  medicines for blood pressure  medicines for seizures  medicines that relax muscles for surgery  narcotic medicines for pain  other medicines that prolong the QT interval (cause an abnormal heart rhythm)  phenothiazines like chlorpromazine, prochlorperazine  rifampin This list may not describe all possible interactions. Give your health care provider a list of all the medicines, herbs, non-prescription drugs, or dietary supplements you use. Also tell them if you smoke, drink alcohol, or use illegal drugs. Some items may interact with your medicine. What should I watch for while using this medicine? Visit your health care   professional for regular checks on your progress. Tell your health care professional if symptoms do not start to get better or  if they get worse. Do not stop taking except on your health care professional's advice. You may develop a severe reaction. Your health care professional will tell you how much medicine to take. Patients and their families should watch out for new or worsening depression or thoughts of suicide. Also watch out for sudden changes in feelings such as feeling anxious, agitated, panicky, irritable, hostile, aggressive, impulsive, severely restless, overly excited and hyperactive, or not being able to sleep. If this happens, especially at the beginning of antidepressant treatment or after a change in dose, call your health care professional. You may get dizzy or drowsy. Do not drive, use machinery, or do anything that needs mental alertness until you know how this medicine affects you. Do not stand or sit up quickly, especially if you are an older patient. This reduces the risk of dizzy or fainting spells. Alcohol may interfere with the effect of this medicine. Avoid alcoholic drinks. This drug can cause problems with controlling your body temperature. It can lower the response of your body to cold temperatures. If possible, stay indoors during cold weather. If you must go outdoors, wear warm clothes. It can also lower the response of your body to heat. Do not overheat. Do not over-exercise. Stay out of the sun when possible. If you must be in the sun, wear cool clothing. Drink plenty of water. If you have trouble controlling your body temperature, call your health care provider right away. This medicine may cause dry eyes and blurred vision. If you wear contact lenses, you may feel some discomfort. Lubricating drops may help. See your eye doctor if the problem does not go away or is severe. This medicine may increase blood sugar. Ask your health care provider if changes in diet or medicines are needed if you have diabetes. There are have been reports of increased sexual urges or other strong urges such as gambling  while taking this medicine. If you experience any of these while taking this medicine, you should report this to your health care professional as soon as possible. What side effects may I notice from receiving this medicine? Side effects that you should report to your doctor or health care professional as soon as possible:  allergic reactions like skin rash, itching or hives, swelling of the face, lips, or tongue  breathing problems  confusion  fast, irregular heartbeat  fever or chills, sore throat  inability to keep still  males: prolonged or painful erection  new or increased gambling urges, sexual urges, uncontrolled spending, binge or compulsive eating, or other urges  problems with balance, talking, walking  seizures  signs and symptoms of high blood sugar such as being more thirsty or hungry or having to urinate more than normal. You may also feel very tired or have blurry vision  signs and symptoms of low blood pressure like dizziness; feeling faint or lightheaded, falls; unusually weak or tired  signs and symptoms of neuroleptic malignant syndrome like confusion; fast or irregular heartbeat; high fever; increased sweating; stiff muscles  sudden numbness or weakness of the face, arm, or leg  suicidal thoughts or other mood changes  trouble swallowing  uncontrollable movements of the arms, face, head, mouth, neck, or upper body Side effects that usually do not require medical attention (report to your doctor or health care professional if they continue or are bothersome):  constipation    headache  nausea, vomiting  trouble sleeping  weight gain This list may not describe all possible side effects. Call your doctor for medical advice about side effects. You may report side effects to FDA at 1-800-FDA-1088. Where should I keep my medicine? Keep out of the reach of children. Store at room temperature between 15 and 30 degrees C (59 and 86 degrees F). Throw away  any unused medicine after the expiration date. NOTE: This sheet is a summary. It may not cover all possible information. If you have questions about this medicine, talk to your doctor, pharmacist, or health care provider.  2021 Elsevier/Gold Standard (2019-10-18 13:57:00)  
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
## 2021-05-26 NOTE — Progress Notes (Signed)
Virtual Visit via Video Note  I connected with Kristy Shaw on 05/26/21 at  2:00 PM EDT by a video enabled telemedicine application and verified that I am speaking with the correct person using two identifiers.  Location: Patient: home Provider: remote office Kristy Shaw, [place])   I discussed the limitations of evaluation and management by telemedicine and the availability of in person appointments. The patient expressed understanding and agreed to proceed.  I discussed the assessment and treatment plan with the patient. The patient was provided an opportunity to ask questions and all were answered. The patient agreed with the plan and demonstrated an understanding of the instructions.   The patient was advised to call back or seek an in-person evaluation if the symptoms worsen or if the condition fails to improve as anticipated.  I provided 60 minutes of non-face-to-face time during this encounter.   Kristy Shaw R Kristy Nantz, LCSW    THERAPIST PROGRESS NOTE  Session Time: 2-3p  Participation Level: Active  Behavioral Response: Neat and Well GroomedAlertAnxious  Type of Therapy: Individual Therapy  Treatment Goals addressed: Anxiety and Coping  Interventions: CBT  Summary: Kristy Shaw is a 44 y.o. female who presents with improving symptoms related to depression diagnosis. Pt reports that overall mood has been stable and that she is managing situational stress and anxiety symptoms well. Pt reports good quality and quantity of sleep.   Allowed pt to explore and express thoughts and feelings associated with recent life situations and external stressors. Pt is having surgery in a couple of weeks--this is triggering stress. Allowed pt safe space to explore feelings associated with this fear of having breast lump/biposy.   Explored thoughts/feelings associated with recent loss of uncle--this uncle had only lost his wife in 15-Apr-2023 so his death was a big shock to the family.    Explored relationship with sister--pt feels that this is improving but still feels like comparing herself to her sister at times. Discussed setting limits with this and being aware when its happening.  Discussed pts continuing irritability and anger feelings--discussed importance of self care, mindfulness, and relaxation strategies.   Continued recommendations are as follows: self care behaviors, positive social engagements, focusing on overall work/home/life balance, and focusing on positive physical and emotional wellness.   Suicidal/Homicidal: No  Therapist Response: Kristy Shaw is able to identify scenarios that trigger stress/anxiety and plan for it accordingly. Kristy Shaw is able to utilize coping skills that have worked well in the past to help manage symptoms. Kristy Shaw is continuing to set limits and boundaries with family members. [name] is willing to make positive changes in her overall well being by making behavior modifications. These behaviors are reflective of personal growth and progress. Treatment to continue  Plan: Return again in 4 weeks.  Diagnosis: Axis I: MDD, recurrent, remission    Axis II: No diagnosis    Kristy Shaw Kristy Muench, LCSW 05/26/2021

## 2021-05-26 NOTE — Progress Notes (Signed)
Request from [facility] for an rx for a 90 day supply

## 2021-06-01 NOTE — Pre-Procedure Instructions (Signed)
Surgical Instructions    Your procedure is scheduled on Wednesday, June 22nd.  Report to [facility] Main Entrance "A" at 8:30 A.M., then check in with the Admitting office.  Call this number if you have problems the morning of surgery:  [phone]   If you have any questions prior to your surgery date call [phone]: Open Monday-Friday 8am-4pm    Remember:  Do not eat after midnight the night before your surgery  You may drink clear liquids until 7:30 a.m. the morning of your surgery.   Clear liquids allowed are: Water, Non-Citrus Juices (without pulp), Carbonated Beverages, Clear Tea, Black Coffee Only, and Gatorade    Take these medicines the morning of surgery with A SIP OF WATER ARIPiprazole (ABILIFY)  gabapentin (NEURONTIN)  venlafaxine XR (EFFEXOR-XR)   Take these AS NEEDED: Albuterol inhaler-please bring with you on the day of surgery. hydrOXYzine (VISTARIL) LORazepam (ATIVAN) ondansetron (ZOFRAN ODT)   As of today, STOP taking any Aspirin (unless otherwise instructed by your surgeon) Aleve, Naproxen, Ibuprofen, Motrin, Advil, Goody's, BC's, all herbal medications, fish oil, and all vitamins.                     Do NOT Smoke (Tobacco/Vaping) or drink Alcohol 24 hours prior to your procedure.  If you use a CPAP at night, you may bring all equipment for your overnight stay.   Contacts, glasses, piercing's, hearing aid's, dentures or partials may not be worn into surgery, please bring cases for these belongings.    For patients admitted to the hospital, discharge time will be determined by your treatment team.   Patients discharged the day of surgery will not be allowed to drive home, and someone needs to stay with them for 24 hours.    Special instructions:   [facility]- Preparing For Surgery  Before surgery, you can play an important role. Because skin is not sterile, your skin needs to be as free of germs as possible. You can reduce the number of germs on  your skin by washing with CHG (chlorahexidine gluconate) Soap before surgery.  CHG is an antiseptic cleaner which kills germs and bonds with the skin to continue killing germs even after washing.    Oral Hygiene is also important to reduce your risk of infection.  Remember - BRUSH YOUR TEETH THE MORNING OF SURGERY WITH YOUR REGULAR TOOTHPASTE  Please do not use if you have an allergy to CHG or antibacterial soaps. If your skin becomes reddened/irritated stop using the CHG.  Do not shave (including legs and underarms) for at least 48 hours prior to first CHG shower. It is OK to shave your face.  Please follow these instructions carefully.   Shower the NIGHT BEFORE SURGERY and the MORNING OF SURGERY  If you chose to wash your hair, wash your hair first as usual with your normal shampoo.  After you shampoo, rinse your hair and body thoroughly to remove the shampoo.  Use CHG Soap as you would any other liquid soap. You can apply CHG directly to the skin and wash gently with a scrungie or a clean washcloth.   Apply the CHG Soap to your body ONLY FROM THE NECK DOWN.  Do not use on open wounds or open sores. Avoid contact with your eyes, ears, mouth and genitals (private parts). Wash Face and genitals (private parts)  with your normal soap.   Wash thoroughly, paying special attention to the area where your surgery will be performed.  Thoroughly rinse your body with warm water from the neck down.  DO NOT shower/wash with your normal soap after using and rinsing off the CHG Soap.  Pat yourself dry with a CLEAN TOWEL.  Wear CLEAN PAJAMAS to bed the night before surgery  Place CLEAN SHEETS on your bed the night before your surgery  DO NOT SLEEP WITH PETS.   Day of Surgery: Shower with CHG soap. Do not wear jewelry, make up, or nail polish. Do not wear lotions, powders, perfumes, or deodorant. Do not shave 48 hours prior to surgery.  Do not bring valuables to the hospital. [facility] is  not responsible for any belongings or valuables. Wear Clean/Comfortable clothing the morning of surgery Remember to brush your teeth WITH YOUR REGULAR TOOTHPASTE.   Please read over the following fact sheets that you were given.

## 2021-06-02 ENCOUNTER — Encounter (HOSPITAL_COMMUNITY): Payer: Self-pay

## 2021-06-02 ENCOUNTER — Other Ambulatory Visit: Payer: Self-pay

## 2021-06-02 ENCOUNTER — Encounter (HOSPITAL_COMMUNITY)
Admission: RE | Admit: 2021-06-02 | Discharge: 2021-06-02 | Disposition: A | Discharge status: Home / Self Care | Payer: 59 | Source: Ambulatory Visit | Attending: General Surgery | Admitting: General Surgery

## 2021-06-02 DIAGNOSIS — Z01818 Encounter for other preprocedural examination: Secondary | ICD-10-CM | POA: Diagnosis not present

## 2021-06-02 LAB — COMPREHENSIVE METABOLIC PANEL
ALT: 11 U/L (ref 0–44)
AST: 16 U/L (ref 15–41)
Albumin: 3.9 g/dL (ref 3.5–5.0)
Alkaline Phosphatase: 57 U/L (ref 38–126)
Anion gap: 9 (ref 5–15)
BUN: <5 mg/dL — ABNORMAL LOW (ref 6–20)
CO2: 27 mmol/L (ref 22–32)
Calcium: 8.8 mg/dL — ABNORMAL LOW (ref 8.9–10.3)
Chloride: 100 mmol/L (ref 98–111)
Creatinine, Ser: 0.76 mg/dL (ref 0.44–1.00)
GFR, Estimated: >60 mL/min (ref >60)
Glucose, Bld: 84 mg/dL (ref 70–99)
Potassium: 3 mmol/L — ABNORMAL LOW (ref 3.5–5.1)
Sodium: 136 mmol/L (ref 135–145)
Total Bilirubin: 0.6 mg/dL (ref 0.3–1.2)
Total Protein: 7.4 g/dL (ref 6.5–8.1)

## 2021-06-02 LAB — CBC
HCT: 42.2 % (ref 36.0–46.0)
Hemoglobin: 13.7 g/dL (ref 12.0–15.0)
MCH: 30.9 pg (ref 26.0–34.0)
MCHC: 32.5 g/dL (ref 30.0–36.0)
MCV: 95 fL (ref 80.0–100.0)
Platelets: 234 10*3/uL (ref 150–400)
RBC: 4.44 MIL/uL (ref 3.87–5.11)
RDW: 12.6 % (ref 11.5–15.5)
WBC: 7.5 10*3/uL (ref 4.0–10.5)
nRBC: 0 % (ref 0.0–0.2)

## 2021-06-02 LAB — POCT PREGNANCY, URINE: Preg Test, Ur: NEGATIVE

## 2021-06-02 NOTE — Progress Notes (Signed)
PCP - Dr. Welton Flakes Cardiologist - denies  Chest x-ray - n/a EKG - 06/02/21 Stress Test -  ECHO -  Cardiac Cath -   Sleep Study - yes CPAP - no    ERAS Protcol - yes PRE-SURGERY Ensure or G2- no drink   COVID TEST- n/a   Anesthesia review: yes   Patient denies shortness of breath, fever, cough and chest pain at PAT appointment   All instructions explained to the patient, with a verbal understanding of the material. Patient agrees to go over the instructions while at home for a better understanding. Patient also instructed to self quarantine after being tested for COVID-19. The opportunity to ask questions was provided.

## 2021-06-02 NOTE — Progress Notes (Signed)
Pt K+ in PAT 3.0, anesthesia notified, per Dr. Chilton Si repeat [id number] on DOS.

## 2021-06-09 ENCOUNTER — Other Ambulatory Visit: Payer: Self-pay

## 2021-06-09 ENCOUNTER — Ambulatory Visit
Admission: RE | Admit: 2021-06-09 | Discharge: 2021-06-09 | Disposition: A | Discharge status: Home / Self Care | Payer: 59 | Source: Ambulatory Visit | Attending: General Surgery | Admitting: General Surgery

## 2021-06-09 DIAGNOSIS — N6031 Fibrosclerosis of right breast: Secondary | ICD-10-CM

## 2021-06-10 ENCOUNTER — Ambulatory Visit
Admission: RE | Admit: 2021-06-10 | Discharge: 2021-06-10 | Disposition: A | Discharge status: Home / Self Care | Payer: 59 | Source: Ambulatory Visit | Attending: General Surgery | Admitting: General Surgery

## 2021-06-10 ENCOUNTER — Ambulatory Visit (HOSPITAL_COMMUNITY): Payer: 59

## 2021-06-10 ENCOUNTER — Ambulatory Visit (HOSPITAL_COMMUNITY)
Admission: RE | Admit: 2021-06-10 | Discharge: 2021-06-10 | Disposition: A | Discharge status: Home / Self Care | Payer: 59 | Attending: General Surgery | Admitting: General Surgery

## 2021-06-10 ENCOUNTER — Ambulatory Visit (HOSPITAL_COMMUNITY): Payer: 59 | Admitting: Physician Assistant

## 2021-06-10 ENCOUNTER — Encounter (HOSPITAL_COMMUNITY)
Admission: RE | Disposition: A | Discharge status: Home / Self Care | Payer: Self-pay | Source: Home / Self Care | Attending: General Surgery

## 2021-06-10 ENCOUNTER — Other Ambulatory Visit: Payer: Self-pay

## 2021-06-10 ENCOUNTER — Encounter (HOSPITAL_COMMUNITY): Payer: Self-pay | Admitting: General Surgery

## 2021-06-10 DIAGNOSIS — Z881 Allergy status to other antibiotic agents status: Secondary | ICD-10-CM | POA: Diagnosis not present

## 2021-06-10 DIAGNOSIS — Z79899 Other long term (current) drug therapy: Secondary | ICD-10-CM | POA: Diagnosis not present

## 2021-06-10 DIAGNOSIS — Z91048 Other nonmedicinal substance allergy status: Secondary | ICD-10-CM | POA: Insufficient documentation

## 2021-06-10 DIAGNOSIS — F17200 Nicotine dependence, unspecified, uncomplicated: Secondary | ICD-10-CM | POA: Diagnosis not present

## 2021-06-10 DIAGNOSIS — F419 Anxiety disorder, unspecified: Secondary | ICD-10-CM | POA: Insufficient documentation

## 2021-06-10 DIAGNOSIS — N6489 Other specified disorders of breast: Secondary | ICD-10-CM | POA: Insufficient documentation

## 2021-06-10 DIAGNOSIS — N63 Unspecified lump in unspecified breast: Secondary | ICD-10-CM | POA: Diagnosis present

## 2021-06-10 DIAGNOSIS — N6031 Fibrosclerosis of right breast: Secondary | ICD-10-CM

## 2021-06-10 HISTORY — PX: BREAST LUMPECTOMY WITH RADIOACTIVE SEED LOCALIZATION: SHX6424

## 2021-06-10 LAB — POCT I-STAT, CHEM 8
BUN: 5 mg/dL — ABNORMAL LOW (ref 6–20)
Calcium, Ion: 1.13 mmol/L — ABNORMAL LOW (ref 1.15–1.40)
Chloride: 102 mmol/L (ref 98–111)
Creatinine, Ser: 0.6 mg/dL (ref 0.44–1.00)
Glucose, Bld: 75 mg/dL (ref 70–99)
HCT: 39 % (ref 36.0–46.0)
Hemoglobin: 13.3 g/dL (ref 12.0–15.0)
Potassium: 3 mmol/L — ABNORMAL LOW (ref 3.5–5.1)
Sodium: 139 mmol/L (ref 135–145)
TCO2: 26 mmol/L (ref 22–32)

## 2021-06-10 SURGERY — BREAST LUMPECTOMY WITH RADIOACTIVE SEED LOCALIZATION
Anesthesia: General | Site: Breast | Laterality: Right

## 2021-06-10 MED ORDER — CELECOXIB 200 MG PO CAPS: 200.0000 mg | ORAL_CAPSULE | ORAL | Status: AC

## 2021-06-10 MED ORDER — MIDAZOLAM HCL 2 MG/2ML IJ SOLN: 0.5000 mg | Freq: Once | INTRAMUSCULAR | Status: DC | PRN

## 2021-06-10 MED ORDER — CEFAZOLIN SODIUM-DEXTROSE 2-4 GM/100ML-% IV SOLN
2.0000 g | INTRAVENOUS | Status: AC
  Administered 2021-06-10: 2 g via INTRAVENOUS

## 2021-06-10 MED ORDER — GABAPENTIN 300 MG PO CAPS
ORAL_CAPSULE | ORAL | Status: AC
  Filled 2021-06-10: qty 1

## 2021-06-10 MED ORDER — ORAL CARE MOUTH RINSE: 15.0000 mL | Freq: Once | OROMUCOSAL | Status: AC

## 2021-06-10 MED ORDER — MEPERIDINE HCL 25 MG/ML IJ SOLN: 6.2500 mg | INTRAMUSCULAR | Status: DC | PRN

## 2021-06-10 MED ORDER — FENTANYL CITRATE (PF) 250 MCG/5ML IJ SOLN
INTRAMUSCULAR | Status: AC
  Filled 2021-06-10: qty 5

## 2021-06-10 MED ORDER — MIDAZOLAM HCL 2 MG/2ML IJ SOLN
INTRAMUSCULAR | Status: DC | PRN
  Administered 2021-06-10: 2 mg via INTRAVENOUS

## 2021-06-10 MED ORDER — DEXAMETHASONE SODIUM PHOSPHATE 10 MG/ML IJ SOLN
INTRAMUSCULAR | Status: DC | PRN
  Administered 2021-06-10: 5 mg via INTRAVENOUS

## 2021-06-10 MED ORDER — ACETAMINOPHEN 500 MG PO TABS: 1000.0000 mg | ORAL_TABLET | ORAL | Status: AC

## 2021-06-10 MED ORDER — OXYCODONE HCL 5 MG PO TABS: 5.0000 mg | ORAL_TABLET | Freq: Once | ORAL | Status: DC | PRN

## 2021-06-10 MED ORDER — OXYCODONE HCL 5 MG/5ML PO SOLN: 5.0000 mg | Freq: Once | ORAL | Status: DC | PRN

## 2021-06-10 MED ORDER — ACETAMINOPHEN 500 MG PO TABS
ORAL_TABLET | ORAL | Status: AC
  Administered 2021-06-10: 1000 mg via ORAL
  Filled 2021-06-10: qty 2

## 2021-06-10 MED ORDER — CEFAZOLIN SODIUM-DEXTROSE 2-4 GM/100ML-% IV SOLN
INTRAVENOUS | Status: AC
  Filled 2021-06-10: qty 100

## 2021-06-10 MED ORDER — LACTATED RINGERS IV SOLN: INTRAVENOUS | Status: DC

## 2021-06-10 MED ORDER — BUPIVACAINE-EPINEPHRINE (PF) 0.25% -1:200000 IJ SOLN
INTRAMUSCULAR | Status: AC
  Filled 2021-06-10: qty 30

## 2021-06-10 MED ORDER — CHLORHEXIDINE GLUCONATE 0.12 % MT SOLN: 15.0000 mL | Freq: Once | OROMUCOSAL | Status: AC

## 2021-06-10 MED ORDER — CHLORHEXIDINE GLUCONATE 0.12 % MT SOLN
OROMUCOSAL | Status: AC
  Administered 2021-06-10: 15 mL via OROMUCOSAL
  Filled 2021-06-10: qty 15

## 2021-06-10 MED ORDER — CHLORHEXIDINE GLUCONATE CLOTH 2 % EX PADS: 6.0000 | MEDICATED_PAD | Freq: Once | CUTANEOUS | Status: DC

## 2021-06-10 MED ORDER — HYDROCODONE-ACETAMINOPHEN 5-325 MG PO TABS: 1.0000 | ORAL_TABLET | Freq: Four times a day (QID) | ORAL | 0 refills | Status: DC | PRN

## 2021-06-10 MED ORDER — PROPOFOL 10 MG/ML IV BOLUS
INTRAVENOUS | Status: AC
  Filled 2021-06-10: qty 20

## 2021-06-10 MED ORDER — MIDAZOLAM HCL 2 MG/2ML IJ SOLN
INTRAMUSCULAR | Status: AC
  Filled 2021-06-10: qty 2

## 2021-06-10 MED ORDER — PROPOFOL 10 MG/ML IV BOLUS
INTRAVENOUS | Status: DC | PRN
  Administered 2021-06-10: 200 mg via INTRAVENOUS

## 2021-06-10 MED ORDER — HYDROMORPHONE HCL 1 MG/ML IJ SOLN: 0.2500 mg | INTRAMUSCULAR | Status: DC | PRN

## 2021-06-10 MED ORDER — GABAPENTIN 300 MG PO CAPS: 300.0000 mg | ORAL_CAPSULE | ORAL | Status: DC

## 2021-06-10 MED ORDER — ONDANSETRON HCL 4 MG/2ML IJ SOLN
INTRAMUSCULAR | Status: DC | PRN
  Administered 2021-06-10: 4 mg via INTRAVENOUS

## 2021-06-10 MED ORDER — PROMETHAZINE HCL 25 MG/ML IJ SOLN: 6.2500 mg | INTRAMUSCULAR | Status: DC | PRN

## 2021-06-10 MED ORDER — CELECOXIB 200 MG PO CAPS
ORAL_CAPSULE | ORAL | Status: AC
  Administered 2021-06-10: 200 mg via ORAL
  Filled 2021-06-10: qty 1

## 2021-06-10 MED ORDER — FENTANYL CITRATE (PF) 250 MCG/5ML IJ SOLN
INTRAMUSCULAR | Status: DC | PRN
  Administered 2021-06-10: 50 ug via INTRAVENOUS

## 2021-06-10 MED ORDER — LIDOCAINE 2% (20 MG/ML) 5 ML SYRINGE
INTRAMUSCULAR | Status: DC | PRN
  Administered 2021-06-10: 60 mg via INTRAVENOUS

## 2021-06-10 MED ORDER — BUPIVACAINE-EPINEPHRINE 0.25% -1:200000 IJ SOLN
INTRAMUSCULAR | Status: DC | PRN
  Administered 2021-06-10: 17 mL

## 2021-06-10 SURGICAL SUPPLY — 32 items
APPLIER CLIP 9.375 MED OPEN (MISCELLANEOUS)
BINDER BREAST LRG (GAUZE/BANDAGES/DRESSINGS) IMPLANT
BINDER BREAST MEDIUM (GAUZE/BANDAGES/DRESSINGS) ×2 IMPLANT
BINDER BREAST XLRG (GAUZE/BANDAGES/DRESSINGS) IMPLANT
CANISTER SUCT 3000ML PPV (MISCELLANEOUS) ×2 IMPLANT
CHLORAPREP W/TINT 26 (MISCELLANEOUS) ×2 IMPLANT
CLIP APPLIE 9.375 MED OPEN (MISCELLANEOUS) IMPLANT
COVER PROBE W GEL 5X96 (DRAPES) ×2 IMPLANT
COVER SURGICAL LIGHT HANDLE (MISCELLANEOUS) ×2 IMPLANT
COVER WAND RF STERILE (DRAPES) IMPLANT
DERMABOND ADVANCED (GAUZE/BANDAGES/DRESSINGS) ×1
DERMABOND ADVANCED .7 DNX12 (GAUZE/BANDAGES/DRESSINGS) ×1 IMPLANT
DEVICE DUBIN SPECIMEN MAMMOGRA (MISCELLANEOUS) ×2 IMPLANT
DRAPE CHEST BREAST 15X10 FENES (DRAPES) ×2 IMPLANT
ELECT COATED BLADE 2.86 ST (ELECTRODE) ×2 IMPLANT
ELECT REM PT RETURN 9FT ADLT (ELECTROSURGICAL) ×2
ELECTRODE REM PT RTRN 9FT ADLT (ELECTROSURGICAL) ×1 IMPLANT
GLOVE SURG ENC MOIS LTX SZ7.5 (GLOVE) ×4 IMPLANT
GOWN STRL REUS W/ TWL LRG LVL3 (GOWN DISPOSABLE) ×2 IMPLANT
GOWN STRL REUS W/TWL LRG LVL3 (GOWN DISPOSABLE) ×2
KIT BASIN OR (CUSTOM PROCEDURE TRAY) ×2 IMPLANT
KIT MARKER MARGIN INK (KITS) ×2 IMPLANT
LIGHT WAVEGUIDE WIDE FLAT (MISCELLANEOUS) IMPLANT
NEEDLE HYPO 25GX1X1/2 BEV (NEEDLE) ×2 IMPLANT
NS IRRIG 1000ML POUR BTL (IV SOLUTION) ×2 IMPLANT
PACK GENERAL/GYN (CUSTOM PROCEDURE TRAY) ×2 IMPLANT
SUT MNCRL AB 4-0 PS2 18 (SUTURE) ×2 IMPLANT
SUT SILK 2 0 SH (SUTURE) IMPLANT
SUT VIC AB 3-0 SH 18 (SUTURE) ×2 IMPLANT
SYR CONTROL 10ML LL (SYRINGE) ×2 IMPLANT
TOWEL GREEN STERILE (TOWEL DISPOSABLE) ×2 IMPLANT
TOWEL GREEN STERILE FF (TOWEL DISPOSABLE) ×2 IMPLANT

## 2021-06-10 NOTE — Interval H&P Note (Signed)
History and Physical Interval Note:  06/10/2021 9:55 AM  Kristy Shaw  has presented today for surgery, with the diagnosis of RIGHT BREAST DISCORDINANCE.  The various methods of treatment have been discussed with the patient and family. After consideration of risks, benefits and other options for treatment, the patient has consented to  Procedure(s): RIGHT BREAST LUMPECTOMY WITH RADIOACTIVE SEED LOCALIZATION (Right) as a surgical intervention.  The patient's history has been reviewed, patient examined, no change in status, stable for surgery.  I have reviewed the patient's chart and labs.  Questions were answered to the patient's satisfaction.     [name]

## 2021-06-10 NOTE — Op Note (Signed)
06/10/2021  10:57 AM  PATIENT:  Kristy Shaw  45 y.o. female  PRE-OPERATIVE DIAGNOSIS:  RIGHT BREAST DISCORDANCE  POST-OPERATIVE DIAGNOSIS:  RIGHT BREAST DISCORDANCE  PROCEDURE:  Procedure(s): RIGHT BREAST LUMPECTOMY WITH RADIOACTIVE SEED LOCALIZATION (Right)  SURGEON:  Surgeon(s) and Role:    * Griselda Miner, MD - Primary  PHYSICIAN ASSISTANT:   ASSISTANTS: none   ANESTHESIA:   local and general  EBL:  10 mL   BLOOD ADMINISTERED:none  DRAINS: none   LOCAL MEDICATIONS USED:  MARCAINE     SPECIMEN:  Source of Specimen:  right breast tissue  DISPOSITION OF SPECIMEN:  PATHOLOGY  COUNTS:  YES  TOURNIQUET:  * No tourniquets in log *  DICTATION: .Dragon Dictation  After informed consent was obtained the patient was brought to the operating room and placed in the supine position on the operating table.  After adequate induction of general anesthesia the patient's right breast was prepped with ChloraPrep, allowed to dry, and draped in usual sterile manner.  An appropriate timeout was performed.  Previously an I-125 seed was placed in the upper portion of the right breast to mark an area of discordance.  The neoprobe was set to I-125 in the area of radioactivity was readily identified.  The area around this was infiltrated with quarter percent Marcaine.  A curvilinear incision was made along the upper edge of the areola of the right breast with a 15 blade knife.  The incision was carried through the skin and subcutaneous tissue sharply with the electrocautery.  Dissection was then carried superiorly between the breast tissue and the subcutaneous fat and skin sharply with the electrocautery until the dissection was beyond the area of the radioactive seed.  I then removed a circular portion of breast tissue sharply with the electrocautery around the radioactive seed while checking the area of radioactivity frequently.  Once the specimen was removed it was oriented with the  appropriate paint colors.  A specimen radiograph was obtained that showed the clip and seed to be within the specimen.  The specimen was then sent to pathology for further evaluation.  Hemostasis was achieved using the Bovie electrocautery.  The wound was irrigated with saline and infiltrated with more quarter percent Marcaine.  The deep layer of the wound was closed with interrupted 3-0 Vicryl stitches.  The skin was closed with interrupted 4-0 Monocryl subcuticular stitches.  Dermabond dressings were applied.  The patient tolerated the procedure well.  At the end of the case all needle sponge and instrument counts were correct.  The patient was then awakened and taken to recovery in stable condition.  PLAN OF CARE: Discharge to home after PACU  PATIENT DISPOSITION:  PACU - hemodynamically stable.   Delay start of Pharmacological VTE agent (>24hrs) due to surgical blood loss or risk of bleeding: not applicable

## 2021-06-10 NOTE — Anesthesia Procedure Notes (Signed)
Procedure Name: LMA Insertion Date/Time: 06/10/2021 10:15 AM Performed by: Drema Pry, CRNA Pre-anesthesia Checklist: Patient identified, Emergency Drugs available, Suction available and Patient being monitored Patient Re-evaluated:Patient Re-evaluated prior to induction Oxygen Delivery Method: Circle System Utilized Preoxygenation: Pre-oxygenation with 100% oxygen Induction Type: IV induction Ventilation: Mask ventilation without difficulty LMA: LMA inserted LMA Size: 3.0 Number of attempts: 1 Airway Equipment and Method: Bite block Placement Confirmation: positive ETCO2 Tube secured with: Tape Dental Injury: Teeth and Oropharynx as per pre-operative assessment

## 2021-06-10 NOTE — H&P (Signed)
Kristy Shaw  Location: [facility] Patient #: [id number] DOB: 03/24/1976 Married / Language: English / Race: White Female   History of Present Illness  The patient is a 45 year old female who presents for a follow-up for Breast mass. The patient is a 45 year old white female who recently went for a routine mammogram.  At that time she was found to have an area of distortion in the upper inner quadrant of the right breast.  This was biopsied and came back with all benign tissue.  The radiologist felt that this was discordant and recommended excision of the area.  She denies any breast pain or discharge from the nipple.  She does have significant anxiety.   Allergies  Doxycycline *DERMATOLOGICALS*   Rash. Adhesive Tape 1"x5yd *MEDICAL DEVICES AND SUPPLIES*   Allergies Reconciled    Medication History  ALPRAZolam  (0.5MG  Tablet, Oral) Active. Gabapentin  (400MG  Capsule, Oral) Active. Venlafaxine HCl ER  (75MG  Capsule ER 24HR, Oral) Active. Albuterol Sulfate HFA  (108MCG/ACT Aerosol Soln, Inhalation as needed) Active. Venlafaxine HCl ER  (150MG  Capsule ER 24HR, Oral) Active. Medications Reconciled     Review of Systems  General Present- Fatigue. Not Present- Appetite Loss, Chills, Fever, Night Sweats, Weight Gain and Weight Loss. Skin Present- Dryness. Not Present- Change in Wart/Mole, Hives, Jaundice, New Lesions, Non-Healing Wounds, Rash and Ulcer. HEENT Present- Earache, Hoarseness, Seasonal Allergies and Wears glasses/contact lenses. Not Present- Hearing Loss, Nose Bleed, Oral Ulcers, Ringing in the Ears, Sinus Pain, Sore Throat, Visual Disturbances and Yellow Eyes. Respiratory Present- Chronic Cough and Wheezing. Not Present- Bloody sputum, Difficulty Breathing and Snoring. Breast Not Present- Breast Mass, Breast Pain, Nipple Discharge and Skin Changes. Cardiovascular Not Present- Chest Pain, Difficulty Breathing Lying Down, Leg Cramps, Palpitations, Rapid Heart  Rate, Shortness of Breath and Swelling of Extremities. Gastrointestinal Present- Hemorrhoids. Not Present- Abdominal Pain, Bloating, Bloody Stool, Change in Bowel Habits, Chronic diarrhea, Constipation, Difficulty Swallowing, Excessive gas, Gets full quickly at meals, Indigestion, Nausea, Rectal Pain and Vomiting. Female Genitourinary Present- Frequency and Urgency. Not Present- Nocturia, Painful Urination and Pelvic Pain. Musculoskeletal Present- Back Pain, Joint Pain and Muscle Pain. Not Present- Joint Stiffness, Muscle Weakness and Swelling of Extremities. Neurological Not Present- Decreased Memory, Fainting, Headaches, Numbness, Seizures, Tingling, Tremor, Trouble walking and Weakness. Psychiatric Present- Anxiety and Depression. Not Present- Bipolar, Change in Sleep Pattern, Fearful and Frequent crying. Endocrine Present- Cold Intolerance. Not Present- Excessive Hunger, Hair Changes, Heat Intolerance, Hot flashes and New Diabetes. Hematology Not Present- Easy Bruising, Excessive bleeding, Gland problems, HIV and Persistent Infections.  Vitals Weight: 134.13 lb   Height: 69 in  Body Surface Area: 1.74 m   Body Mass Index: 19.81 kg/m   Temp.: 98.4 F    Pulse: 86 (Regular)    P.OX: 97% (Room air) BP: 118/60(Sitting, Left Arm, Standard)       Physical Exam  General Mental Status - Alert. General Appearance - Consistent with stated age. Hydration - Well hydrated. Voice - Normal.  Head and Neck Head - normocephalic, atraumatic with no lesions or palpable masses. Trachea - midline. Thyroid Gland Characteristics - normal size and consistency.  Eye Eyeball - Bilateral - Extraocular movements intact. Sclera/Conjunctiva - Bilateral - No scleral icterus.  Chest and Lung Exam Chest and lung exam reveals  - quiet, even and easy respiratory effort with no use of accessory muscles and on auscultation, normal breath sounds, no adventitious sounds and normal vocal  resonance. Inspection Chest Wall - Normal. Back -  normal.  Breast Note:  There is no palpable mass in either breast. There is no palpable axillary, subclavicular, or cervical lymphadenopathy.   Cardiovascular Cardiovascular examination reveals  - normal heart sounds, regular rate and rhythm with no murmurs and normal pedal pulses bilaterally.  Abdomen Inspection Inspection of the abdomen reveals - No Hernias. Skin - Scar - no surgical scars. Palpation/Percussion Palpation and Percussion of the abdomen reveal - Soft, Non Tender, No Rebound tenderness, No Rigidity (guarding) and No hepatosplenomegaly. Auscultation Auscultation of the abdomen reveals - Bowel sounds normal.  Neurologic Neurologic evaluation reveals  - alert and oriented x 3 with no impairment of recent or remote memory. Mental Status - Normal.  Musculoskeletal Normal Exam - Left - Upper Extremity Strength Normal and Lower Extremity Strength Normal. Normal Exam - Right - Upper Extremity Strength Normal and Lower Extremity Strength Normal.  Lymphatic Head & Neck  General Head & Neck Lymphatics: Bilateral - Description - Normal. Axillary  General Axillary Region: Bilateral - Description - Normal. Tenderness - Non Tender. Femoral & Inguinal  Generalized Femoral & Inguinal Lymphatics: Bilateral - Description - Normal. Tenderness - Non Tender.    Assessment & Plan  FIBROSIS, BREAST, RIGHT (N60.31) Impression: The patient appears to have an area of distortion in the upper inner quadrant of the right breast that appeared abnormal. This was biopsied and the tissue all came back benign. The radiologist felt that this was discordant and recommended excision. I have explained that there is about a 5-10% chance of missing something more significant. She will be a good candidate for right breast radioactive seed localized lumpectomy. I have discussed with her in detail the risks and benefits of the operation as well as some  of the technical aspects and she understands and wishes to proceed. This patient encounter took 30 minutes today to perform the following: take history, perform exam, review outside records, interpret imaging, counsel the patient on their diagnosis and document encounter, findings & plan in the EHR

## 2021-06-10 NOTE — Transfer of Care (Signed)
Immediate Anesthesia Transfer of Care Note  Patient: Kristy Shaw  Procedure(s) Performed: RIGHT BREAST LUMPECTOMY WITH RADIOACTIVE SEED LOCALIZATION (Right: Breast)  Patient Location: PACU  Anesthesia Type:General  Level of Consciousness: drowsy, patient cooperative and responds to stimulation  Airway & Oxygen Therapy: Patient Spontanous Breathing  Post-op Assessment: Report given to RN and Post -op Vital signs reviewed and stable  Post vital signs: Reviewed and stable  Last Vitals:  Vitals Value Taken Time  BP 111/67 06/10/21 1104  Temp    Pulse 68 06/10/21 1105  Resp 19 06/10/21 1105  SpO2 93 % 06/10/21 1105  Vitals shown include unvalidated device data.  Last Pain:  Vitals:   06/10/21 0913  TempSrc:   PainSc: 0-No pain      Patients Stated Pain Goal: 2 (06/10/21 0913)  Complications: No notable events documented.

## 2021-06-10 NOTE — Anesthesia Postprocedure Evaluation (Signed)
Anesthesia Post Note  Patient: Kristy Shaw  Procedure(s) Performed: RIGHT BREAST LUMPECTOMY WITH RADIOACTIVE SEED LOCALIZATION (Right: Breast)     Patient location during evaluation: PACU Anesthesia Type: General Level of consciousness: awake and alert, patient cooperative and oriented Pain management: pain level controlled Vital Signs Assessment: post-procedure vital signs reviewed and stable Respiratory status: spontaneous breathing, nonlabored ventilation and respiratory function stable Cardiovascular status: blood pressure returned to baseline and stable Postop Assessment: no apparent nausea or vomiting Anesthetic complications: no   No notable events documented.  Last Vitals:  Vitals:   06/10/21 1120 06/10/21 1135  BP: 98/60 99/65  Pulse: 64 66  Resp: 13 12  Temp:  36.6 C  SpO2: 100% 100%    Last Pain:  Vitals:   06/10/21 1135  TempSrc:   PainSc: 0-No pain                 Regina Coppolino,E. Hubbert Landrigan

## 2021-06-10 NOTE — Anesthesia Preprocedure Evaluation (Addendum)
Anesthesia Evaluation  Patient identified by MRN, date of birth, ID band Patient awake    Reviewed: Allergy & Precautions, NPO status , Patient's Chart, lab work & pertinent test results  History of Anesthesia Complications Negative for: history of anesthetic complications  Airway Mallampati: I  TM Distance: >3 FB Neck ROM: Full    Dental  (+) Dental Advisory Given   Pulmonary asthma , sleep apnea and Continuous Positive Airway Pressure Ventilation , COPD,  COPD inhaler, Current SmokerPatient did not abstain from smoking.,    breath sounds clear to auscultation       Cardiovascular negative cardio ROS   Rhythm:Regular Rate:Normal     Neuro/Psych Anxiety Depression negative neurological ROS     GI/Hepatic Neg liver ROS, GERD  Medicated and Controlled,  Endo/Other  negative endocrine ROS  Renal/GU negative Renal ROS     Musculoskeletal  (+) Fibromyalgia - (gabapentin)  Abdominal   Peds  Hematology negative hematology ROS (+)   Anesthesia Other Findings   Reproductive/Obstetrics                            Anesthesia Physical Anesthesia Plan  ASA: 2  Anesthesia Plan: General   Post-op Pain Management:    Induction: Intravenous  PONV Risk Score and Plan: 2 and Ondansetron and Dexamethasone  Airway Management Planned: LMA  Additional Equipment: None  Intra-op Plan:   Post-operative Plan:   Informed Consent: I have reviewed the patients History and Physical, chart, labs and discussed the procedure including the risks, benefits and alternatives for the proposed anesthesia with the patient or authorized representative who has indicated his/her understanding and acceptance.     Dental advisory given  Plan Discussed with: CRNA and Surgeon  Anesthesia Plan Comments:        Anesthesia Quick Evaluation

## 2021-06-10 NOTE — Progress Notes (Signed)
Dr. Sandford Craze made aware that patient's potassium on today's iSTAT is 3.0. No new orders received.

## 2021-06-11 ENCOUNTER — Encounter (HOSPITAL_COMMUNITY): Payer: Self-pay | Admitting: General Surgery

## 2021-06-11 LAB — SURGICAL PATHOLOGY

## 2021-06-15 ENCOUNTER — Other Ambulatory Visit: Payer: Self-pay

## 2021-06-15 ENCOUNTER — Ambulatory Visit: Payer: 59 | Admitting: Dermatology

## 2021-06-15 DIAGNOSIS — L281 Prurigo nodularis: Secondary | ICD-10-CM | POA: Diagnosis not present

## 2021-06-15 DIAGNOSIS — L2081 Atopic neurodermatitis: Secondary | ICD-10-CM | POA: Diagnosis not present

## 2021-06-15 DIAGNOSIS — L720 Epidermal cyst: Secondary | ICD-10-CM

## 2021-06-15 NOTE — Patient Instructions (Signed)

## 2021-06-15 NOTE — Progress Notes (Signed)
   Follow-Up Visit   Subjective  Kristy Shaw is a 45 y.o. female who presents for the following: Dermatitis (Back, Dupixent injections q 2 wks, Opzelura cr ~1-2x/wk, Not using clobetasol/cerave, has had 1 flare, Hydroxyzine  25mg  1 po prn, much improved since starting dupixent) and check bump (R med canthus, recently noticed, picks at). And is bothersome.  No side effects from Dupixent injections.   The following portions of the chart were reviewed this encounter and updated as appropriate:        Review of Systems:  No other skin or systemic complaints except as noted in HPI or Assessment and Plan.  Objective  Well appearing patient in no apparent distress; mood and affect are within normal limits.  A focused examination was performed including back, R arm, L leg, face. Relevant physical exam findings are noted in the Assessment and Plan.  trunk, extremities Firm hyperpigmented macules and paps some with excoriations upper back, R shoulder, L lower leg, otherwise clear  upper back, R shoulder, L lower leg Firm hyperpigmented macules and paps some with excoriations upper back, R shoulder, L lower leg  R med canthus Firm small white papules   Assessment & Plan  Atopic neurodermatitis trunk, extremities  Overall improved  Atopic dermatitis - Severe, on Dupixent (biologic medication).  Atopic dermatitis (eczema) is a chronic, relapsing, pruritic condition that can significantly affect quality of life. It is often associated with allergic rhinitis and/or asthma and can require treatment with topical medications, phototherapy, or in severe cases a biologic medication called Dupixent.    Cont Dupixent 300mg /92ml sq injections q 2 wks Cont Opzelura cr qd/bid aa prn flares Cont Clobetasol/Cerave cr qd/bid aa body prn flares Cont Hydroxyzine 25mg  1 po hs prn  Prurigo nodularis upper back, R shoulder, L lower leg  Start Opzelura cr bid aa upper back, R arm, L lower leg Start  Clobetasol/Cerave cr bid to aa upper back, R arm, L lower leg  Avoid picking/scratching Discussed IL kenalog injections if topicals not helping  Milia R med canthus  Irritated Extraction x 2  Acne/Milia surgery - R med canthus Procedure risks and benefits were discussed with the patient and verbal consent was obtained. Following prep of the skin on the R medial canthus with an alcohol swab and local anesthesia injection, extraction of milia was performed with a comedone extractor following superficial incision made over their surfaces with a #11 surgical blade. Capillary hemostasis was achieved with pressure. Vaseline ointment was applied to each site. The patient tolerated the procedure well.  Return in about 1 month (around 07/15/2021) for Atopic Derm, prurigo nodules.  I, 3m, RMA, am acting as scribe for , MD .  Documentation: I have reviewed the above documentation for accuracy and completeness, and I agree with the above.  07/17/2021 MD

## 2021-07-01 ENCOUNTER — Telehealth: Payer: 59 | Admitting: Psychiatry

## 2021-07-06 ENCOUNTER — Ambulatory Visit (INDEPENDENT_AMBULATORY_CARE_PROVIDER_SITE_OTHER): Payer: 59 | Admitting: Licensed Clinical Social Worker

## 2021-07-06 ENCOUNTER — Other Ambulatory Visit: Payer: Self-pay

## 2021-07-06 DIAGNOSIS — F330 Major depressive disorder, recurrent, mild: Secondary | ICD-10-CM

## 2021-07-06 DIAGNOSIS — F418 Other specified anxiety disorders: Secondary | ICD-10-CM | POA: Diagnosis not present

## 2021-07-06 DIAGNOSIS — F17200 Nicotine dependence, unspecified, uncomplicated: Secondary | ICD-10-CM

## 2021-07-06 NOTE — Progress Notes (Signed)
Virtual Visit via Audio Note  I connected with Kristy Shaw on 07/06/21 at  2:00 PM EDT by an audio enabled telemedicine application and verified that I am speaking with the correct person using two identifiers.  Location: Patient: home/car Provider: remote office [place], [place])   I discussed the limitations of evaluation and management by telemedicine and the availability of in person appointments. The patient expressed understanding and agreed to proceed.   I discussed the assessment and treatment plan with the patient. The patient was provided an opportunity to ask questions and all were answered. The patient agreed with the plan and demonstrated an understanding of the instructions.   The patient was advised to call back or seek an in-person evaluation if the symptoms worsen or if the condition fails to improve as anticipated.  I provided 60 minutes of non-face-to-face time during this encounter.   Vonzella Althaus R Romona Murdy, LCSW  THERAPIST PROGRESS NOTE  Session Time: 2-3p  Participation Level: Active  Behavioral Response: Neat and Well GroomedAlertAnxious and Irritable  Type of Therapy: Individual Therapy  Treatment Goals addressed: Anxiety, Communication: conflict resolution, and Coping  Interventions: CBT, Motivational Interviewing, Supportive, and Anger Management Training  Summary: Kristy Shaw is a 45 y.o. female who presents with With improving symptoms associated with depression diagnosis. Patient reports that she is still having some mood swings, irritability, and episodes where she feels verbally aggressive with others. Reviewed emotion regulation techniques and anger management techniques. Patient reports that she does feel the Abilify is helping with overall irritability and anger. Patient reports higher levels of anxiety this session than in the past.   Allowed patient to explore and express thoughts and feelings associated with recent external stressors and  life events. Patient wanted to discuss relationship with husband, and several verbal conflict situations that have happened recently. Allowed patient to take a step back and look at her own perspective and look at her husband's perspective and try to understand why each of them were believing what they believed in the moment.   Allowed patient to explore her thoughts and feelings about relationship with her daughter when her daughter is particularly whiny and not listening. Reviewed behavior management--included identifying antecedents to behavior and reinforcing stimuli.   Allowed patient to explore relationships with friends and family members. Patient reports that she has made the decision to stop smoking and may use aids such as nicotine gum and nicotine patches.   Encouraged patient to call the Kristy Shaw for additional support and resources if needed. Reviewed stages of change with patient and helped identify which stage patient is currently in (action). .   Continued recommendations are as follows: self care behaviors, positive social engagements, focusing on overall work/home/life balance, and focusing on positive physical and emotional wellness.   Suicidal/Homicidal: No  Therapist Response: Kristy Shaw is continuing to reduce overall level, frequency, and intensity of anxiety that she is experiencing. Kristy Shaw is trying to stabilize her anxiety level while increasing her ability to function on a daily basis. Patient is able to identify and anxiety coping mechanism that has been successful in the past and increase its use. Patient is trying hard to reduce overall irritability and increase normal social interaction with family and friends. Patient is working hard to cut back on substance use (nicotine). these behaviors are reflective of overall personal growth and progress. Treatment to continue as indicated.  Plan: Return again in 4 weeks.  Diagnosis: Axis I: MDD, recurrent, mild, Anxiety NOS,  tobacco use  disorder    Axis II: No diagnosis    Ernest Haber Marnesha Gagen, LCSW 07/06/2021

## 2021-07-13 DIAGNOSIS — N6489 Other specified disorders of breast: Secondary | ICD-10-CM | POA: Insufficient documentation

## 2021-07-15 ENCOUNTER — Telehealth: Payer: Self-pay | Admitting: Hematology and Oncology

## 2021-07-15 NOTE — Telephone Encounter (Signed)
Received a referral from Dr. Carolynne Edouard for Ms. Streety to be seen in the high risk clinic. Ms. Tenny has been cld and scheduled to see Dr. Pamelia Hoit on 8/11 at 1pm. Pt aware to arrive 15 minutes early.

## 2021-07-16 ENCOUNTER — Other Ambulatory Visit: Payer: Self-pay

## 2021-07-16 ENCOUNTER — Encounter: Payer: Self-pay | Admitting: Psychiatry

## 2021-07-16 ENCOUNTER — Telehealth (INDEPENDENT_AMBULATORY_CARE_PROVIDER_SITE_OTHER): Payer: 59 | Admitting: Psychiatry

## 2021-07-16 DIAGNOSIS — Z9111 Patient's noncompliance with dietary regimen: Secondary | ICD-10-CM

## 2021-07-16 DIAGNOSIS — F330 Major depressive disorder, recurrent, mild: Secondary | ICD-10-CM | POA: Diagnosis not present

## 2021-07-16 DIAGNOSIS — F1590 Other stimulant use, unspecified, uncomplicated: Secondary | ICD-10-CM | POA: Diagnosis not present

## 2021-07-16 DIAGNOSIS — F17200 Nicotine dependence, unspecified, uncomplicated: Secondary | ICD-10-CM | POA: Diagnosis not present

## 2021-07-16 DIAGNOSIS — F418 Other specified anxiety disorders: Secondary | ICD-10-CM | POA: Diagnosis not present

## 2021-07-16 DIAGNOSIS — Z91199 Patient's noncompliance with other medical treatment and regimen due to unspecified reason: Secondary | ICD-10-CM

## 2021-07-16 MED ORDER — ARIPIPRAZOLE 5 MG PO TABS: 5.0000 mg | ORAL_TABLET | Freq: Every day | ORAL | 0 refills | Status: DC
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
## 2021-07-21 ENCOUNTER — Ambulatory Visit: Payer: 59 | Admitting: Dermatology

## 2021-07-21 ENCOUNTER — Other Ambulatory Visit: Payer: Self-pay

## 2021-07-21 DIAGNOSIS — S80861A Insect bite (nonvenomous), right lower leg, initial encounter: Secondary | ICD-10-CM

## 2021-07-21 DIAGNOSIS — L2081 Atopic neurodermatitis: Secondary | ICD-10-CM

## 2021-07-21 DIAGNOSIS — S30861A Insect bite (nonvenomous) of abdominal wall, initial encounter: Secondary | ICD-10-CM

## 2021-07-21 DIAGNOSIS — W57XXXA Bitten or stung by nonvenomous insect and other nonvenomous arthropods, initial encounter: Secondary | ICD-10-CM | POA: Diagnosis not present

## 2021-07-21 MED ORDER — CLOBETASOL PROPIONATE 0.05 % EX OINT: 1.0000 "application " | TOPICAL_OINTMENT | Freq: Two times a day (BID) | CUTANEOUS | 1 refills | Status: AC

## 2021-07-21 NOTE — Progress Notes (Signed)
   Follow-Up Visit   Subjective  Kristy Shaw is a 45 y.o. female who presents for the following: Follow-up (1 mo atopic derm/prurigo nodularis f/u. Pt treating with dupixent q2wks. She reports good results with it. Pt treats with opzelura, a clobetasol/cerave mixture, and hydroxizine prn flares. Pt states that she has an area on her left shoulder that is not clearing up. ).  Overall, she has had decreased itching on Dupixent, which she started in March this year. Pt also c/o 3 bug bites that she was given an antibiotic for in case they are tick bites.    The following portions of the chart were reviewed this encounter and updated as appropriate:      Review of Systems: No other skin or systemic complaints except as noted in HPI or Assessment and Plan.   Objective  Well appearing patient in no apparent distress; mood and affect are within normal limits.  A focused examination was performed including left shoulders, extremities, right flank, left inguinal crease. Relevant physical exam findings are noted in the Assessment and Plan.  trunk, extremities, left shoulder Hyperpigmented papules with excoriations left upper back and right upper arm     Right Flank, right popliteal, left inguinal crease Pink edematous papules, no surrounding erythema or rash.  Assessment & Plan  Atopic neurodermatitis trunk, extremities, left shoulder  With prurigo nodularis. Atopic dermatitis - Severe, on Dupixent (biologic medication). Improving. Atopic dermatitis (eczema) is a chronic, relapsing, pruritic condition that can significantly affect quality of life. It is often associated with allergic rhinitis and/or asthma and can require treatment with topical medications, phototherapy, or in severe cases a biologic medication called Dupixent.    Start Clobetasol oint. Apply BID to itchy raised areas. Cover with bandage. May stop when flattens out and area stops itching.  Avoid applying to face, groin,  and axilla. Use as directed. Risk of skin atrophy with long-term use reviewed.   Topical steroids (such as triamcinolone, fluocinolone, fluocinonide, mometasone, clobetasol, halobetasol, betamethasone, hydrocortisone) can cause thinning and lightening of the skin if they are used for too long in the same area. Your physician has selected the right strength medicine for your problem and area affected on the body. Please use your medication only as directed by your physician to prevent side effects.   Continue Dupixent q2wks.   Dupilumab (Dupixent) is a treatment given by injection for adults with moderate-to-severe atopic dermatitis. Goal is control of skin condition, not cure. It is given as 2 injections at the first dose followed by 1 injection ever 2 weeks thereafter.  Potential side effects include allergic reaction, herpes infections, injection site reactions and conjunctivitis (inflammation of the eyes).  The use of Dupixent requires long term medication management, including periodic office visits.   clobetasol ointment (TEMOVATE) 0.05 % - trunk, extremities, left shoulder Apply 1 application topically 2 (two) times daily.  Insect bite, unspecified site, initial encounter Right Flank, right popliteal, left inguinal crease  Healing- no evidence of infection May apply clobetasol ointment to bite areas qd/bid.  Continue antibiotics as prescribed by PCP.   Return in about 6 months (around 01/21/2022) for atopic derm/prurigo nodularis.  I, Epifania Gore, CMA, am acting as scribe for Willeen Niece, MD.  Documentation: I have reviewed the above documentation for accuracy and completeness, and I agree with the above.  Willeen Niece MD
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
## 2021-07-30 ENCOUNTER — Telehealth (INDEPENDENT_AMBULATORY_CARE_PROVIDER_SITE_OTHER): Payer: 59 | Admitting: Psychiatry

## 2021-07-30 ENCOUNTER — Other Ambulatory Visit: Payer: Self-pay

## 2021-07-30 ENCOUNTER — Encounter: Payer: Self-pay | Admitting: Psychiatry

## 2021-07-30 ENCOUNTER — Inpatient Hospital Stay: Payer: 59 | Attending: Hematology and Oncology | Admitting: Hematology and Oncology

## 2021-07-30 DIAGNOSIS — F17210 Nicotine dependence, cigarettes, uncomplicated: Secondary | ICD-10-CM | POA: Diagnosis not present

## 2021-07-30 DIAGNOSIS — F330 Major depressive disorder, recurrent, mild: Secondary | ICD-10-CM | POA: Diagnosis not present

## 2021-07-30 DIAGNOSIS — N6489 Other specified disorders of breast: Secondary | ICD-10-CM | POA: Insufficient documentation

## 2021-07-30 DIAGNOSIS — Z91199 Patient's noncompliance with other medical treatment and regimen due to unspecified reason: Secondary | ICD-10-CM

## 2021-07-30 DIAGNOSIS — F1590 Other stimulant use, unspecified, uncomplicated: Secondary | ICD-10-CM | POA: Diagnosis not present

## 2021-07-30 DIAGNOSIS — F418 Other specified anxiety disorders: Secondary | ICD-10-CM

## 2021-07-30 DIAGNOSIS — Z803 Family history of malignant neoplasm of breast: Secondary | ICD-10-CM | POA: Diagnosis not present

## 2021-07-30 DIAGNOSIS — Z634 Disappearance and death of family member: Secondary | ICD-10-CM

## 2021-07-30 DIAGNOSIS — F17200 Nicotine dependence, unspecified, uncomplicated: Secondary | ICD-10-CM | POA: Diagnosis not present

## 2021-07-30 DIAGNOSIS — Z9111 Patient's noncompliance with dietary regimen: Secondary | ICD-10-CM

## 2021-07-30 HISTORY — DX: Disappearance and death of family member: Z63.4

## 2021-07-30 MED ORDER — ARIPIPRAZOLE 5 MG PO TABS: 5.0000 mg | ORAL_TABLET | Freq: Every day | ORAL | 0 refills | Status: DC

## 2021-07-30 MED ORDER — TAMOXIFEN CITRATE 10 MG PO TABS: 5.0000 mg | ORAL_TABLET | Freq: Two times a day (BID) | ORAL | 1 refills | Status: DC
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
## 2021-07-30 NOTE — Assessment & Plan Note (Addendum)
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 Occasionally, radial scars are large enough to be detected on mammography as suspicious spiculated masses, which cannot be reliably differentiated from spiculated carcinoma by imaging alone. radial scars are excised because most case series show that 8 to 17 percent of surgical specimens at subsequent excision are positive for malignancy or DCIS.  Risk assessment: Tyrer-Cuzick 10-year risk: 6% (average risk 2.4%) Lifetime risk 27% (average risk 10.5%)  Recommendation: 1.  Tamoxifen to reduce the risk of breast cancer we discussed about different doses and based upon TAM-01 clinical trial we will start her at 5 mg a day. 2. patient stays very active and exercise regularly.  Eats fruits and vegetables and does not eat too much red meat.  Return to clinic in 3 months for follow-up on tamoxifen therapy with a MyChart virtual visit.

## 2021-08-10 ENCOUNTER — Other Ambulatory Visit: Payer: Self-pay

## 2021-08-10 ENCOUNTER — Ambulatory Visit (INDEPENDENT_AMBULATORY_CARE_PROVIDER_SITE_OTHER): Payer: 59 | Admitting: Licensed Clinical Social Worker

## 2021-08-10 DIAGNOSIS — Z634 Disappearance and death of family member: Secondary | ICD-10-CM

## 2021-08-10 DIAGNOSIS — F330 Major depressive disorder, recurrent, mild: Secondary | ICD-10-CM

## 2021-08-10 DIAGNOSIS — L2089 Other atopic dermatitis: Secondary | ICD-10-CM

## 2021-08-10 MED ORDER — DUPIXENT 300 MG/2ML ~~LOC~~ SOSY: 300.0000 mg | PREFILLED_SYRINGE | SUBCUTANEOUS | 0 refills | Status: DC

## 2021-08-10 NOTE — Progress Notes (Signed)
Virtual Visit via Video Note  I connected with Galileah Piggee Sarno on 08/10/21 at  1:00 PM EDT by a video enabled telemedicine application and verified that I am speaking with the correct person using two identifiers.  Location: Patient: home Provider: remote office [place], [place])   I discussed the limitations of evaluation and management by telemedicine and the availability of in person appointments. The patient expressed understanding and agreed to proceed.  I discussed the assessment and treatment plan with the patient. The patient was provided an opportunity to ask questions and all were answered. The patient agreed with the plan and demonstrated an understanding of the instructions.   The patient was advised to call back or seek an in-person evaluation if the symptoms worsen or if the condition fails to improve as anticipated.  I provided 60 minutes of non-face-to-face time during this encounter.   Cavion Faiola R Jackline Castilla, LCSW   THERAPIST PROGRESS NOTE  Session Time: 1-2p  Participation Level: Active  Behavioral Response: Neat and Well GroomedAlertAnxious and Depressed  Type of Therapy: Individual Therapy  Treatment Goals addressed: Anxiety and Diagnosis: depression  Interventions: CBT  Summary: TANICIA WOLAVER is a 45 y.o. female who presents with improving symptoms related to depression diagnosis. Pt reports that mood has improved and that she is getting good quality and quantity of sleep. Allowed pt to explore and express thoughts and feelings associated with recent external stressors. Pt reports that she recently had a birthday party for her daughter and that her sister's daughter tagged on for a joint birthday party after pt had planned the event. "I didn't really care, but I wish I knew more in advance".  Pt reports that her sister was not invited to the party and that her parents both had Covid. 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 Pt reports that "people say I look and seem depressed". Discussed some physical symptoms that pt is experiencing: lethargy, muscle pain, trouble getting dressed/lifting arms.  Pt states that she has fibromyalgia and that she feels it impacts her overall functioning at times. Discussed different ways pt copes  with the ongoing pain.  Allowed pt to identify coping strategies for both stress and anxiety. Discussed recent depression and anxiety triggers. Pt stated that she recently lost a friend--allowed pt safe space to express her grief. Continued recommendations are as follows: self care behaviors, positive social engagements, focusing on overall work/home/life balance, and focusing on positive physical and emotional wellness.    Suicidal/Homicidal: No  Therapist Response: [name] is continuing to reduce overall level, frequency, and intensity of anxiety that she is experiencing. Britt Bottom is trying to stabilize her anxiety level while increasing her ability to function on a daily basis. Patient is able to identify and anxiety coping mechanism that has been successful in the past and increase its use. Patient is trying hard to reduce overall irritability and increase normal social interaction with family and friends. Patient is working hard to cut back on substance use (nicotine). these behaviors are reflective of overall personal growth and progress. Treatment to continue as indicated.    Plan: Return again in 4 weeks.  Diagnosis: Axis I: MDD, recurrent, mild    Axis II: No diagnosis    Ernest Haber Mamoru Takeshita, LCSW 08/10/2021

## 2021-08-13 ENCOUNTER — Other Ambulatory Visit: Payer: Self-pay

## 2021-08-13 ENCOUNTER — Inpatient Hospital Stay (HOSPITAL_BASED_OUTPATIENT_CLINIC_OR_DEPARTMENT_OTHER): Payer: 59 | Admitting: Genetic Counselor

## 2021-08-13 ENCOUNTER — Inpatient Hospital Stay: Payer: 59

## 2021-08-13 DIAGNOSIS — Z803 Family history of malignant neoplasm of breast: Secondary | ICD-10-CM | POA: Diagnosis not present

## 2021-08-19 ENCOUNTER — Telehealth: Payer: Self-pay

## 2021-08-19 NOTE — Telephone Encounter (Signed)
Spoke with patient today regarding injections. She accidentally injected 1 week to early (last week). Patient questioned should she skip this week or should she get back on schedule. Advised patient to not inject this week. She needed to get back on 1 injection every 2 weeks from last injection date (last week) to not over do it.

## 2021-08-28 ENCOUNTER — Telehealth: Payer: Self-pay | Admitting: Genetic Counselor

## 2021-08-28 ENCOUNTER — Encounter: Payer: Self-pay | Admitting: Genetic Counselor

## 2021-08-28 ENCOUNTER — Ambulatory Visit: Payer: Self-pay | Admitting: Genetic Counselor

## 2021-08-28 DIAGNOSIS — Z803 Family history of malignant neoplasm of breast: Secondary | ICD-10-CM | POA: Insufficient documentation

## 2021-08-28 DIAGNOSIS — Z1379 Encounter for other screening for genetic and chromosomal anomalies: Secondary | ICD-10-CM

## 2021-08-28 NOTE — Telephone Encounter (Signed)
Revealed negative genetic testing.  Discussed that we do not know why she has cancer in the family. It could be due to a different gene that we are not testing, or maybe our current technology may not be able to pick something up.  It will be important for her to keep in contact with genetics to keep up with whether additional testing may be needed.   
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
## 2021-08-28 NOTE — Progress Notes (Signed)
HPI:  Kristy Shaw was previously seen in the [facility] clinic due to a family history of breast cancer and concerns regarding a hereditary predisposition to cancer. Please refer to our prior cancer genetics clinic note for more information regarding our discussion, assessment and recommendations, at the time. Kristy Shaw recent genetic test results were disclosed to her, as were recommendations warranted by these results. These results and recommendations are discussed in more detail below.  CANCER HISTORY:  Oncology History   No history exists.    FAMILY HISTORY:  We obtained a detailed, 4-generation family history.  Significant diagnoses are listed below: Family History  Problem Relation Age of Onset   Hypertension Mother    Stroke Mother    Heart attack Father    Stroke Father    Depression Father    Depression Sister    Kidney disease Brother    ADD / ADHD Brother    Asthma Brother    Breast cancer Maternal Grandmother 69      GENETIC TEST RESULTS: Genetic testing reported out on August 28, 2021 through the CancerNext-Expanded+RNAinsight cancer panel found no pathogenic mutations. The CancerNext-Expanded gene panel offered by [facility] and includes sequencing and rearrangement analysis for the following 77 genes: AIP, ALK, APC*, ATM*, AXIN2, BAP1, BARD1, BLM, BMPR1A, BRCA1*, BRCA2*, BRIP1*, CDC73, CDH1*, CDK4, CDKN1B, CDKN2A, CHEK2*, CTNNA1, DICER1, FANCC, FH, FLCN, GALNT12, KIF1B, LZTR1, MAX, MEN1, MET, MLH1*, MSH2*, MSH3, MSH6*, MUTYH*, NBN, NF1*, NF2, NTHL1, PALB2*, PHOX2B, PMS2*, POT1, PRKAR1A, PTCH1, PTEN*, RAD51C*, RAD51D*, RB1, RECQL, RET, SDHA, SDHAF2, SDHB, SDHC, SDHD, SMAD4, SMARCA4, SMARCB1, SMARCE1, STK11, SUFU, TMEM127, TP53*, TSC1, TSC2, VHL and XRCC2 (sequencing and deletion/duplication); EGFR, EGLN1, HOXB13, KIT, MITF, PDGFRA, POLD1, and POLE (sequencing only); EPCAM and GREM1 (deletion/duplication only). DNA and RNA analyses performed for *  genes. The test report has been scanned into EPIC and is located under the Molecular Pathology section of the Results Review tab.  A portion of the result report is included below for reference.     We discussed with Kristy Shaw that because current genetic testing is not perfect, it is possible there may be a gene mutation in one of these genes that current testing cannot detect, but that chance is small.  We also discussed, that there could be another gene that has not yet been discovered, or that we have not yet tested, that is responsible for the cancer diagnoses in the family. It is also possible there is a hereditary cause for the cancer in the family that Kristy Shaw did not inherit and therefore was not identified in her testing.  Therefore, it is important to remain in touch with cancer genetics in the future so that we can continue to offer Kristy Shaw the most up to date genetic testing.   ADDITIONAL GENETIC TESTING: We discussed with Kristy Shaw that her genetic testing was fairly extensive.  If there are genes identified to increase cancer risk that can be analyzed in the future, we would be happy to discuss and coordinate this testing at that time.    CANCER SCREENING RECOMMENDATIONS: Kristy Shaw test result is considered negative (normal).  This means that we have not identified a hereditary cause for her family history of cancer at this time. Most cancers happen by chance and this negative test suggests that her cancer may fall into this category.    While reassuring, this does not definitively rule out a hereditary predisposition to cancer. It is still possible that there  could be genetic mutations that are undetectable by current technology. There could be genetic mutations in genes that have not been tested or identified to increase cancer risk.  Therefore, it is recommended she continue to follow the cancer management and screening guidelines provided by her oncology and primary  healthcare provider.   An individual's cancer risk and medical management are not determined by genetic test results alone. Overall cancer risk assessment incorporates additional factors, including personal medical history, family history, and any available genetic information that may result in a personalized plan for cancer prevention and surveillance  Based on Kristy Shaw's family of cancer, as well as her genetic test results, statistical models (Tyrer Cusik)  and literature data were used to estimate her risk of developing breast cancer. These estimate her lifetime risk of developing breast cancer to be approximately 29.2%.  The patient's lifetime breast cancer risk is a preliminary estimate based on available information using one of several models endorsed by the [place] (ACS). The ACS recommends consideration of breast MRI screening as an adjunct to mammography for patients at high risk (defined as 20% or greater lifetime risk). Please note that a woman's breast cancer risk changes over time. It may increase or decrease based on age and any changes to the personal and/or family medical history. The risks and recommendations listed above apply to this patient at this point in time. In the future, she may or may not be eligible for the same medical management strategies and, in some cases, other medical management strategies may become available to her. If she is interested in an updated breast cancer risk assessment at a later date, she can contact us.  Kristy Shaw has been determined to be at high risk for breast cancer.  Therefore, we recommend that annual screening with mammography and breast MRI begin at age 38, or 10 years prior to the age of breast cancer diagnosis in a relative (whichever is earlier).  We discussed that Kristy Shaw should discuss her individual situation with her referring physician and determine a breast cancer screening plan with which they are both comfortable.       RECOMMENDATIONS FOR FAMILY MEMBERS:  Individuals in this family might be at some increased risk of developing cancer, over the general population risk, simply due to the family history of cancer.  We recommended women in this family have a yearly mammogram beginning at age 18, or 7 years younger than the earliest onset of cancer, an annual clinical breast exam, and perform monthly breast self-exams. Women in this family should also have a gynecological exam as recommended by their primary provider. All family members should be referred for colonoscopy starting at age 25.  FOLLOW-UP: Lastly, we discussed with Kristy Shaw that cancer genetics is a rapidly advancing field and it is possible that new genetic tests will be appropriate for her and/or her family members in the future. We encouraged her to remain in contact with cancer genetics on an annual basis so we can update her personal and family histories and let her know of advances in cancer genetics that may benefit this family.   Our contact number was provided. Kristy Shaw questions were answered to her satisfaction, and she knows she is welcome to call us at anytime with additional questions or concerns.   Roma Kayser, [place], [facility] Licensed, Certified Genetic Counselor [email or web address]

## 2021-09-10 ENCOUNTER — Ambulatory Visit: Payer: 59 | Admitting: Licensed Clinical Social Worker

## 2021-09-16 ENCOUNTER — Encounter: Payer: Self-pay | Admitting: Psychiatry

## 2021-09-16 ENCOUNTER — Telehealth (INDEPENDENT_AMBULATORY_CARE_PROVIDER_SITE_OTHER): Payer: 59 | Admitting: Psychiatry

## 2021-09-16 ENCOUNTER — Other Ambulatory Visit: Payer: Self-pay

## 2021-09-16 DIAGNOSIS — F1590 Other stimulant use, unspecified, uncomplicated: Secondary | ICD-10-CM

## 2021-09-16 DIAGNOSIS — F17200 Nicotine dependence, unspecified, uncomplicated: Secondary | ICD-10-CM

## 2021-09-16 DIAGNOSIS — Z634 Disappearance and death of family member: Secondary | ICD-10-CM

## 2021-09-16 DIAGNOSIS — F418 Other specified anxiety disorders: Secondary | ICD-10-CM | POA: Diagnosis not present

## 2021-09-16 DIAGNOSIS — F3342 Major depressive disorder, recurrent, in full remission: Secondary | ICD-10-CM

## 2021-09-16 MED ORDER — HYDROXYZINE PAMOATE 25 MG PO CAPS: ORAL_CAPSULE | ORAL | 1 refills | Status: DC

## 2021-09-16 MED ORDER — VENLAFAXINE HCL ER 37.5 MG PO CP24: 37.5000 mg | ORAL_CAPSULE | Freq: Every day | ORAL | 1 refills | Status: DC

## 2021-09-16 MED ORDER — VENLAFAXINE HCL ER 150 MG PO CP24: 150.0000 mg | ORAL_CAPSULE | Freq: Every day | ORAL | 1 refills | Status: DC
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
## 2021-10-06 ENCOUNTER — Other Ambulatory Visit: Payer: Self-pay

## 2021-10-06 ENCOUNTER — Ambulatory Visit (INDEPENDENT_AMBULATORY_CARE_PROVIDER_SITE_OTHER): Payer: 59 | Admitting: Licensed Clinical Social Worker

## 2021-10-06 DIAGNOSIS — F419 Anxiety disorder, unspecified: Secondary | ICD-10-CM | POA: Diagnosis not present

## 2021-10-06 DIAGNOSIS — F3342 Major depressive disorder, recurrent, in full remission: Secondary | ICD-10-CM | POA: Diagnosis not present
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
## 2021-10-06 NOTE — Plan of Care (Signed)
  Problem: Decrease depressive symptoms and improve levels of effective functioning Goal: LTG: Reduce frequency, intensity, and duration of depression symptoms as evidenced by: pt self report Outcome: Progressing Goal: STG: Nashaly WILL PARTICIPATE IN AT LEAST 80% OF SCHEDULED INDIVIDUAL PSYCHOTHERAPY SESSIONS Outcome: Progressing Intervention: WORK WITH [name] TO IDENTIFY THE MAJOR COMPONENTS OF A RECENT EPISODE OF DEPRESSION: PHYSICAL SYMPTOMS, MAJOR THOUGHTS AND IMAGES, AND MAJOR BEHAVIORS THEY EXPERIENCED Intervention: Discuss self-management skills   Problem: Reduce overall frequency, intensity, and duration of the anxiety so that daily functioning is not impaired Goal: LTG: Patient will score less than 5 on the Generalized Anxiety Disorder 7 Scale (GAD-7) Outcome: Progressing Goal: STG: Patient will participate in at least 80% of scheduled individual psychotherapy sessions Outcome: Progressing Intervention: Assist with relaxation techniques, as appropriate (deep breathing exercises, meditation, guided imagery)

## 2021-10-28 ENCOUNTER — Other Ambulatory Visit: Payer: Self-pay | Admitting: Psychiatry

## 2021-10-28 DIAGNOSIS — F330 Major depressive disorder, recurrent, mild: Secondary | ICD-10-CM

## 2021-10-28 NOTE — Progress Notes (Signed)
HEMATOLOGY-ONCOLOGY MYCHART VIDEO VISIT PROGRESS NOTE  I connected with Kristy Shaw on 10/29/2021 at  3:15 PM EST by MyChart video conference and verified that I am speaking with the correct person using two identifiers.  I discussed the limitations, risks, security and privacy concerns of performing an evaluation and management service by MyChart and the availability of in person appointments.  I also discussed with the patient that there may be a patient responsible charge related to this service. The patient expressed understanding and agreed to proceed.  Patient's Location: Home Physician Location: Clinic  CHIEF COMPLIANT: Follow-up of high risk of breast cancer  INTERVAL HISTORY: Kristy Shaw is a 45 y.o. female with above-mentioned history of high risk of breast cancer, currently on tamoxifen. She presents via MyChart today for follow-up.  She denies any side effects to tamoxifen.  Does not have any hot flashes or arthralgias or myalgias.  Observations/Objective:  There were no vitals filed for this visit. There is no height or weight on file to calculate BMI.  I have reviewed the data as listed CMP Latest Ref Rng & Units 06/10/2021 06/02/2021 08/08/2018  Glucose 70 - 99 mg/dL 75 84 94  BUN 6 - 20 mg/dL 5(L) <7(L) 5(L)  Creatinine 0.44 - 1.00 mg/dL 8.92 1.19 4.17  Sodium 135 - 145 mmol/L 139 136 136  Potassium 3.5 - 5.1 mmol/L 3.0(L) 3.0(L) 2.9(L)  Chloride 98 - 111 mmol/L 102 100 103  CO2 22 - 32 mmol/L - 27 27  Calcium 8.9 - 10.3 mg/dL - 8.8(L) 8.3(L)  Total Protein 6.5 - 8.1 g/dL - 7.4 -  Total Bilirubin 0.3 - 1.2 mg/dL - 0.6 -  Alkaline Phos 38 - 126 U/L - 57 -  AST 15 - 41 U/L - 16 -  ALT 0 - 44 U/L - 11 -    Lab Results  Component Value Date   WBC 7.5 06/02/2021   HGB 13.3 06/10/2021   HCT 39.0 06/10/2021   MCV 95.0 06/02/2021   PLT 234 06/02/2021   NEUTROABS 6.7 (H) 12/25/2014      Assessment Plan:  Complex sclerosing lesion of right breast Risk  assessment: Tyrer-Cuzick 10-year risk: 6% (average risk 2.4%) Lifetime risk 27% (average risk 10.5%)   Recommendation: 1.  Tamoxifen to reduce the risk of breast cancer we discussed about different doses and based upon TAM-01 clinical trial we will start her at 5 mg a day. 2. patient stays very active and exercise regularly.  Eats fruits and vegetables and does not eat too much red meat. ------------------------------------------------------------------------------------------------------------------------ Tamoxifen toxicities:No side effects  Breast cancer surveillance: Mammograms to be ordered to be done in the next month Breast MRI will be done in 6 months  Return to clinic in 1 year for follow-up    I discussed the assessment and treatment plan with the patient. The patient was provided an opportunity to ask questions and all were answered. The patient agreed with the plan and demonstrated an understanding of the instructions. The patient was advised to call back or seek an in-person evaluation if the symptoms worsen or if the condition fails to improve as anticipated.   Total time spent: 20 minutes including face-to-face MyChart video visit time and time spent for planning, charting and coordination of care  Sabas Sous, MD 10/29/2021  I, Alda Ponder am acting as scribe for Serena Croissant, MD.  I have reviewed the above documentation for accuracy and completeness, and I agree with the above.

## 2021-10-29 ENCOUNTER — Telehealth: Payer: 59 | Admitting: Hematology and Oncology

## 2021-10-29 DIAGNOSIS — N6489 Other specified disorders of breast: Secondary | ICD-10-CM | POA: Diagnosis not present

## 2021-10-29 DIAGNOSIS — Z9189 Other specified personal risk factors, not elsewhere classified: Secondary | ICD-10-CM | POA: Diagnosis not present

## 2021-10-29 NOTE — Assessment & Plan Note (Signed)
Risk assessment: Tyrer-Cuzick 10-year risk: 6% (average risk 2.4%) Lifetime risk 27% (average risk 10.5%)  Recommendation: 1.  Tamoxifen to reduce the risk of breast cancer we discussed about different doses and based upon TAM-01 clinical trial we will start her at 5 mg a day. 2. patient stays very active and exercise regularly.  Eats fruits and vegetables and does not eat too much red meat. ------------------------------------------------------------------------------------------------------------------------ Tamoxifen toxicities:  Breast cancer surveillance:  Return to clinic in 1 year for follow-up

## 2021-11-10 ENCOUNTER — Other Ambulatory Visit: Payer: Self-pay

## 2021-11-10 ENCOUNTER — Ambulatory Visit (INDEPENDENT_AMBULATORY_CARE_PROVIDER_SITE_OTHER): Payer: 59 | Admitting: Licensed Clinical Social Worker

## 2021-11-10 DIAGNOSIS — F419 Anxiety disorder, unspecified: Secondary | ICD-10-CM | POA: Diagnosis not present

## 2021-11-10 DIAGNOSIS — F3342 Major depressive disorder, recurrent, in full remission: Secondary | ICD-10-CM

## 2021-11-10 NOTE — Progress Notes (Signed)
Virtual Visit via Video Note  I connected with Kristy Shaw on 11/10/21 at  2:00 PM EST by a video enabled telemedicine application and verified that I am speaking with the correct person using two identifiers.  Location: Patient: home Provider: remote office [place], [place])   I discussed the limitations of evaluation and management by telemedicine and the availability of in person appointments. The patient expressed understanding and agreed to proceed.   I discussed the assessment and treatment plan with the patient. The patient was provided an opportunity to ask questions and all were answered. The patient agreed with the plan and demonstrated an understanding of the instructions.   The patient was advised to call back or seek an in-person evaluation if the symptoms worsen or if the condition fails to improve as anticipated.  I provided 45 minutes of non-face-to-face time during this encounter.   Dow Blahnik R Jadrian Bulman, LCSW  THERAPIST PROGRESS NOTE  Session Time: 2-245p  Participation Level: Active  Behavioral Response: NeatAlertAnxious and Depressed  Type of Therapy: Individual Therapy  Treatment Goals addressed:  Goal: LTG: Reduce frequency, intensity, and duration of depression symptoms as evidenced by: pt self report Outcome: Progressing  Goal: STG: Kristy Shaw WILL PARTICIPATE IN AT LEAST 80% OF SCHEDULED INDIVIDUAL PSYCHOTHERAPY SESSIONS Outcome: Progressing  Goal: LTG: Patient will score less than 5 on the Generalized Anxiety Disorder 7 Scale (GAD-7) Outcome: Progressing  Goal: STG: Patient will participate in at least 80% of scheduled individual psychotherapy sessions Outcome: Progressing  Interventions:  Intervention: Assist with relaxation techniques, as appropriate (deep breathing exercises, meditation, guided imagery)  Intervention: WORK WITH [name] TO IDENTIFY THE MAJOR COMPONENTS OF A RECENT EPISODE OF DEPRESSION: PHYSICAL SYMPTOMS, MAJOR THOUGHTS AND  IMAGES, AND MAJOR BEHAVIORS THEY EXPERIENCED  Intervention: Motivational interviewing regarding physical activity  Summary: Kristy Shaw is a 45 y.o. female who presents with improving symptoms related to depression and anxiety. Pt reports that overall mood has been stable and that she is managing situational stressors well. Pt compliant with medication and feels that she has been getting good quality and quantity of sleep "maybe too much sleep". Pt does state that she's sleeping in until noon-2pm each day. Pt smoking nicotine and THC--doesn't smoke THC until later in the day. Discussed effects of THC on motivation, hypersomnia, and depression. Pt states that she doesn't feel physically well for the past two weeks and feels that she should follow up at the PCP.  Encouraged pt to prioritize her physical health.  Allowed pt to explore and express thoughts and feelings associated with recent life situations and external stressors. Discussed recent trip/vacation. Discussed pt feeling some distress because she wants projects around the house done and is feeling unmotivated because they are not done. Reviewed steps that pt can take to break down larger projects into smaller, more manageable tasks. Pt reports that she has not made a lot of effort to focus on self care since last session. Used motivational interviewing to encourage pt to focus more on self care and physical activity--pt admits that she is not moving a lot throughout the day.  Continued recommendations are as follows: self care behaviors, positive social engagements, focusing on overall work/home/life balance, and focusing on positive physical and emotional wellness.   Suicidal/Homicidal: No  Therapist Response: Pt is continuing to apply interventions learned in session into daily life situations. Pt is currently on track to meet goals utilizing interventions mentioned above. Personal growth and progress noted. Treatment to continue as  indicated.  Plan: Return again in 4 weeks.  Diagnosis: Axis I: MDD, recurrent, in full remission    Axis II: No diagnosis    Ernest Haber Lanetra Hartley, LCSW 11/10/2021

## 2021-11-10 NOTE — Plan of Care (Signed)
  Problem: Decrease depressive symptoms and improve levels of effective functioning Goal: LTG: Reduce frequency, intensity, and duration of depression symptoms as evidenced by: pt self report Outcome: Not Progressing Goal: STG: Kristy Shaw WILL PARTICIPATE IN AT LEAST 80% OF SCHEDULED INDIVIDUAL PSYCHOTHERAPY SESSIONS Outcome: Progressing Intervention: Perform motivational interviewing regarding physical activity

## 2021-11-16 ENCOUNTER — Other Ambulatory Visit: Payer: Self-pay

## 2021-11-16 DIAGNOSIS — L2089 Other atopic dermatitis: Secondary | ICD-10-CM

## 2021-11-16 MED ORDER — DUPIXENT 300 MG/2ML ~~LOC~~ SOSY: 300.0000 mg | PREFILLED_SYRINGE | SUBCUTANEOUS | 0 refills | Status: DC

## 2021-11-17 ENCOUNTER — Ambulatory Visit (INDEPENDENT_AMBULATORY_CARE_PROVIDER_SITE_OTHER): Payer: 59 | Admitting: Psychiatry

## 2021-11-17 ENCOUNTER — Encounter: Payer: Self-pay | Admitting: Psychiatry

## 2021-11-17 ENCOUNTER — Other Ambulatory Visit: Payer: Self-pay

## 2021-11-17 VITALS — BP 96/70 | HR 87 | Temp 97.8°F | Wt 128.6 lb

## 2021-11-17 DIAGNOSIS — F330 Major depressive disorder, recurrent, mild: Secondary | ICD-10-CM

## 2021-11-17 DIAGNOSIS — F17200 Nicotine dependence, unspecified, uncomplicated: Secondary | ICD-10-CM

## 2021-11-17 DIAGNOSIS — F418 Other specified anxiety disorders: Secondary | ICD-10-CM | POA: Diagnosis not present

## 2021-11-17 DIAGNOSIS — Z634 Disappearance and death of family member: Secondary | ICD-10-CM

## 2021-11-17 DIAGNOSIS — F1590 Other stimulant use, unspecified, uncomplicated: Secondary | ICD-10-CM

## 2021-11-17 DIAGNOSIS — G479 Sleep disorder, unspecified: Secondary | ICD-10-CM

## 2021-11-17 HISTORY — DX: Sleep disorder, unspecified: G47.9

## 2021-11-17 MED ORDER — VENLAFAXINE HCL ER 75 MG PO CP24: 75.0000 mg | ORAL_CAPSULE | Freq: Every day | ORAL | 0 refills | Status: DC
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
## 2021-12-08 ENCOUNTER — Ambulatory Visit
Admission: RE | Admit: 2021-12-08 | Discharge: 2021-12-08 | Disposition: A | Discharge status: Home / Self Care | Payer: 59 | Source: Ambulatory Visit | Attending: Hematology and Oncology | Admitting: Hematology and Oncology

## 2021-12-08 DIAGNOSIS — Z9189 Other specified personal risk factors, not elsewhere classified: Secondary | ICD-10-CM

## 2021-12-08 DIAGNOSIS — N6489 Other specified disorders of breast: Secondary | ICD-10-CM

## 2021-12-09 ENCOUNTER — Ambulatory Visit (INDEPENDENT_AMBULATORY_CARE_PROVIDER_SITE_OTHER): Payer: 59 | Admitting: Psychiatry

## 2021-12-09 ENCOUNTER — Other Ambulatory Visit: Payer: Self-pay

## 2021-12-09 ENCOUNTER — Encounter: Payer: Self-pay | Admitting: Psychiatry

## 2021-12-09 VITALS — BP 133/78 | HR 76 | Temp 98.3°F | Ht 68.5 in | Wt 134.0 lb

## 2021-12-09 DIAGNOSIS — F1590 Other stimulant use, unspecified, uncomplicated: Secondary | ICD-10-CM

## 2021-12-09 DIAGNOSIS — F330 Major depressive disorder, recurrent, mild: Secondary | ICD-10-CM

## 2021-12-09 DIAGNOSIS — G479 Sleep disorder, unspecified: Secondary | ICD-10-CM

## 2021-12-09 DIAGNOSIS — F418 Other specified anxiety disorders: Secondary | ICD-10-CM

## 2021-12-09 DIAGNOSIS — F17200 Nicotine dependence, unspecified, uncomplicated: Secondary | ICD-10-CM | POA: Diagnosis not present
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
## 2021-12-29 ENCOUNTER — Other Ambulatory Visit: Payer: Self-pay

## 2021-12-29 ENCOUNTER — Ambulatory Visit: Payer: 59 | Admitting: Licensed Clinical Social Worker

## 2021-12-30 NOTE — Progress Notes (Signed)
Pt cancelled appt due to being sick

## 2021-12-31 ENCOUNTER — Telehealth: Payer: Self-pay

## 2021-12-31 NOTE — Telephone Encounter (Signed)
Patient left nurse voicemail that she is currently positive for COVID. She was due for Dupixent injection yesterday but waiting to hear from our office if okay. Patient should skip this injection and start back in two weeks as long as COVID symptoms have improved.  Patient advised. aw

## 2022-01-01 ENCOUNTER — Telehealth: Payer: Self-pay

## 2022-01-01 DIAGNOSIS — F3342 Major depressive disorder, recurrent, in full remission: Secondary | ICD-10-CM

## 2022-01-01 DIAGNOSIS — F17200 Nicotine dependence, unspecified, uncomplicated: Secondary | ICD-10-CM

## 2022-01-01 DIAGNOSIS — F418 Other specified anxiety disorders: Secondary | ICD-10-CM

## 2022-01-01 DIAGNOSIS — F330 Major depressive disorder, recurrent, mild: Secondary | ICD-10-CM

## 2022-01-01 MED ORDER — GABAPENTIN 300 MG PO CAPS: ORAL_CAPSULE | ORAL | 0 refills | Status: DC

## 2022-01-01 MED ORDER — ARIPIPRAZOLE 5 MG PO TABS: 5.0000 mg | ORAL_TABLET | Freq: Every day | ORAL | 0 refills | Status: DC

## 2022-01-01 MED ORDER — HYDROXYZINE PAMOATE 25 MG PO CAPS: ORAL_CAPSULE | ORAL | 1 refills | Status: AC

## 2022-01-01 NOTE — Telephone Encounter (Signed)
I have sent Abilify, gabapentin and hydroxyzine to pharmacy.

## 2022-01-01 NOTE — Telephone Encounter (Signed)
pt appt had to be r/s so she needs to get enought medication to get to her next appt.

## 2022-01-05 ENCOUNTER — Telehealth: Payer: Self-pay

## 2022-01-05 NOTE — Telephone Encounter (Signed)
Medication management - Telephone call with patient 2 times this date and with her [facility] to verify she still has an order of her Venlafaxine XR 150 mg there and they will fill this for her.  Pt. was concerned she would run out before appt 02/03/22.  Patient to call back if any problems filling order as she just wanted to make sure she will have enough of both Venlafaxine XR dosages until appt 02/03/22.

## 2022-01-21 ENCOUNTER — Other Ambulatory Visit: Payer: Self-pay | Admitting: Hematology and Oncology

## 2022-02-03 ENCOUNTER — Telehealth (INDEPENDENT_AMBULATORY_CARE_PROVIDER_SITE_OTHER): Payer: 59 | Admitting: Psychiatry

## 2022-02-03 ENCOUNTER — Other Ambulatory Visit: Payer: Self-pay

## 2022-02-03 ENCOUNTER — Encounter: Payer: Self-pay | Admitting: Psychiatry

## 2022-02-03 DIAGNOSIS — F17200 Nicotine dependence, unspecified, uncomplicated: Secondary | ICD-10-CM

## 2022-02-03 DIAGNOSIS — F3342 Major depressive disorder, recurrent, in full remission: Secondary | ICD-10-CM | POA: Diagnosis not present

## 2022-02-03 DIAGNOSIS — F418 Other specified anxiety disorders: Secondary | ICD-10-CM

## 2022-02-03 DIAGNOSIS — G479 Sleep disorder, unspecified: Secondary | ICD-10-CM | POA: Diagnosis not present

## 2022-02-03 DIAGNOSIS — F1590 Other stimulant use, unspecified, uncomplicated: Secondary | ICD-10-CM

## 2022-02-03 MED ORDER — ARIPIPRAZOLE 5 MG PO TABS: 5.0000 mg | ORAL_TABLET | Freq: Every day | ORAL | 0 refills | Status: DC

## 2022-02-03 MED ORDER — VENLAFAXINE HCL ER 150 MG PO CP24: 150.0000 mg | ORAL_CAPSULE | Freq: Every day | ORAL | 1 refills | Status: DC

## 2022-02-03 MED ORDER — VENLAFAXINE HCL ER 75 MG PO CP24: 75.0000 mg | ORAL_CAPSULE | Freq: Every day | ORAL | 0 refills | Status: DC
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
## 2022-02-05 ENCOUNTER — Ambulatory Visit (INDEPENDENT_AMBULATORY_CARE_PROVIDER_SITE_OTHER): Payer: 59 | Admitting: Licensed Clinical Social Worker

## 2022-02-05 ENCOUNTER — Other Ambulatory Visit: Payer: Self-pay

## 2022-02-05 DIAGNOSIS — F3342 Major depressive disorder, recurrent, in full remission: Secondary | ICD-10-CM
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
## 2022-02-06 NOTE — Plan of Care (Signed)
°  Problem: Decrease depressive symptoms and improve levels of effective functioning Goal: LTG: Reduce frequency, intensity, and duration of depression symptoms as evidenced by: pt self report Outcome: Progressing Goal: STG: Christen WILL PARTICIPATE IN AT LEAST 80% OF SCHEDULED INDIVIDUAL PSYCHOTHERAPY SESSIONS Outcome: Progressing Intervention: Encourage verbalization of feelings/concerns/expectations Intervention: Encourage self-care activities Intervention: REVIEW PLEASE SKILLS (TREAT PHYSICAL ILLNESS, BALANCE EATING, AVOID MOOD-ALTERING SUBSTANCES, BALANCE SLEEP AND GET EXERCISE) WITH [name] Intervention: Assess/Monitor for acute and/or chronic pain   Problem: Reduce overall frequency, intensity, and duration of the anxiety so that daily functioning is not impaired Goal: LTG: Patient will score less than 5 on the Generalized Anxiety Disorder 7 Scale (GAD-7) Outcome: Progressing Goal: STG: Patient will participate in at least 80% of scheduled individual psychotherapy sessions Outcome: Progressing Intervention: Assist with relaxation techniques, as appropriate (deep breathing exercises, meditation, guided imagery) Intervention: Manage signs of labile or escalating emotions

## 2022-02-16 ENCOUNTER — Other Ambulatory Visit: Payer: Self-pay

## 2022-02-16 ENCOUNTER — Ambulatory Visit: Payer: 59 | Admitting: Dermatology

## 2022-02-16 DIAGNOSIS — L2089 Other atopic dermatitis: Secondary | ICD-10-CM | POA: Diagnosis not present

## 2022-02-16 DIAGNOSIS — L2081 Atopic neurodermatitis: Secondary | ICD-10-CM | POA: Diagnosis not present

## 2022-02-16 DIAGNOSIS — L281 Prurigo nodularis: Secondary | ICD-10-CM

## 2022-02-16 DIAGNOSIS — L700 Acne vulgaris: Secondary | ICD-10-CM | POA: Diagnosis not present

## 2022-02-16 MED ORDER — FINACEA 15 % EX FOAM: CUTANEOUS | 5 refills | Status: DC

## 2022-02-16 MED ORDER — DUPIXENT 300 MG/2ML ~~LOC~~ SOSY: 300.0000 mg | PREFILLED_SYRINGE | SUBCUTANEOUS | 1 refills | Status: DC

## 2022-02-16 NOTE — Patient Instructions (Signed)

## 2022-02-16 NOTE — Progress Notes (Signed)
Follow-Up Visit   Subjective  Kristy Shaw is a 46 y.o. female who presents for the following: Follow-up.  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 She is on Dupixent injections q 2 weeks, Clobetasol Ointment , and Clobetasol/CeraVe mix as needed. No side effects and no eye irritation. Patient does have asthma and hasn't had to use inhalers much lately.  She is very happy with current treatment results.  The following portions of the chart were reviewed this encounter and updated as appropriate:       Review of Systems:  No other skin or systemic complaints except as noted in HPI or Assessment and Plan.  Objective  Well appearing patient in no apparent distress; mood and affect are within normal limits.  A focused examination was performed including face, trunk, extremities. Relevant physical exam findings are noted in the Assessment and Plan.  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  Atopic dermatitis - Severe, on Dupixent (biologic medication).  Atopic dermatitis (eczema) is a chronic, relapsing, pruritic condition that can significantly affect quality of life. It is often associated with allergic rhinitis and/or asthma and can require treatment with topical medications, phototherapy, or in severe cases a biologic medication called Dupixent, which requires long term medication management.   Continue Dupixent injections q 2 weeks dsp 3 mo supply 1Rf  Continue clobetasol/CeraVe mix qd/bid or Clobetasol ointment prn flares. Avoid face, groin, axilla. Patient will call for refills.   Dupilumab (Dupixent) is  a treatment given by injection for adults and children with moderate-to-severe atopic dermatitis. Goal is control of skin condition, not cure. It is given as 2 injections at the first dose followed by 1 injection ever 2 weeks thereafter.  Young children are dosed monthly.  Potential side effects include allergic reaction, herpes infections, injection site reactions and conjunctivitis (inflammation of the eyes).  The use of Dupixent requires long term medication management, including periodic office visits.  Topical steroids (such as triamcinolone, fluocinolone, fluocinonide, mometasone, clobetasol, halobetasol, betamethasone, hydrocortisone) can cause thinning and lightening of the skin if they are used for too long in the same area. Your physician has selected the right strength medicine for your problem and area affected on the body. Please use your medication only as directed by your physician to prevent side effects.     Related Medications clobetasol ointment (TEMOVATE) 0.05 % Apply 1 application topically 2 (two) times daily.  Acne vulgaris face  vs possible Rosacea  Start Finacea Foam Apply to face qd/bid dsp 50g 5Rf.  Rosacea is a chronic progressive skin condition usually affecting the face of adults, causing redness and/or acne bumps. It is treatable but not curable. It sometimes affects the eyes (ocular rosacea) as well. It may respond to topical and/or systemic medication and can flare with stress, sun exposure, alcohol, exercise and some foods.  Daily application of broad spectrum spf 30+ sunscreen to face is recommended to reduce flares.    Azelaic Acid (FINACEA) 15 % FOAM - face Apply to face once to twice daily for acne.  Other atopic dermatitis  Related Medications dupilumab (DUPIXENT) 300 MG/2ML prefilled syringe Inject 300 mg into the skin every 14 (fourteen)  days. Starting at day 15 for maintenance.   Return in about 6 months (around 08/16/2022) for Atopic  Dermatitis, acne/rosacea.  ICherlyn Labella, CMA, am acting as scribe for Willeen Niece, MD . Documentation: I have reviewed the above documentation for accuracy and completeness, and I agree with the above.  Willeen Niece MD

## 2022-03-22 ENCOUNTER — Ambulatory Visit (INDEPENDENT_AMBULATORY_CARE_PROVIDER_SITE_OTHER): Payer: 59 | Admitting: Licensed Clinical Social Worker

## 2022-03-22 DIAGNOSIS — F3341 Major depressive disorder, recurrent, in partial remission: Secondary | ICD-10-CM | POA: Diagnosis not present

## 2022-03-22 NOTE — Progress Notes (Signed)
Virtual Visit via Video Note ? ?I connected with Kristy Shaw on 03/22/22 at  2:00 PM EDT by a video enabled telemedicine application and verified that I am speaking with the correct person using two identifiers. ? ?Location: ?Patient: home ?Provider: remote office [place], [place]) ?  ?I discussed the limitations of evaluation and management by telemedicine and the availability of in person appointments. The patient expressed understanding and agreed to proceed. ?  ?I discussed the assessment and treatment plan with the patient. The patient was provided an opportunity to ask questions and all were answered. The patient agreed with the plan and demonstrated an understanding of the instructions. ?  ?The patient was advised to call back or seek an in-person evaluation if the symptoms worsen or if the condition fails to improve as anticipated. ? ?I provided 20 minutes of non-face-to-face time during this encounter. ? ? ?Ameet Sandy R Lamiyah Schlotter, LCSW ? ? ?THERAPIST PROGRESS NOTE ? ?Session Time: 2-220p ? ?Participation Level: Active ? ?Behavioral Response: Neat and Well GroomedAlertAnxious ? ?Type of Therapy: Individual Therapy ? ?Treatment Goals addressed:  ?Problem: Decrease depressive symptoms and improve levels of effective functioning ? ?Goal: LTG: Reduce frequency, intensity, and duration of depression symptoms as evidenced by: pt self report ?Outcome: Progressing ? ?Goal: STG: [name] WILL PARTICIPATE IN AT LEAST 80% OF SCHEDULED INDIVIDUAL PSYCHOTHERAPY SESSIONS ?Outcome: Progressing ?Problem: Reduce overall frequency, intensity, and duration of the anxiety so that daily functioning is not impaired ? ?Goal: LTG: Patient will score less than 5 on the Generalized Anxiety Disorder 7 Scale (GAD-7) ?Outcome: Progressing ? ?Goal: STG: Patient will participate in at least 80% of scheduled individual psychotherapy sessions ?Outcome: Progressing ? ?ProgressTowards Goals: Progressing ? ?Interventions: Supportive ? ?Summary:  Kristy Shaw is a 46 y.o. female who presents with improving symptoms related to depression and anxiety.  Pt reports that mood has been stable and that she is managing stress and anxiety well. Pt reporting good quality and quantity of sleep.  ? ?Allowed pt to explore and express thoughts and feelings associated with recent life situations and external stressorspatient is showing an enhanced capacity for managing planned projects around the home, which is an example of improving motivation and initiative. Patient is reporting a reduction in overall depression symptoms and feels that she is managing her stress and anxiety well. Patient reports that she is using coping strategies that have been helpful in the past to help navigate through present concerns. Patient states that she wants to maintain current levels of progress and continue to build skills to manage her mood and stress/anxiety levels. Patient reports that she is compliant with her medication and feels that it manages her symptoms well. Patient denies any family conflict recently or any relationship concerns. Patient reports good quality and quantity of sleep.  ? ?Patient did state that she was worried about her husband getting some concerning news about a cologuard screening recently. Pt also concerned about her sister and is getting to the point of accepting sister and her behaviors and focusing on setting boundaries if needed with sister.  ? ?Patient states that overall she feels like she's balancing life well and it's being intentional about self-care.  ? ?Continued recommendations are as follows: self care behaviors, positive social engagements, focusing on overall work/home/life balance, and focusing on positive physical and emotional wellness.  ? ?Suicidal/Homicidal: No ? ?Therapist Response: Pt is continuing to apply interventions learned in session into daily life situations. Pt is currently on track to meet goals utilizing  interventions  mentioned above. Personal growth and progress noted. Treatment to continue as indicated.  ? ?Plan: Return again in 4 weeks. ? ?Diagnosis: MDD (major depressive disorder), recurrent, in partial remission (HCC) ? ?Collaboration of Care: Other Pt encouraged to continue follow up appointments with psychiatrist--Dr. [name] ? ?Patient/Guardian was advised Release of Information must be obtained prior to any record release in order to collaborate their care with an outside provider. Patient/Guardian was advised if they have not already done so to contact the registration department to sign all necessary forms in order for [place] to release information regarding their care.  ? ?Consent: Patient/Guardian gives verbal consent for treatment and assignment of benefits for services provided during this visit. Patient/Guardian expressed understanding and agreed to proceed.  ? ?Jahnavi Muratore R Jalaya Sarver, LCSW ?03/22/2022 ? ?

## 2022-03-22 NOTE — Plan of Care (Signed)
?  Problem: Decrease depressive symptoms and improve levels of effective functioning ?Goal: LTG: Reduce frequency, intensity, and duration of depression symptoms as evidenced by: pt self report ?Outcome: Progressing ?Goal: STG: Kristy Shaw WILL PARTICIPATE IN AT LEAST 80% OF SCHEDULED INDIVIDUAL PSYCHOTHERAPY SESSIONS ?Outcome: Progressing ?  ?Problem: Reduce overall frequency, intensity, and duration of the anxiety so that daily functioning is not impaired ?Goal: LTG: Patient will score less than 5 on the Generalized Anxiety Disorder 7 Scale (GAD-7) ?Outcome: Progressing ?Goal: STG: Patient will participate in at least 80% of scheduled individual psychotherapy sessions ?Outcome: Progressing ?  ?

## 2022-03-28 ENCOUNTER — Other Ambulatory Visit: Payer: Self-pay | Admitting: Psychiatry

## 2022-03-28 DIAGNOSIS — F3342 Major depressive disorder, recurrent, in full remission: Secondary | ICD-10-CM

## 2022-03-30 ENCOUNTER — Encounter (HOSPITAL_COMMUNITY): Payer: Self-pay

## 2022-04-01 ENCOUNTER — Telehealth (INDEPENDENT_AMBULATORY_CARE_PROVIDER_SITE_OTHER): Payer: 59 | Admitting: Psychiatry

## 2022-04-01 ENCOUNTER — Encounter: Payer: Self-pay | Admitting: Psychiatry

## 2022-04-01 DIAGNOSIS — F3342 Major depressive disorder, recurrent, in full remission: Secondary | ICD-10-CM

## 2022-04-01 DIAGNOSIS — F418 Other specified anxiety disorders: Secondary | ICD-10-CM | POA: Diagnosis not present

## 2022-04-01 DIAGNOSIS — G479 Sleep disorder, unspecified: Secondary | ICD-10-CM | POA: Diagnosis not present

## 2022-04-01 DIAGNOSIS — F17200 Nicotine dependence, unspecified, uncomplicated: Secondary | ICD-10-CM | POA: Diagnosis not present

## 2022-04-01 DIAGNOSIS — F1590 Other stimulant use, unspecified, uncomplicated: Secondary | ICD-10-CM
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
## 2022-04-17 ENCOUNTER — Other Ambulatory Visit: Payer: Self-pay | Admitting: Hematology and Oncology

## 2022-04-28 ENCOUNTER — Ambulatory Visit
Admission: RE | Admit: 2022-04-28 | Discharge: 2022-04-28 | Disposition: A | Discharge status: Home / Self Care | Payer: 59 | Source: Ambulatory Visit | Attending: Hematology and Oncology | Admitting: Hematology and Oncology

## 2022-04-28 DIAGNOSIS — N6489 Other specified disorders of breast: Secondary | ICD-10-CM

## 2022-04-28 DIAGNOSIS — Z9189 Other specified personal risk factors, not elsewhere classified: Secondary | ICD-10-CM

## 2022-04-28 MED ORDER — GADOBUTROL 1 MMOL/ML IV SOLN
6.0000 mL | Freq: Once | INTRAVENOUS | Status: AC | PRN
  Administered 2022-04-28: 6 mL via INTRAVENOUS

## 2022-04-30 ENCOUNTER — Telehealth: Payer: Self-pay | Admitting: Hematology and Oncology

## 2022-04-30 NOTE — Telephone Encounter (Signed)
Informed patient that the breast MRI was normal. ?

## 2022-05-01 ENCOUNTER — Other Ambulatory Visit: Payer: Self-pay | Admitting: Psychiatry

## 2022-05-01 DIAGNOSIS — F1590 Other stimulant use, unspecified, uncomplicated: Secondary | ICD-10-CM

## 2022-05-01 DIAGNOSIS — F3342 Major depressive disorder, recurrent, in full remission: Secondary | ICD-10-CM

## 2022-06-15 ENCOUNTER — Ambulatory Visit: Payer: 59 | Admitting: Licensed Clinical Social Worker

## 2022-06-17 ENCOUNTER — Other Ambulatory Visit: Payer: Self-pay

## 2022-06-17 DIAGNOSIS — L2089 Other atopic dermatitis: Secondary | ICD-10-CM

## 2022-06-17 MED ORDER — DUPIXENT 300 MG/2ML ~~LOC~~ SOSY: 300.0000 mg | PREFILLED_SYRINGE | SUBCUTANEOUS | 3 refills | Status: DC

## 2022-06-17 NOTE — Progress Notes (Signed)
Patient's Dupixent is now approved through insurance for 1 year. She has been getting medication through patient assistance. Patient will not get RX through [employer]. Patient advised of change. aw

## 2022-06-21 ENCOUNTER — Telehealth: Payer: Self-pay | Admitting: Hematology and Oncology

## 2022-06-21 NOTE — Telephone Encounter (Signed)
Called to sch per 5/2 inbasket, unable to leave msg, calendar mailed

## 2022-06-23 ENCOUNTER — Other Ambulatory Visit: Payer: Self-pay | Admitting: Psychiatry

## 2022-06-23 DIAGNOSIS — F3342 Major depressive disorder, recurrent, in full remission: Secondary | ICD-10-CM

## 2022-06-24 ENCOUNTER — Encounter: Payer: Self-pay | Admitting: Psychiatry

## 2022-06-24 ENCOUNTER — Telehealth (INDEPENDENT_AMBULATORY_CARE_PROVIDER_SITE_OTHER): Payer: 59 | Admitting: Psychiatry

## 2022-06-24 DIAGNOSIS — F17200 Nicotine dependence, unspecified, uncomplicated: Secondary | ICD-10-CM | POA: Diagnosis not present

## 2022-06-24 DIAGNOSIS — F418 Other specified anxiety disorders: Secondary | ICD-10-CM

## 2022-06-24 DIAGNOSIS — F3342 Major depressive disorder, recurrent, in full remission: Secondary | ICD-10-CM

## 2022-06-24 DIAGNOSIS — F1590 Other stimulant use, unspecified, uncomplicated: Secondary | ICD-10-CM

## 2022-06-24 DIAGNOSIS — G479 Sleep disorder, unspecified: Secondary | ICD-10-CM | POA: Diagnosis not present

## 2022-06-24 MED ORDER — VENLAFAXINE HCL ER 75 MG PO CP24
ORAL_CAPSULE | ORAL | 0 refills | Status: DC
Start: 2022-06-24 — End: 2022-09-21

## 2022-06-24 MED ORDER — VENLAFAXINE HCL ER 150 MG PO CP24: 150.0000 mg | ORAL_CAPSULE | Freq: Every day | ORAL | 0 refills | Status: DC

## 2022-06-24 MED ORDER — ARIPIPRAZOLE 2 MG PO TABS: 2.0000 mg | ORAL_TABLET | Freq: Every day | ORAL | 0 refills | Status: DC

## 2022-06-24 MED ORDER — GABAPENTIN 300 MG PO CAPS
ORAL_CAPSULE | ORAL | 0 refills | Status: DC
Start: 2022-06-24 — End: 2022-09-20
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
## 2022-06-24 NOTE — Patient Instructions (Signed)
STAYQUITCOACH - APP for smoking cessation - You could download on your phone.

## 2022-07-05 ENCOUNTER — Ambulatory Visit (INDEPENDENT_AMBULATORY_CARE_PROVIDER_SITE_OTHER): Payer: 59 | Admitting: Licensed Clinical Social Worker

## 2022-07-05 DIAGNOSIS — F419 Anxiety disorder, unspecified: Secondary | ICD-10-CM

## 2022-07-05 DIAGNOSIS — F3342 Major depressive disorder, recurrent, in full remission: Secondary | ICD-10-CM

## 2022-07-05 NOTE — Plan of Care (Signed)
  Problem: Decrease depressive symptoms and improve levels of effective functioning Goal: LTG: Reduce frequency, intensity, and duration of depression symptoms as evidenced by: pt self report Outcome: Progressing Goal: STG: Kathlen WILL PARTICIPATE IN AT LEAST 80% OF SCHEDULED INDIVIDUAL PSYCHOTHERAPY SESSIONS Outcome: Progressing Intervention: REVIEW PLEASE SKILLS (TREAT PHYSICAL ILLNESS, BALANCE EATING, AVOID MOOD-ALTERING SUBSTANCES, BALANCE SLEEP AND GET EXERCISE) WITH [name] Note: Reviewed Intervention: Encourage verbalization of feelings/concerns/expectations Note: Allowed/explored Intervention: Encourage self-care activities Note: Encouraged   Problem: Reduce overall frequency, intensity, and duration of the anxiety so that daily functioning is not impaired Goal: LTG: Patient will score less than 5 on the Generalized Anxiety Disorder 7 Scale (GAD-7) Outcome: Progressing Goal: STG: Patient will participate in at least 80% of scheduled individual psychotherapy sessions Outcome: Progressing Intervention: Encourage patient to identify triggers Note: Assisted with identification Intervention: Assist with relaxation techniques, as appropriate (deep breathing exercises, meditation, guided imagery) Note: Reviewed Intervention: Assist with coping skills and behavior
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
## 2022-07-15 ENCOUNTER — Other Ambulatory Visit: Payer: Self-pay | Admitting: Hematology and Oncology

## 2022-08-24 ENCOUNTER — Ambulatory Visit: Payer: 59 | Admitting: Dermatology

## 2022-08-24 DIAGNOSIS — L700 Acne vulgaris: Secondary | ICD-10-CM

## 2022-08-24 DIAGNOSIS — L2081 Atopic neurodermatitis: Secondary | ICD-10-CM

## 2022-08-24 DIAGNOSIS — L720 Epidermal cyst: Secondary | ICD-10-CM | POA: Diagnosis not present

## 2022-08-24 DIAGNOSIS — Z79899 Other long term (current) drug therapy: Secondary | ICD-10-CM

## 2022-08-24 MED ORDER — TACROLIMUS 0.1 % EX OINT: TOPICAL_OINTMENT | CUTANEOUS | 3 refills | Status: DC

## 2022-08-24 NOTE — Patient Instructions (Addendum)
At Right Labia   Benign-appearing. Exam most consistent with an epidermal inclusion cyst. Discussed that a cyst is a benign growth that can grow over time and sometimes get irritated or inflamed. Recommend observation if it is not bothersome. Discussed option of surgical excision to remove it if it is growing, symptomatic, or other changes noted. Please call for new or changing lesions so they can be evaluated.    Atopic Neurodermatitis  Avoid picking   Continue Dupixent Continue Clobetasol cerave mix as needed for flares at body. Avoid applying to face, groin, and axilla. Use as directed. Long-term use can cause thinning of the skin.  Start tacrolimus ointment - apply topically twice daily as needed for itchy spots at face and body    Due to recent changes in healthcare laws, you may see results of your pathology and/or laboratory studies on MyChart before the doctors have had a chance to review them. We understand that in some cases there may be results that are confusing or concerning to you. Please understand that not all results are received at the same time and often the doctors may need to interpret multiple results in order to provide you with the best plan of care or course of treatment. Therefore, we ask that you please give [place] 2 business days to thoroughly review all your results before contacting the office for clarification. Should we see a critical lab result, you will be contacted sooner.   If You Need Anything After Your Visit  If you have any questions or concerns for your doctor, please call our main line at [phone] and press option 4 to reach your doctor's medical assistant. If no one answers, please leave a voicemail as directed and we will return your call as soon as possible. Messages left after 4 pm will be answered the following business day.   You may also send [place] a message via MyChart. We typically respond to MyChart messages within 1-2 business days.  For  prescription refills, please ask your pharmacy to contact our office. Our fax number is [phone].  If you have an urgent issue when the clinic is closed that cannot wait until the next business day, you can page your doctor at the number below.    Please note that while we do our best to be available for urgent issues outside of office hours, we are not available 24/7.   If you have an urgent issue and are unable to reach [place], you may choose to seek medical care at your doctor's office, retail clinic, urgent care center, or emergency room.  If you have a medical emergency, please immediately call 911 or go to the emergency department.  Pager Numbers  - Dr. Gwen Pounds: [phone]  - Dr. Neale Burly: [phone]  - Dr. Roseanne Reno: [phone]  In the event of inclement weather, please call our main line at [phone] for an update on the status of any delays or closures.  Dermatology Medication Tips: Please keep the boxes that topical medications come in in order to help keep track of the instructions about where and how to use these. Pharmacies typically print the medication instructions only on the boxes and not directly on the medication tubes.   If your medication is too expensive, please contact our office at [phone] option 4 or send [place] a message through MyChart.   We are unable to tell what your co-pay for medications will be in advance as this is different depending on your insurance coverage. However, we  may be able to find a substitute medication at lower cost or fill out paperwork to get insurance to cover a needed medication.   If a prior authorization is required to get your medication covered by your insurance company, please allow us 1-2 business days to complete this process.  Drug prices often vary depending on where the prescription is filled and some pharmacies may offer cheaper prices.  The website www.goodrx.com contains coupons for medications through different  pharmacies. The prices here do not account for what the cost may be with help from insurance (it may be cheaper with your insurance), but the website can give you the price if you did not use any insurance.  - You can print the associated coupon and take it with your prescription to the pharmacy.  - You may also stop by our office during regular business hours and pick up a GoodRx coupon card.  - If you need your prescription sent electronically to a different pharmacy, notify our office through [place] MyChart or by phone at [phone] option 4.     Si Usted Necesita Algo Despus de Su Visita  Tambin puede enviarnos un mensaje a travs de MyChart. Por lo general respondemos a los mensajes de MyChart en el transcurso de 1 a 2 das hbiles.  Para renovar recetas, por favor pida a su farmacia que se ponga en contacto con nuestra oficina. Nuestro nmero de fax es el [phone].  Si tiene un asunto urgente cuando la clnica est cerrada y que no puede esperar hasta el siguiente da hbil, puede llamar/localizar a su doctor(a) al nmero que aparece a continuacin.   Por favor, tenga en cuenta que aunque hacemos todo lo posible para estar disponibles para asuntos urgentes fuera del horario de oficina, no estamos disponibles las 24 horas del da, los 7 das de la semana.   Si tiene un problema urgente y no puede comunicarse con nosotros, puede optar por buscar atencin mdica  en el consultorio de su doctor(a), en una clnica privada, en un centro de atencin urgente o en una sala de emergencias.  Si tiene una emergencia mdica, por favor llame inmediatamente al 911 o vaya a la sala de emergencias.  Nmeros de bper  - Dr. Kowalski: [phone]  - Dra. Moye: [phone]  - Dra. Stewart: [phone]  En caso de inclemencias del tiempo, por favor llame a nuestra lnea principal al [phone] para una actualizacin sobre el estado de cualquier retraso o cierre.  Consejos para la  medicacin en dermatologa: Por favor, guarde las cajas en las que vienen los medicamentos de uso tpico para ayudarle a seguir las instrucciones sobre dnde y cmo usarlos. Las farmacias generalmente imprimen las instrucciones del medicamento slo en las cajas y no directamente en los tubos del medicamento.   Si su medicamento es muy caro, por favor, pngase en contacto con nuestra oficina llamando al [phone] y presione la opcin 4 o envenos un mensaje a travs de MyChart.   No podemos decirle cul ser su copago por los medicamentos por adelantado ya que esto es diferente dependiendo de la cobertura de su seguro. Sin embargo, es posible que podamos encontrar un medicamento sustituto a menor costo o llenar un formulario para que el seguro cubra el medicamento que se considera necesario.   Si se requiere una autorizacin previa para que su compaa de seguros cubra su medicamento, por favor permtanos de 1 a 2 das hbiles para completar este proceso.  Los precios de los medicamentos   varan con frecuencia dependiendo del lugar de dnde se surte la receta y alguna farmacias pueden ofrecer precios ms baratos.  El sitio web www.goodrx.com tiene cupones para medicamentos de Airline pilot. Los precios aqu no tienen en cuenta lo que podra costar con la ayuda del seguro (puede ser ms barato con su seguro), pero el sitio web puede darle el precio si no utiliz Research scientist (physical sciences).  - Puede imprimir el cupn correspondiente y llevarlo con su receta a la farmacia.  - Tambin puede pasar por nuestra oficina durante el horario de atencin regular y Charity fundraiser una tarjeta de cupones de GoodRx.  - Si necesita que su receta se enve electrnicamente a una farmacia diferente, informe a nuestra oficina a travs de MyChart de [place] o por telfono llamando al [phone] y presione la opcin 4.

## 2022-08-24 NOTE — Progress Notes (Signed)
Follow-Up Visit   Subjective  Kristy Shaw is a 46 y.o. female who presents for the following: atopic neurodermatitis (6 month follow up, currently on dupixent injections biweekly and clobetasol ointment as needed for flares. Has some active spots at arms and face. ) and Other (Patient also reports a bump at right groin area that has been there for some time. Reports bump doesn't bother her but she would like checked to make sure it is nothing serious. ).  Denies injection site reactions, no noticed side effects from Dupixent.   The patient has spots, moles and lesions to be evaluated, some may be new or changing and the patient has concerns that these could be cancer.   The following portions of the chart were reviewed this encounter and updated as appropriate:      Review of Systems: No other skin or systemic complaints except as noted in HPI or Assessment and Plan.   Objective  Well appearing patient in no apparent distress; mood and affect are within normal limits.  A focused examination was performed including b/l arms, forehead, trunk, extremities, right groin. Relevant physical exam findings are noted in the Assessment and Plan.  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 Condition is symptomatic/ bothersome to patient. Improved on Dupixent but not currently at goal.   Atopic dermatitis - Severe, on Dupixent (biologic medication).  Atopic dermatitis (eczema) is a chronic, relapsing, pruritic condition that can significantly affect quality of life. It is often associated with allergic rhinitis and/or asthma and can  require treatment with topical medications, phototherapy, or in severe cases a biologic medication called Dupixent, which requires long term medication management.    Start tacrolimus ointment 0.1 % - apply topically to aa of face and body bid prn for itchy spots Continue Dupixent injections q 2 weeks  Continue clobetasol/CeraVe mix qd/bid or Clobetasol ointment prn flares. Avoid face, groin, axilla. Patient will call for refills.  Pt will apply Clobetasol to prurigo nodule R forearm bid until clear and avoid picking, if not improved on f/up, consider cryotherapy.   Dupilumab (Dupixent) is a treatment given by injection for adults and children with moderate-to-severe atopic dermatitis. Goal is control of skin condition, not cure. It is given as 2 injections at the first dose followed by 1 injection ever 2 weeks thereafter.  Young children are dosed monthly.   Potential side effects include allergic reaction, herpes infections, injection site reactions and conjunctivitis (inflammation of the eyes).  The use of Dupixent requires long term medication management, including periodic office visits.   Topical steroids (such as triamcinolone, fluocinolone, fluocinonide, mometasone, clobetasol, halobetasol, betamethasone, hydrocortisone) can cause thinning and lightening of the skin if they are used for too long in the same area. Your physician has selected the right strength medicine for your problem and area affected on the body. Please use your medication only as directed by your physician to prevent side effects.   tacrolimus (PROTOPIC) 0.1 % ointment - trunk, extremities, and forehead Apply topically to aa of face and body bid prn for itchy spots associated with eczema  Related Medications clobetasol ointment (TEMOVATE) 0.05 % Apply  1 application topically 2 (two) times daily.  Acne vulgaris face  Chronic condition with duration or expected duration over one year. Currently well-controlled.  vs  possible Rosacea   Continue Finacea Foam Apply to face qd/bid prn.   Rosacea is a chronic progressive skin condition usually affecting the face of adults, causing redness and/or acne bumps. It is treatable but not curable. It sometimes affects the eyes (ocular rosacea) as well. It may respond to topical and/or systemic medication and can flare with stress, sun exposure, alcohol, exercise and some foods.  Daily application of broad spectrum spf 30+ sunscreen to face is recommended to reduce flares.  Related Medications Azelaic Acid (FINACEA) 15 % FOAM Apply to face once to twice daily for acne.  Epidermal inclusion cyst right labia/groin  Benign-appearing. Exam most consistent with an epidermal inclusion cyst. Discussed that a cyst is a benign growth that can grow over time and sometimes get irritated or inflamed. Recommend observation if it is not bothersome. Discussed option of surgical excision to remove it if it is growing, symptomatic, or other changes noted. Please call for new or changing lesions so they can be evaluated.    Return in about 6 months (around 02/22/2023) for eczema. I, Asher Muir, CMA, am acting as scribe for Willeen Niece, MD.  Documentation: I have reviewed the above documentation for accuracy and completeness, and I agree with the above.  Willeen Niece MD

## 2022-08-31 ENCOUNTER — Ambulatory Visit (HOSPITAL_COMMUNITY): Payer: 59 | Admitting: Licensed Clinical Social Worker

## 2022-09-18 ENCOUNTER — Other Ambulatory Visit: Payer: Self-pay | Admitting: Psychiatry

## 2022-09-18 DIAGNOSIS — F3342 Major depressive disorder, recurrent, in full remission: Secondary | ICD-10-CM

## 2022-09-20 ENCOUNTER — Telehealth: Payer: Self-pay

## 2022-09-20 DIAGNOSIS — F1590 Other stimulant use, unspecified, uncomplicated: Secondary | ICD-10-CM

## 2022-09-20 MED ORDER — VENLAFAXINE HCL ER 150 MG PO CP24: 150.0000 mg | ORAL_CAPSULE | Freq: Every day | ORAL | 0 refills | Status: DC

## 2022-09-20 NOTE — Telephone Encounter (Signed)
Pt was told that she should have enough medication to get to 10-6.  Told patient that the 150mg  and the 75mg  was sent on the same day.  She states that for some reason she does not have the 150mg      Disp Refills Start End   venlafaxine XR (EFFEXOR-XR) 150 MG 24 hr capsule 90 capsule 0 06/24/2022    Sig - Route: Take 1 capsule (150 mg total) by mouth daily with breakfast. TO BE COMBINED WITH 37.5 MG - Oral   Sent to pharmacy as: venlafaxine XR (EFFEXOR-XR) 150 MG 24 hr capsule   Notes to Pharmacy: Place on file   E-Prescribing Status: Receipt confirmed by pharmacy (06/24/2022  2:12 PM EDT)    venlafaxine XR (EFFEXOR-XR) 75 MG 24 hr capsule 90 capsule 0 06/24/2022    Sig: take 1 capsule by mouth daily with breakfast take along with 150mg  daily   Sent to pharmacy as: venlafaxine XR (EFFEXOR-XR) 75 MG 24 hr capsule   Notes to Pharmacy: For next refill   E-Prescribing Status: Receipt confirmed by pharmacy (06/24/2022  2:12 PM EDT)

## 2022-09-20 NOTE — Telephone Encounter (Signed)
I have sent a limited supply of venlafaxine to pharmacy-150 mg.  She does have appointment with me tomorrow.

## 2022-09-20 NOTE — Telephone Encounter (Signed)
pt left a message that she needs a refill on the venlafaxine xr 150mg . please send to [facility]

## 2022-09-21 ENCOUNTER — Encounter: Payer: Self-pay | Admitting: Psychiatry

## 2022-09-21 ENCOUNTER — Telehealth (INDEPENDENT_AMBULATORY_CARE_PROVIDER_SITE_OTHER): Payer: 59 | Admitting: Psychiatry

## 2022-09-21 DIAGNOSIS — F418 Other specified anxiety disorders: Secondary | ICD-10-CM

## 2022-09-21 DIAGNOSIS — F17200 Nicotine dependence, unspecified, uncomplicated: Secondary | ICD-10-CM

## 2022-09-21 DIAGNOSIS — Z79899 Other long term (current) drug therapy: Secondary | ICD-10-CM

## 2022-09-21 DIAGNOSIS — G479 Sleep disorder, unspecified: Secondary | ICD-10-CM | POA: Diagnosis not present

## 2022-09-21 DIAGNOSIS — F3342 Major depressive disorder, recurrent, in full remission: Secondary | ICD-10-CM

## 2022-09-21 DIAGNOSIS — Z91199 Patient's noncompliance with other medical treatment and regimen due to unspecified reason: Secondary | ICD-10-CM

## 2022-09-21 DIAGNOSIS — F1590 Other stimulant use, unspecified, uncomplicated: Secondary | ICD-10-CM

## 2022-09-21 MED ORDER — VENLAFAXINE HCL ER 75 MG PO CP24: ORAL_CAPSULE | ORAL | 0 refills | Status: DC

## 2022-09-21 MED ORDER — VENLAFAXINE HCL ER 150 MG PO CP24: 150.0000 mg | ORAL_CAPSULE | Freq: Every day | ORAL | 0 refills | Status: DC
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
## 2022-09-21 NOTE — Patient Instructions (Signed)
Stayquitcoach - download app on you phone for smoking cessation.

## 2022-09-22 ENCOUNTER — Telehealth: Payer: Self-pay | Admitting: Psychiatry

## 2022-09-22 DIAGNOSIS — F3342 Major depressive disorder, recurrent, in full remission: Secondary | ICD-10-CM

## 2022-09-22 DIAGNOSIS — Z79899 Other long term (current) drug therapy: Secondary | ICD-10-CM

## 2022-09-22 NOTE — Telephone Encounter (Signed)
Printed out labs including LFT, sodium level, platelet count, TSH, lipid panel, hemoglobin A1c, provided to patient who came into the office.

## 2022-09-27 ENCOUNTER — Other Ambulatory Visit: Payer: Self-pay | Admitting: Dermatology

## 2022-09-27 DIAGNOSIS — L2089 Other atopic dermatitis: Secondary | ICD-10-CM

## 2022-09-30 ENCOUNTER — Ambulatory Visit (INDEPENDENT_AMBULATORY_CARE_PROVIDER_SITE_OTHER): Payer: 59 | Admitting: Licensed Clinical Social Worker

## 2022-09-30 DIAGNOSIS — F3341 Major depressive disorder, recurrent, in partial remission: Secondary | ICD-10-CM

## 2022-09-30 DIAGNOSIS — F418 Other specified anxiety disorders: Secondary | ICD-10-CM | POA: Diagnosis not present
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
## 2022-10-12 ENCOUNTER — Other Ambulatory Visit: Payer: Self-pay | Admitting: Hematology and Oncology

## 2022-10-27 ENCOUNTER — Other Ambulatory Visit: Payer: Self-pay | Admitting: Internal Medicine

## 2022-10-27 DIAGNOSIS — Z1231 Encounter for screening mammogram for malignant neoplasm of breast: Secondary | ICD-10-CM

## 2022-10-27 NOTE — Progress Notes (Signed)
Patient Care Team: Margaretann Loveless, MD as PCP - General (Internal Medicine) Margaretann Loveless, MD as Referring Physician (Internal Medicine) Kieth Brightly, MD (General Surgery)  DIAGNOSIS: No diagnosis found.  SUMMARY OF ONCOLOGIC HISTORY: Oncology History   No history exists.    CHIEF COMPLIANT:   INTERVAL HISTORY: Kristy Shaw is a   ALLERGIES:  is allergic to gold-containing drug products, adhesive [tape], and doxycycline.  MEDICATIONS:  Current Outpatient Medications  Medication Sig Dispense Refill   albuterol (VENTOLIN HFA) 108 (90 Base) MCG/ACT inhaler Inhale 1-2 puffs into the lungs every 6 (six) hours as needed for wheezing or shortness of breath.     ARIPiprazole (ABILIFY) 2 MG tablet TAKE ONE TABLET BY MOUTH ONE TIME DAILY 90 tablet 0   ASMANEX HFA 100 MCG/ACT AERO Inhale 2 puffs into the lungs 2 (two) times daily. (Patient not taking: Reported on 12/09/2021)     clobetasol ointment (TEMOVATE) 0.05 % Apply 1 application topically 2 (two) times daily. 30 g 1   DUPIXENT 300 MG/2ML prefilled syringe INJECT 1 SYRINGE (300MG ) SUBCUTANEOUSLY EVERY 14 DAYS STARTING AT DAY 15 FOR MAINTENANCE 4 mL 5   fluconazole (DIFLUCAN) 150 MG tablet Take 150 mg by mouth once.     gabapentin (NEURONTIN) 300 MG capsule TAKE ONE CAPSULE BY MOUTH IN THE MORNING AND TWO AT BEDTIME 270 capsule 0   hydrOXYzine (VISTARIL) 25 MG capsule TAKE 1 CAPSULE (25 MG TOTAL) BY MOUTH DAILY AS NEEDED. FOR SEVERE ANXIETY SYMPTOMS 90 capsule 1   solifenacin (VESICARE) 5 MG tablet Take by mouth.     tacrolimus (PROTOPIC) 0.1 % ointment Apply topically to aa of face and body bid prn for itchy spots associated with eczema 60 g 3   tamoxifen (NOLVADEX) 10 MG tablet TAKE HALF TABLET BY MOUTH TWICE DAILY 90 tablet 0   venlafaxine XR (EFFEXOR XR) 150 MG 24 hr capsule Take 1 capsule (150 mg total) by mouth daily with breakfast. Take along with 75 mg daily 90 capsule 0   venlafaxine XR (EFFEXOR-XR) 150 MG 24 hr  capsule Take 1 capsule (150 mg total) by mouth daily with breakfast. TO BE COMBINED WITH 37.5 MG 10 capsule 0   venlafaxine XR (EFFEXOR-XR) 75 MG 24 hr capsule take 1 capsule by mouth daily with breakfast take along with 150mg  daily 90 capsule 0   No current facility-administered medications for this visit.    PHYSICAL EXAMINATION: ECOG PERFORMANCE STATUS: {CHL ONC ECOG PS:[id number]}  There were no vitals filed for this visit. There were no vitals filed for this visit.  BREAST:*** No palpable masses or nodules in either right or left breasts. No palpable axillary supraclavicular or infraclavicular adenopathy no breast tenderness or nipple discharge. (exam performed in the presence of a chaperone)  LABORATORY DATA:  I have reviewed the data as listed    Latest Ref Rng & Units 06/10/2021    9:19 AM 06/02/2021    3:18 PM 08/08/2018   10:25 AM  CMP  Glucose 70 - 99 mg/dL 75  84  94   BUN 6 - 20 mg/dL 5  <5  5   Creatinine 06/04/2021 - 1.00 mg/dL 08/10/2018  4.40  3.47   Sodium 135 - 145 mmol/L 139  136  136   Potassium 3.5 - 5.1 mmol/L 3.0  3.0  2.9   Chloride 98 - 111 mmol/L 102  100  103   CO2 22 - 32 mmol/L  27  27   Calcium  8.9 - 10.3 mg/dL  8.8  8.3   Total Protein 6.5 - 8.1 g/dL  7.4    Total Bilirubin 0.3 - 1.2 mg/dL  0.6    Alkaline Phos 38 - 126 U/L  57    AST 15 - 41 U/L  16    ALT 0 - 44 U/L  11      Lab Results  Component Value Date   WBC 7.5 06/02/2021   HGB 13.3 06/10/2021   HCT 39.0 06/10/2021   MCV 95.0 06/02/2021   PLT 234 06/02/2021   NEUTROABS 6.7 (H) 12/25/2014    ASSESSMENT & PLAN:  No problem-specific Assessment & Plan notes found for this encounter.    No orders of the defined types were placed in this encounter.  The patient has a good understanding of the overall plan. she agrees with it. she will call with any problems that may develop before the next visit here. Total time spent: 30 mins including face to face time and time spent for planning, charting  and co-ordination of care   Suzzette Righter, [place] 10/27/22    I Gardiner Coins am scribing for Dr. Lindi Adie  ***

## 2022-10-28 ENCOUNTER — Inpatient Hospital Stay: Payer: 59 | Attending: Hematology and Oncology | Admitting: Hematology and Oncology

## 2022-10-28 VITALS — BP 113/66 | HR 70 | Temp 97.7°F | Resp 18 | Ht 68.5 in | Wt 149.0 lb

## 2022-10-28 DIAGNOSIS — Z79899 Other long term (current) drug therapy: Secondary | ICD-10-CM | POA: Diagnosis not present

## 2022-10-28 DIAGNOSIS — N6489 Other specified disorders of breast: Secondary | ICD-10-CM | POA: Diagnosis present

## 2022-10-28 MED ORDER — TAMOXIFEN CITRATE 10 MG PO TABS: 5.0000 mg | ORAL_TABLET | Freq: Every day | ORAL | 1 refills | Status: DC

## 2022-10-28 NOTE — Assessment & Plan Note (Addendum)
Risk assessment: Tyrer-Cuzick 10-year risk: 6% (average risk 2.4%) Lifetime risk 27% (average risk 10.5%)   Recommendation: 1.  Tamoxifen to reduce the risk of breast cancer we discussed about different doses and based upon TAM-01 clinical trial we will start her at 5 mg a day. 2. patient stays very active and exercise regularly.  Eats fruits and vegetables and does not eat too much red meat. ------------------------------------------------------------------------------------------------------------------------ Tamoxifen toxicities:No side effects   Breast cancer surveillance: Mammograms 12/08/2021: Benign breast density category C Breast MRI 04/29/2022: Benign breast density category C.  I recommended doing MRI every other year. Breast exam 10/28/2022: Benign   Return to clinic in 1 year for follow-up

## 2022-11-10 ENCOUNTER — Encounter (HOSPITAL_COMMUNITY): Payer: Self-pay

## 2022-11-10 ENCOUNTER — Ambulatory Visit (INDEPENDENT_AMBULATORY_CARE_PROVIDER_SITE_OTHER): Payer: 59 | Admitting: Licensed Clinical Social Worker

## 2022-11-10 DIAGNOSIS — F3341 Major depressive disorder, recurrent, in partial remission: Secondary | ICD-10-CM

## 2022-11-10 NOTE — Progress Notes (Addendum)
Virtual Visit via Video Shaw  I connected with Kristy Shaw on 11/10/22 at  8:00 AM EST by a video enabled telemedicine application and verified that I am speaking with the correct person using two identifiers.   Location: Patient: home Provider: remote office [place], [place])   I discussed the limitations of evaluation and management by telemedicine and the availability of in person appointments. The patient expressed understanding and agreed to proceed.   I discussed the assessment and treatment plan with the patient. The patient was provided an opportunity to ask questions and all were answered. The patient agreed with the plan and demonstrated an understanding of the instructions.   The patient was advised to call back or seek an in-person evaluation if the symptoms worsen or if the condition fails to improve as anticipated.  I provided 45 minutes of non-face-to-face time during this encounter.   Kristy Shaw R Francille Wittmann, LCSW   THERAPIST PROGRESS Shaw  Session Time: [phone]  Participation Level: Active  Behavioral Response: Neat and Well GroomedAlertAnxious  Type of Therapy: Individual Therapy  Treatment Goals addressed: Problem: Decrease depressive symptoms and improve levels of effective functioning Goal: LTG: Reduce frequency, intensity, and duration of depression symptoms as evidenced by: pt self report Outcome: Progressing Goal: STG: Kristy Shaw WILL PARTICIPATE IN AT LEAST 80% OF SCHEDULED INDIVIDUAL PSYCHOTHERAPY SESSIONS Outcome: Progressing Intervention: REVIEW PLEASE SKILLS (TREAT PHYSICAL ILLNESS, BALANCE EATING, AVOID MOOD-ALTERING SUBSTANCES, BALANCE SLEEP AND GET EXERCISE) WITH Kristy Shaw: Reviewed Intervention: Encourage verbalization of feelings/concerns/expectations Shaw: Allowed/explored Intervention: Encourage self-care activities Shaw: Encouraged   Problem: Reduce overall frequency, intensity, and duration of the anxiety so that daily functioning is not  impaired Goal: LTG: Patient will score less than 5 on the Generalized Anxiety Disorder 7 Scale (GAD-7) Outcome: Progressing Goal: STG: Patient will participate in at least 80% of scheduled individual psychotherapy sessions Outcome: Progressing Intervention: Encourage patient to identify triggers Shaw: Assisted with identification Intervention: Assist with relaxation techniques, as appropriate (deep breathing exercises, meditation, guided imagery) Shaw: Reviewed Intervention: Assist with coping skills and behavior    ProgressTowards Goals: Progressing  Interventions: CBT and Solution Focused  Summary: Kristy Shaw is a 46 y.o. female who presents with improving symptoms related to depression and anxiety.  Pt reports that mood has been stable and that she is managing stress and anxiety well. Pt reporting good quality and quantity of sleep.   Allowed pt to explore and express thoughts and feelings associated with recent life situations and external stressors.  patient reports that she has some ongoing stress associated with a recent conflict between herself and her best friend of 35 years. Patient reports that this is a friend that she has gone to her home and helped her clean multiple times--but patient feels that this friend has not been there for her in her time of need. Patient reports that she had a conversation with her friend about laundry at her friend's house--and her friend took it personally. Patient reports that her friend sent a really long text message with very hurtful, name calling comments. Patient reports that this has triggered a lot of anger inside of her, and she is working hard to regulate her emotions and not lash out at her friend. Patient feels that she doesn't want to not have the last word in this situation. Patient states that her friend made the comment that she feels that patient is being insensitive to her loss (recently lost her father). Allowed patient to explore  her feelings that this  situation has triggered, and reviewed emotional regulation strategies. Continue to reinforce the importance of life balance and self-care.  Discuss patients relationship with sister--patient feels that her sister is codependent on her parents, and with everyone. Patient reports that her parents are raising sisters 52 year old daughter. Patient reports that her sister has anxiety, OCD, ADD, and bipolar disorder.  Patient reports that her overall health is OK--having some pain in her shoulder, and having some dental pain from having a tooth removed.  Continued recommendations are as follows: self care behaviors, positive social engagements, focusing on overall work/home/life balance, and focusing on positive physical and emotional wellness.   Suicidal/Homicidal: No  Therapist Response: Pt is continuing to apply interventions learned in session into daily life situations. Pt is currently on track to meet goals utilizing interventions mentioned above. Personal growth and progress noted. Treatment to continue as indicated.   Plan: Return again in 4 weeks.  Diagnosis:  Encounter Diagnosis  Name Primary?   MDD (major depressive disorder), recurrent, in partial remission (HCC) Yes    Collaboration of Care: Other Pt encouraged to continue follow up appointments with psychiatrist--Dr. [name]  Patient/Guardian was advised Release of Information must be obtained prior to any record release in order to collaborate their care with an outside provider. Patient/Guardian was advised if they have not already done so to contact the registration department to sign all necessary forms in order for [place] to release information regarding their care.   Consent: Patient/Guardian gives verbal consent for treatment and assignment of benefits for services provided during this visit. Patient/Guardian expressed understanding and agreed to proceed.   Kristy Shaw Kristy Wiltsey, LCSW 11/10/2022

## 2022-12-27 ENCOUNTER — Ambulatory Visit (INDEPENDENT_AMBULATORY_CARE_PROVIDER_SITE_OTHER): Payer: 59 | Admitting: Licensed Clinical Social Worker

## 2022-12-27 DIAGNOSIS — F3341 Major depressive disorder, recurrent, in partial remission: Secondary | ICD-10-CM

## 2022-12-27 DIAGNOSIS — F418 Other specified anxiety disorders: Secondary | ICD-10-CM

## 2022-12-27 NOTE — Progress Notes (Signed)
Virtual Visit via Video Note  I connected with Kristy Shaw on 12/27/22 at 10:00 AM EST by a video enabled telemedicine application and verified that I am speaking with the correct person using two identifiers.   Location: Patient: home Provider: remote office [place], [place])   I discussed the limitations of evaluation and management by telemedicine and the availability of in person appointments. The patient expressed understanding and agreed to proceed.   I discussed the assessment and treatment plan with the patient. The patient was provided an opportunity to ask questions and all were answered. The patient agreed with the plan and demonstrated an understanding of the instructions.   The patient was advised to call back or seek an in-person evaluation if the symptoms worsen or if the condition fails to improve as anticipated.  I provided 60 minutes of non-face-to-face time during this encounter.   Keigan Girten R Telesa Jeancharles, LCSW   THERAPIST PROGRESS NOTE  Session Time: 10-11a  Participation Level: Active  Behavioral Response: Neat and Well GroomedAlertAnxious  Type of Therapy: Individual Therapy  Treatment Goals addressed: Decrease depressive symptoms and improve levels of effective functioning-pt reports a decrease in overall depression symptoms 3 out of 5 sessions documented   Reduce overall frequency, intensity, and duration of the anxiety so that daily functioning is not impaired per pt self report 3 out of 5 sessions documented   ProgressTowards Goals: Progressing  Interventions: CBT and Solution Focused  Summary: Kristy Shaw is a 47 y.o. female who presents with improving symptoms related to depression and anxiety.  Pt reports that mood has been stable and that she is managing stress and anxiety well. Pt reporting good quality and quantity of sleep.   Allowed pt to explore and express thoughts and feelings associated with recent life situations and external  stressors.Patient reports that she was experiencing some stress associated with having the flu before Christmas. Patient reports that her daughter also got the flu, and patient was not happy with the medical treatment that she received from her doctor.  Patient reports that she is feeling better now, and is continuing to be compliant with her medication. Patient reports that overall mood is stable, and that she is continuing to experience stress and anxiety triggered over situational stressors.  Explored patient's relationship with her sister patient reports that she felt disappointed during Christmas when her sister showed up at the nieces recital (pt sister's daughter) with a new boyfriend and his children. Family members were happy that she did come, however patient states that her sister did not wait until her daughter came back out into the audience to talk with her at all. Patient reports that her sister, sister's boyfriend, and sister's kids left at the end of the program without speaking to her daughter. Patient reports that her niece was upset about this, and feels like her mother cares more about her boyfriend's children than her. Patient reports that she is trying hard to support the niece emotionally, and that her parents are trying hard to support the niece emotionally.  Patient reports that she is continuing to struggle with the recent estrangement from herself and her best friend. Allowed patient safe space to explore this relationship, and the different scenarios that happened prior to their argument/estrangement. Patient reports that having this time out in the relationship allows her to see her friend from a certain perspective. Discussed unconditional acceptance of others, whether it's friendships or relationships. Patient understands and is questioning whether she wants to continue  the relationship with her friend, or if she is just "bored" and in need of stimulation.  Patient feels that  her best friend thrives in chaos, and will stir up drama even when things are peaceful.  Continued recommendations are as follows: self care behaviors, positive social engagements, focusing on overall work/home/life balance, and focusing on positive physical and emotional wellness.   Suicidal/Homicidal: No  Therapist Response: Pt is continuing to apply interventions learned in session into daily life situations. Pt is currently on track to meet goals utilizing interventions mentioned above. Personal growth and progress noted. Treatment to continue as indicated.   Plan: Return again in 4 weeks.  Diagnosis:  Encounter Diagnoses  Name Primary?   MDD (major depressive disorder), recurrent, in partial remission (HCC) Yes   Other specified anxiety disorders     Collaboration of Care: Other Pt encouraged to continue follow up appointments with psychiatrist--Dr. [name]  Patient/Guardian was advised Release of Information must be obtained prior to any record release in order to collaborate their care with an outside provider. Patient/Guardian was advised if they have not already done so to contact the registration department to sign all necessary forms in order for [place] to release information regarding their care.   Consent: Patient/Guardian gives verbal consent for treatment and assignment of benefits for services provided during this visit. Patient/Guardian expressed understanding and agreed to proceed.   Kristy Haber Caridad Silveira, LCSW 12/27/2022

## 2022-12-29 ENCOUNTER — Ambulatory Visit: Payer: 59 | Admitting: Psychiatry

## 2022-12-29 ENCOUNTER — Ambulatory Visit
Admission: RE | Admit: 2022-12-29 | Discharge: 2022-12-29 | Disposition: A | Discharge status: Home / Self Care | Payer: 59 | Source: Ambulatory Visit

## 2022-12-29 DIAGNOSIS — Z1231 Encounter for screening mammogram for malignant neoplasm of breast: Secondary | ICD-10-CM

## 2022-12-30 ENCOUNTER — Other Ambulatory Visit: Payer: Self-pay | Admitting: Internal Medicine

## 2022-12-30 DIAGNOSIS — R928 Other abnormal and inconclusive findings on diagnostic imaging of breast: Secondary | ICD-10-CM

## 2023-01-06 ENCOUNTER — Ambulatory Visit
Admission: RE | Admit: 2023-01-06 | Discharge: 2023-01-06 | Disposition: A | Discharge status: Home / Self Care | Payer: 59 | Source: Ambulatory Visit | Attending: Internal Medicine | Admitting: Internal Medicine

## 2023-01-06 ENCOUNTER — Ambulatory Visit: Payer: 59

## 2023-01-06 DIAGNOSIS — R928 Other abnormal and inconclusive findings on diagnostic imaging of breast: Secondary | ICD-10-CM

## 2023-01-24 ENCOUNTER — Ambulatory Visit (INDEPENDENT_AMBULATORY_CARE_PROVIDER_SITE_OTHER): Payer: 59 | Admitting: Licensed Clinical Social Worker

## 2023-01-24 DIAGNOSIS — F3341 Major depressive disorder, recurrent, in partial remission: Secondary | ICD-10-CM

## 2023-01-24 NOTE — Progress Notes (Signed)
Virtual Visit via Video Note  I connected with Kristy Shaw on 01/24/23 at 10:00 AM EST by a video enabled telemedicine application and verified that I am speaking with the correct person using two identifiers.   Location: Patient: home Provider: remote office [place], [place])   I discussed the limitations of evaluation and management by telemedicine and the availability of in person appointments. The patient expressed understanding and agreed to proceed.   I discussed the assessment and treatment plan with the patient. The patient was provided an opportunity to ask questions and all were answered. The patient agreed with the plan and demonstrated an understanding of the instructions.   The patient was advised to call back or seek an in-person evaluation if the symptoms worsen or if the condition fails to improve as anticipated.  I provided 60 minutes of non-face-to-face time during this encounter.   Kevante Lunt R Diamond Jentz, LCSW   THERAPIST PROGRESS NOTE  Session Time: 10-11a  Participation Level: Active  Behavioral Response: Neat and Well GroomedAlertAnxious  Type of Therapy: Individual Therapy  Treatment Goals addressed: Decrease depressive symptoms and improve levels of effective functioning-pt reports a decrease in overall depression symptoms 3 out of 5 sessions documented   Reduce overall frequency, intensity, and duration of the anxiety so that daily functioning is not impaired per pt self report 3 out of 5 sessions documented   ProgressTowards Goals: Progressing  Interventions: CBT and Solution Focused  Summary: Kristy Shaw is a 47 y.o. female who presents with improving symptoms related to depression and anxiety.  Patient reports that overall mood has been stable and that she feels like she is managing situational stressors well.  Allowed pt to explore and express thoughts and feelings associated with recent life situations and external stressors.  Patient  reports that she had significant dental pain recently due to an extraction/exposed bone.  Patient reports that during that time she experienced significant pain, but is feeling much better now  Explored patient concerns regarding behavior that patient feels is impulsive.  Patient states that she made some hearings for her daughter, and loved it so much she made the decision to start making her own jewelry.  Patient reports that she spent money to make jewelry, and possibly sell jewelry on [employer].  Patient reports that when she is making the jewelry it makes her feel happy and it is a way to explore her creativity.  Patient reports that her husband is ambivalent about it, and has made comments about it possibly taking up time that they spend together Patient reports that she is continuing to seek a part-time job.  Allow patient to identify barriers to finding a job.    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  Brainstormed with the patient various positions that meet her criteria.  Allow patient to explore life review, and discussed priorities when she was in her 60s versus current priorities.  Allow patient to identify strengths at that time and current strengths.  Patient reports that her current husband is a big support system for her, and his support is considered a strength to her as well.  Patient expresses excitement about a potential promotion and raise and her husband could be eligible for.  He will find out in 1 week.  Patient reports that outside of her home she does not socialize a lot with  others--encouraged patient to get positive social engagement with the people that she does have in her social circle.  Discussed continuing estrangement between patient and her best friend.  Allow patient to explore her thoughts and feelings about the situation and patient feels that the relationship was not  a reciprocal one, so patient is fine with maintaining relationship as it is currently.  Continued recommendations are as follows: self care behaviors, positive social engagements, focusing on overall work/home/life balance, and focusing on positive physical and emotional wellness.   Suicidal/Homicidal: No  Therapist Response: Pt is continuing to apply interventions learned in session into daily life situations. Pt is currently on track to meet goals utilizing interventions mentioned above. Personal growth and progress noted. Treatment to continue as indicated.   Plan: Return again in 4 weeks.  Diagnosis:  Encounter Diagnosis  Name Primary?   MDD (major depressive disorder), recurrent, in partial remission ([place]) Yes    Collaboration of Care: Other Pt encouraged to continue follow up appointments with psychiatrist--Dr. Shea Evans  Patient/Guardian was advised Release of Information must be obtained prior to any record release in order to collaborate their care with an outside provider. Patient/Guardian was advised if they have not already done so to contact the registration department to sign all necessary forms in order for [place] to release information regarding their care.   Consent: Patient/Guardian gives verbal consent for treatment and assignment of benefits for services provided during this visit. Patient/Guardian expressed understanding and agreed to proceed.   [place], LCSW 01/24/2023

## 2023-01-26 ENCOUNTER — Ambulatory Visit (INDEPENDENT_AMBULATORY_CARE_PROVIDER_SITE_OTHER): Payer: 59 | Admitting: Psychiatry

## 2023-01-26 ENCOUNTER — Encounter: Payer: Self-pay | Admitting: Psychiatry

## 2023-01-26 VITALS — BP 118/73 | HR 73 | Temp 98.9°F | Ht 68.5 in | Wt 155.0 lb

## 2023-01-26 DIAGNOSIS — F418 Other specified anxiety disorders: Secondary | ICD-10-CM

## 2023-01-26 DIAGNOSIS — F3342 Major depressive disorder, recurrent, in full remission: Secondary | ICD-10-CM | POA: Diagnosis not present

## 2023-01-26 DIAGNOSIS — G479 Sleep disorder, unspecified: Secondary | ICD-10-CM

## 2023-01-26 DIAGNOSIS — F1590 Other stimulant use, unspecified, uncomplicated: Secondary | ICD-10-CM

## 2023-01-26 DIAGNOSIS — F17200 Nicotine dependence, unspecified, uncomplicated: Secondary | ICD-10-CM | POA: Diagnosis not present

## 2023-01-26 DIAGNOSIS — Z79899 Other long term (current) drug therapy: Secondary | ICD-10-CM

## 2023-01-26 MED ORDER — GABAPENTIN 300 MG PO CAPS: 900.0000 mg | ORAL_CAPSULE | ORAL | 0 refills | Status: DC

## 2023-01-26 MED ORDER — ARIPIPRAZOLE 2 MG PO TABS: 2.0000 mg | ORAL_TABLET | Freq: Every day | ORAL | 0 refills | Status: DC

## 2023-01-26 MED ORDER — VENLAFAXINE HCL ER 75 MG PO CP24: ORAL_CAPSULE | ORAL | 0 refills | Status: DC

## 2023-01-26 MED ORDER — VENLAFAXINE HCL ER 150 MG PO CP24: 150.0000 mg | ORAL_CAPSULE | Freq: Every day | ORAL | 0 refills | Status: DC
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
## 2023-02-21 ENCOUNTER — Ambulatory Visit (INDEPENDENT_AMBULATORY_CARE_PROVIDER_SITE_OTHER): Payer: 59 | Admitting: Licensed Clinical Social Worker

## 2023-02-21 DIAGNOSIS — F3341 Major depressive disorder, recurrent, in partial remission: Secondary | ICD-10-CM | POA: Diagnosis not present

## 2023-02-21 DIAGNOSIS — F418 Other specified anxiety disorders: Secondary | ICD-10-CM | POA: Diagnosis not present

## 2023-02-21 NOTE — Progress Notes (Signed)
Virtual Visit via Video Note  I connected with Kristy Shaw on 02/21/23 at 10:00 AM EST by a video enabled telemedicine application and verified that I am speaking with the correct person using two identifiers.   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 The patient expressed understanding and agreed to proceed.   I discussed the assessment and treatment plan with the patient. The patient was provided an opportunity to ask questions and all were answered. The patient agreed with the plan and demonstrated an understanding of the instructions.   The patient was advised to call back or seek an in-person evaluation if the symptoms worsen or if the condition fails to improve as anticipated.  I provided 35 minutes of non-face-to-face time during this encounter.   Teretha Chalupa R Abigayle Wilinski, LCSW   THERAPIST PROGRESS NOTE  Session Time: 10-1035a  Participation Level: Active  Behavioral Response: Neat and Well GroomedAlertAnxious  Type of Therapy: Individual Therapy  Treatment Goals addressed: Decrease depressive symptoms and improve levels of effective functioning-pt reports a decrease in overall depression symptoms 3 out of 5 sessions documented   Reduce overall frequency, intensity, and duration of the anxiety so that daily functioning is not impaired per pt self report 3 out of 5 sessions documented   ProgressTowards Goals: Progressing  Interventions: CBT and Solution Focused  Summary: Kristy Shaw is a 47 y.o. female who presents with improving symptoms related to depression and anxiety.  Patient reports that depression symptoms have decreased since last session, and patient feels that her anxiety symptoms have increased slightly, but triggered by situational stressors going on currently.  Patient reports good  quality and quantity of sleep.  Patient reports that she has been compliant with her psychiatric appointments.  Allowed pt to explore and express thoughts and feelings associated with recent life situations and external stressors.  Patient reports that she is currently adjusting to her new full-time work position.  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  Patient reports it is quite an adjustment since she is working a full-time schedule.  Patient reports another major stressor is finding out the information that her father has an aortic aneurysm, and possible cancer.  Patient states that he needs further assessments to figure out next steps or treatment.  Patient states that she is the only person that has this information--parents have not disclosed it to any other siblings or family members at this time.  Patient reports that relationships with with family members are going well.  Clinician assessed medication compliance, and medication efficiency.  Clinician continue to encourage patient to engage in self-care activities, especially now that she has less time for herself throughout the day.  Encouraged patient to identify anxiety triggers, and to manage symptoms in the moment.  Reviewed stress management techniques  Continued recommendations are as follows: self care behaviors, positive social engagements, focusing on overall work/home/life balance, and focusing on positive physical and emotional wellness.   Suicidal/Homicidal: No  Therapist Response: Pt is continuing to apply interventions learned in session into daily life situations. Pt is currently on track to meet goals utilizing interventions mentioned above. Personal growth and progress noted. Treatment to continue as indicated.   Patient continues to make good progress with overall identification of anxiety triggers, developments are continuing in the areas of occupational functioning and achievement,  developments continue in the area of overall self-care and life management, and patient is continuing to improve overall setting limits and boundaries with others.  Plan: Return again in 4 weeks.  Diagnosis:  Encounter Diagnoses  Name Primary?   MDD (major depressive disorder), recurrent, in partial remission ([place]) Yes   Other specified anxiety disorders      Collaboration of Care: Other Pt encouraged to continue follow up appointments with psychiatrist--Dr. Shea Evans  Patient/Guardian was advised Release of Information must be obtained prior to any record release in order to collaborate their care with an outside provider. Patient/Guardian was advised if they have not already done so to contact the registration department to sign all necessary forms in order for [place] to release information regarding their care.   Consent: Patient/Guardian gives verbal consent for treatment and assignment of benefits for services provided during this visit. Patient/Guardian expressed understanding and agreed to proceed.   [place], LCSW 02/21/2023

## 2023-02-22 ENCOUNTER — Ambulatory Visit: Payer: 59 | Admitting: Dermatology

## 2023-03-18 ENCOUNTER — Other Ambulatory Visit: Payer: Self-pay | Admitting: Dermatology

## 2023-03-18 DIAGNOSIS — L2089 Other atopic dermatitis: Secondary | ICD-10-CM

## 2023-03-21 ENCOUNTER — Ambulatory Visit (INDEPENDENT_AMBULATORY_CARE_PROVIDER_SITE_OTHER): Payer: 59 | Admitting: Licensed Clinical Social Worker

## 2023-03-21 DIAGNOSIS — F3341 Major depressive disorder, recurrent, in partial remission: Secondary | ICD-10-CM | POA: Diagnosis not present

## 2023-03-21 DIAGNOSIS — F419 Anxiety disorder, unspecified: Secondary | ICD-10-CM

## 2023-03-21 NOTE — Progress Notes (Signed)
Virtual Visit via Video Note  I connected with Vianne Ceasar Beilke on 03/21/23 at  4:00 PM EDT by a video enabled telemedicine application and verified that I am speaking with the correct person using two identifiers.   Location: Patient: personal vehicle (work break) Secondary school teacher: remote office ([place], [place])   I discussed the limitations of evaluation and management by telemedicine and the availability of in person appointments. The patient expressed understanding and agreed to proceed.   I discussed the assessment and treatment plan with the patient. The patient was provided an opportunity to ask questions and all were answered. The patient agreed with the plan and demonstrated an understanding of the instructions.   The patient was advised to call back or seek an in-person evaluation if the symptoms worsen or if the condition fails to improve as anticipated.  I provided 35 minutes of non-face-to-face time during this encounter.   Kamiryn Bezanson R Brunette Lavalle, LCSW   THERAPIST PROGRESS NOTE  Session Time: 10-1035a  Participation Level: Active  Behavioral Response: Neat and Well GroomedAlertAnxious  Type of Therapy: Individual Therapy  Treatment Goals addressed: Decrease depressive symptoms and improve levels of effective functioning-pt reports a decrease in overall depression symptoms 3 out of 5 sessions documented   Reduce overall frequency, intensity, and duration of the anxiety so that daily functioning is not impaired per pt self report 3 out of 5 sessions documented   ProgressTowards Goals: Progressing  Interventions: CBT and Solution Focused  Summary: KEMARA SAKS is a 47 y.o. female who presents with improving symptoms related to depression and anxiety.  Patient reports that depression symptoms have decreased since last session, and patient feels that her anxiety symptoms have increased slightly, but triggered by situational stressors going on currently.  Patient reports good  quality and quantity of sleep.  Patient reports that she has been compliant with her psychiatric appointments.  Allowed pt to explore and express thoughts and feelings associated with recent life situations and external stressors.  Patient reports that she is currently adjusting to her new full-time work position.  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  Patient reports it is quite an adjustment since she is working a full-time schedule.  Patient reports another major stressor is finding out the information that her father has an aortic aneurysm, and possible cancer.  Patient states that he needs further assessments to figure out next steps or treatment.  Patient states that she is the only person that has this information--parents have not disclosed it to any other siblings or family members at this time.  Patient reports that relationships with with family members are going well.  Clinician assessed medication compliance, and medication efficiency.  Clinician continue to encourage patient to engage in self-care activities, especially now that she has less time for herself throughout the day.  Encouraged patient to identify anxiety triggers, and to manage symptoms in the moment.  Reviewed stress management techniques  Continued recommendations are as follows: self care behaviors, positive social engagements, focusing on overall work/home/life balance, and focusing on positive physical and emotional wellness.   Suicidal/Homicidal: No  Therapist Response: Pt is continuing to apply interventions learned in session into daily life situations. Pt is currently on track to meet goals utilizing interventions mentioned above. Personal growth and progress noted. Treatment to continue as indicated.   Father has pancreatic cancer. Having surgery. Having issues with his heart. Aortic aneurysm.   Wed. He's having anerysm repair.  Worrying about dad having to do chemo for  that long  Macular degeneration  Detached retina  Might not be able to work--put a strain on my mom and dad who takes care of sister's daughter  Daughtter (little mary). Only child and has tendancies that are like her mother  Good to go back to work. Exhausted when I get home. Dinnertime. Dtr has dance 2 x per week.   Eating late==husband likes to cook. Making jewelry in spare time  Had to put cat to sleep  Macular deg;eration  Plan: Return again in 4 weeks.  Diagnosis:  No diagnosis found.  Smoking: think I'm okay work wise.    Collaboration of Care: Other Pt encouraged to continue follow up appointments with psychiatrist--Dr. Shea Evans  Patient/Guardian was advised Release of Information must be obtained prior to any record release in order to collaborate their care with an outside provider. Patient/Guardian was advised if they have not already done so to contact the registration department to sign all necessary forms in order for [place] to release information regarding their care.   Consent: Patient/Guardian gives verbal consent for treatment and assignment of benefits for services provided during this visit. Patient/Guardian expressed understanding and agreed to proceed.   [place], LCSW 03/21/2023

## 2023-04-08 ENCOUNTER — Other Ambulatory Visit: Payer: Self-pay | Admitting: Dermatology

## 2023-04-08 DIAGNOSIS — L2089 Other atopic dermatitis: Secondary | ICD-10-CM

## 2023-04-19 ENCOUNTER — Ambulatory Visit: Payer: 59 | Admitting: Dermatology

## 2023-04-19 ENCOUNTER — Encounter: Payer: Self-pay | Admitting: Dermatology

## 2023-04-19 DIAGNOSIS — L2089 Other atopic dermatitis: Secondary | ICD-10-CM | POA: Diagnosis not present

## 2023-04-19 DIAGNOSIS — L720 Epidermal cyst: Secondary | ICD-10-CM | POA: Diagnosis not present

## 2023-04-19 DIAGNOSIS — Z79899 Other long term (current) drug therapy: Secondary | ICD-10-CM | POA: Diagnosis not present

## 2023-04-19 DIAGNOSIS — L729 Follicular cyst of the skin and subcutaneous tissue, unspecified: Secondary | ICD-10-CM

## 2023-04-19 MED ORDER — DUPIXENT 300 MG/2ML ~~LOC~~ SOSY: PREFILLED_SYRINGE | SUBCUTANEOUS | 5 refills | Status: DC

## 2023-04-19 NOTE — Patient Instructions (Signed)
Dupilumab (Dupixent) is a treatment given by injection for adults and children with moderate-to-severe atopic dermatitis. Goal is control of skin condition, not cure. It is given as 2 injections at the first dose followed by 1 injection ever 2 weeks thereafter.  Young children are dosed monthly.  Potential side effects include allergic reaction, herpes infections, injection site reactions and conjunctivitis (inflammation of the eyes).  The use of Dupixent requires long term medication management, including periodic office visits.    Due to recent changes in healthcare laws, you may see results of your pathology and/or laboratory studies on MyChart before the doctors have had a chance to review them. We understand that in some cases there may be results that are confusing or concerning to you. Please understand that not all results are received at the same time and often the doctors may need to interpret multiple results in order to provide you with the best plan of care or course of treatment. Therefore, we ask that you please give us 2 business days to thoroughly review all your results before contacting the office for clarification. Should we see a critical lab result, you will be contacted sooner.   If You Need Anything After Your Visit  If you have any questions or concerns for your doctor, please call our main line at [phone] and press option 4 to reach your doctor's medical assistant. If no one answers, please leave a voicemail as directed and we will return your call as soon as possible. Messages left after 4 pm will be answered the following business day.   You may also send us a message via MyChart. We typically respond to MyChart messages within 1-2 business days.  For prescription refills, please ask your pharmacy to contact our office. Our fax number is [phone].  If you have an urgent issue when the clinic is closed that cannot wait until the next business day, you can page  your doctor at the number below.    Please note that while we do our best to be available for urgent issues outside of office hours, we are not available 24/7.   If you have an urgent issue and are unable to reach us, you may choose to seek medical care at your doctor's office, retail clinic, urgent care center, or emergency room.  If you have a medical emergency, please immediately call 911 or go to the emergency department.  Pager Numbers  - Dr. Kowalski: [phone]  - Dr. Moye: [phone]  - Dr. Stewart: [phone]  In the event of inclement weather, please call our main line at [phone] for an update on the status of any delays or closures.  Dermatology Medication Tips: Please keep the boxes that topical medications come in in order to help keep track of the instructions about where and how to use these. Pharmacies typically print the medication instructions only on the boxes and not directly on the medication tubes.   If your medication is too expensive, please contact our office at [phone] option 4 or send us a message through MyChart.   We are unable to tell what your co-pay for medications will be in advance as this is different depending on your insurance coverage. However, we may be able to find a substitute medication at lower cost or fill out paperwork to get insurance to cover a needed medication.   If a prior authorization is required to get your medication covered by your insurance company, please allow us 1-2 business days to   process.  Drug prices often vary depending on where the prescription is filled and some pharmacies may offer cheaper prices.  The website www.goodrx.com contains coupons for medications through different pharmacies. The prices here do not account for what the cost may be with help from insurance (it may be cheaper with your insurance), but the website can give you the price if you did not use any insurance.  - You can print  the associated coupon and take it with your prescription to the pharmacy.  - You may also stop by our office during regular business hours and pick up a GoodRx coupon card.  - If you need your prescription sent electronically to a different pharmacy, notify our office through [facility] or by phone at [phone] option 4.

## 2023-04-19 NOTE — Progress Notes (Signed)
   Follow-Up Visit   Subjective  Kristy Shaw is a 47 y.o. female who presents for the following: Atopic Dermatitis/Dupixent follow up. Patient advises it is rare for her to need to use topicals for itching. She does have mild tenderness and pinkness at injection site lasting < 24 hours.  Patient would like cyst at labia rechecked. Has grown a little.   The following portions of the chart were reviewed this encounter and updated as appropriate: medications, allergies, medical history  Review of Systems:  No other skin or systemic complaints except as noted in HPI or Assessment and Plan.  Objective  Well appearing patient in no apparent distress; mood and affect are within normal limits.   A focused examination was performed of the following areas: Arms, legs  Relevant exam findings are noted in the Assessment and Plan.    Assessment & Plan   ATOPIC DERMATITIS Exam: clear at legs and arms, with mild xerosis  Chronic condition with duration or expected duration over one year. Currently well-controlled.   Atopic dermatitis - Severe, on Dupixent (biologic medication).  Atopic dermatitis (eczema) is a chronic, relapsing, pruritic condition that can significantly affect quality of life. It is often associated with allergic rhinitis and/or asthma and can require treatment with topical medications, phototherapy, or in severe cases biologic medications, which require long term medication management.    Treatment Plan: Recommend TMC 0.1% cream to spot treat injection sites as needed. Avoid applying to face, groin, and axilla. Use as directed. Long-term use can cause thinning of the skin. Continue tacrolimus ointment 0.1 % - apply topically to aa of face and body bid prn for itchy spots  Continue Dupixent injections q 2 weeks  Continue clobetasol/CeraVe mix qd/bid or Clobetasol ointment prn flares. Avoid face, groin, axilla.   Recommend gentle skin care.  EPIDERMAL INCLUSION  CYST Exam: 1.0 cm firm subcutaneous nodule with punctum at right labia/groin  Benign-appearing. Exam most consistent with an epidermal inclusion cyst. Discussed that a cyst is a benign growth that can grow over time and sometimes get irritated or inflamed. Recommend observation if it is not bothersome. Discussed option of surgical excision to remove it if it is growing, symptomatic, or other changes noted. Please call for new or changing lesions so they can be evaluated.    Other atopic dermatitis  Related Medications dupilumab (DUPIXENT) 300 MG/2ML prefilled syringe INJECT 1 SYRINGE (300MG ) SUBCUTANEOUSLY EVERY 14 DAYS.    Return in about 6 months (around 10/19/2023) for Dermatitis, Dupixent.  Anise Salvo, RMA, am acting as scribe for Willeen Niece, MD .   Documentation: I have reviewed the above documentation for accuracy and completeness, and I agree with the above.  Willeen Niece, MD

## 2023-04-21 ENCOUNTER — Ambulatory Visit (INDEPENDENT_AMBULATORY_CARE_PROVIDER_SITE_OTHER): Payer: 59 | Admitting: Licensed Clinical Social Worker

## 2023-04-21 DIAGNOSIS — F3341 Major depressive disorder, recurrent, in partial remission: Secondary | ICD-10-CM | POA: Diagnosis not present

## 2023-04-21 DIAGNOSIS — F419 Anxiety disorder, unspecified: Secondary | ICD-10-CM | POA: Diagnosis not present

## 2023-04-21 NOTE — Progress Notes (Signed)
Virtual Visit via Video Note  I connected with Kristy Shaw on 04/21/23 at  4:00 PM EDT by a video enabled telemedicine application and verified that I am speaking with the correct person using two identifiers.   Location: Patient: personal vehicle  Provider: remote office [place], [place])   I discussed the limitations of evaluation and management by telemedicine and the availability of in person appointments. The patient expressed understanding and agreed to proceed.   I discussed the assessment and treatment plan with the patient. The patient was provided an opportunity to ask questions and all were answered. The patient agreed with the plan and demonstrated an understanding of the instructions.   The patient was advised to call back or seek an in-person evaluation if the symptoms worsen or if the condition fails to improve as anticipated.  I provided 40 minutes of non-face-to-face time during this encounter.   Paddy Neis R Sheniya Garciaperez, LCSW   THERAPIST PROGRESS NOTE  Session Time: 69-440p  Participation Level: Active  Behavioral Response: Neat and Well GroomedAlertAnxious  Type of Therapy: Individual Therapy  Treatment Goals addressed: Decrease depressive symptoms and improve levels of effective functioning-pt reports a decrease in overall depression symptoms 3 out of 5 sessions documented   Reduce overall frequency, intensity, and duration of the anxiety so that daily functioning is not impaired per pt self report 3 out of 5 sessions documented   ProgressTowards Goals: Progressing  Interventions: CBT and Solution Focused  Summary: Kristy Shaw is a 47 y.o. female who presents with improving symptoms related to depression and anxiety. Pt reports that overall mood is stable and that she is managing situational stressors well.    Explored current family-based issues/concerns. Pt reports that one of the main anxiety triggers related to family relationships are improving w/  sister. Pt states sister currently residing with boyfriend and still coming around wanting to eat dinners with pts family when she's cooked--pt feels her sister is taking advantage of her. . Discussed current ways that pt and family members are communicating, and discussed positive changes. Discussed current conflict resolution and problem solving behaviors and discussed changes that could improve future conflict/problem solving with family members. Pt reflects understanding and is cooperative.  . Assisted pt with identifying external stressors and ways that pt has been coping with the stress. Reviewed stress management strategies. Pt reflects understanding and willingness to cooperate and make positive changes. Pt identifies fathers pancreatic cancer as a primary stressor. Pt is worried about her father, mother, and them taking care of pts niece (little Corrie Dandy).   Explored patients overall focus on wellness. Discussed emotional wellness and included discussions about improving overall work/life balance and improvements in self care. Discussed physical wellness and included discussions about improving physical activity (at pts ability level) and reviewed current nutritional choices and reviewed healthy nutritional choices. Allowed pt to explore what changes and improvements that they feel would be good personal goals without feeling too overwhelmed. Pt committed to change. Pt has goals to lose weight and make healthier choices.  Continued recommendations are as follows: self care behaviors, positive social engagements, focusing on overall work/home/life balance, and focusing on positive physical and emotional wellness.   Suicidal/Homicidal: No  Therapist Response: Pt is continuing to apply interventions learned in session into daily life situations. Pt is currently on track to meet goals utilizing interventions mentioned above. Personal growth and progress noted. Treatment to continue as indicated.    Patient is continuing to do a great job of identifying  triggers and utilizing coping skills to manage symptoms in the moment.  Plan: Return again in 4 weeks.  Diagnosis:  Encounter Diagnoses  Name Primary?   MDD (major depressive disorder), recurrent, in partial remission (HCC) Yes   Anxiety disorder, unspecified type     Collaboration of Care: Other Pt encouraged to continue follow up appointments with psychiatrist--Dr. [name]  Patient/Guardian was advised Release of Information must be obtained prior to any record release in order to collaborate their care with an outside provider. Patient/Guardian was advised if they have not already done so to contact the registration department to sign all necessary forms in order for [place] to release information regarding their care.   Consent: Patient/Guardian gives verbal consent for treatment and assignment of benefits for services provided during this visit. Patient/Guardian expressed understanding and agreed to proceed.   Ernest Haber Maribell Demeo, LCSW 04/21/2023

## 2023-04-25 ENCOUNTER — Telehealth (INDEPENDENT_AMBULATORY_CARE_PROVIDER_SITE_OTHER): Payer: 59 | Admitting: Psychiatry

## 2023-04-25 ENCOUNTER — Encounter: Payer: Self-pay | Admitting: Psychiatry

## 2023-04-25 DIAGNOSIS — F1590 Other stimulant use, unspecified, uncomplicated: Secondary | ICD-10-CM

## 2023-04-25 DIAGNOSIS — F17210 Nicotine dependence, cigarettes, uncomplicated: Secondary | ICD-10-CM

## 2023-04-25 DIAGNOSIS — G479 Sleep disorder, unspecified: Secondary | ICD-10-CM

## 2023-04-25 DIAGNOSIS — F418 Other specified anxiety disorders: Secondary | ICD-10-CM

## 2023-04-25 DIAGNOSIS — F3342 Major depressive disorder, recurrent, in full remission: Secondary | ICD-10-CM

## 2023-04-25 DIAGNOSIS — F17200 Nicotine dependence, unspecified, uncomplicated: Secondary | ICD-10-CM

## 2023-04-25 MED ORDER — VENLAFAXINE HCL ER 75 MG PO CP24: ORAL_CAPSULE | ORAL | 0 refills | Status: DC

## 2023-04-25 MED ORDER — ARIPIPRAZOLE 2 MG PO TABS: 2.0000 mg | ORAL_TABLET | Freq: Every day | ORAL | 0 refills | Status: DC

## 2023-04-25 MED ORDER — VENLAFAXINE HCL ER 150 MG PO CP24: 150.0000 mg | ORAL_CAPSULE | Freq: Every day | ORAL | 0 refills | Status: DC

## 2023-04-25 MED ORDER — GABAPENTIN 300 MG PO CAPS: 900.0000 mg | ORAL_CAPSULE | ORAL | 0 refills | Status: DC
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
## 2023-04-26 ENCOUNTER — Encounter: Payer: Self-pay | Admitting: Psychiatry
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
## 2023-05-17 ENCOUNTER — Encounter: Payer: Self-pay | Admitting: Family Medicine

## 2023-05-17 ENCOUNTER — Ambulatory Visit: Payer: 59 | Admitting: Family Medicine

## 2023-05-17 ENCOUNTER — Ambulatory Visit (INDEPENDENT_AMBULATORY_CARE_PROVIDER_SITE_OTHER): Payer: 59

## 2023-05-17 VITALS — BP 122/84 | HR 72 | Ht 68.0 in | Wt 154.0 lb

## 2023-05-17 DIAGNOSIS — M9901 Segmental and somatic dysfunction of cervical region: Secondary | ICD-10-CM

## 2023-05-17 DIAGNOSIS — M549 Dorsalgia, unspecified: Secondary | ICD-10-CM

## 2023-05-17 DIAGNOSIS — M9908 Segmental and somatic dysfunction of rib cage: Secondary | ICD-10-CM | POA: Diagnosis not present

## 2023-05-17 DIAGNOSIS — M542 Cervicalgia: Secondary | ICD-10-CM | POA: Diagnosis not present

## 2023-05-17 DIAGNOSIS — G2589 Other specified extrapyramidal and movement disorders: Secondary | ICD-10-CM | POA: Diagnosis not present

## 2023-05-17 DIAGNOSIS — M9902 Segmental and somatic dysfunction of thoracic region: Secondary | ICD-10-CM | POA: Insufficient documentation

## 2023-05-17 MED ORDER — TIZANIDINE HCL 2 MG PO TABS: 2.0000 mg | ORAL_TABLET | Freq: Every day | ORAL | 0 refills | Status: DC

## 2023-05-17 NOTE — Assessment & Plan Note (Signed)
   Decision today to treat with OMT was based on Physical Exam  After verbal consent patient was treated with HVLA, ME, FPR techniques in cervical, thoracic, rib,areas, all areas are chronic   Patient tolerated the procedure well with improvement in symptoms  Patient given exercises, stretches and lifestyle modifications  See medications in patient instructions if given  Patient will follow up in 4-8 weeks 

## 2023-05-17 NOTE — Patient Instructions (Signed)
Zanaflex 2 mg at night  PT at [facility] on the way out  Scap HEP  7-8 weeks

## 2023-05-17 NOTE — Assessment & Plan Note (Addendum)
Scapular dyskinesis noted.  Will send to formal physical therapy.  Zanaflex given.  Discussed which activities to do and which ones to avoid.  Increase activity slowly.  We discussed formal physical therapy to help her with this.  Very mild scoliosis also noted.  Follow-up with me again in 6 to 8 weeks.

## 2023-05-30 ENCOUNTER — Telehealth: Payer: Self-pay | Admitting: Family Medicine

## 2023-05-30 DIAGNOSIS — M9901 Segmental and somatic dysfunction of cervical region: Secondary | ICD-10-CM

## 2023-05-30 DIAGNOSIS — M542 Cervicalgia: Secondary | ICD-10-CM

## 2023-05-30 NOTE — Telephone Encounter (Signed)
Has been ordered

## 2023-05-30 NOTE — Telephone Encounter (Signed)
Spoke to patient. She would like to proceed with the MRI. Could you order this for me?

## 2023-05-30 NOTE — Telephone Encounter (Signed)
Patient called stating that she was seen by Dr Katrinka Blazing on 05/17/23. She is scheduled to begin PT on July 3rd but she is still having a lot of neck pain, stiffness and trouble turning her head to the right.  She said that she has tried heat, muscle rubs, the muscle relaxer that was prescribed, tylenol, ibuprofen, etc but nothing has helped.  She asked if there was anything else that Dr Katrinka Blazing would recommend at this point?  Please advise.

## 2023-06-08 ENCOUNTER — Ambulatory Visit (INDEPENDENT_AMBULATORY_CARE_PROVIDER_SITE_OTHER): Payer: 59 | Admitting: Licensed Clinical Social Worker

## 2023-06-08 DIAGNOSIS — F3342 Major depressive disorder, recurrent, in full remission: Secondary | ICD-10-CM | POA: Diagnosis not present

## 2023-06-08 DIAGNOSIS — F419 Anxiety disorder, unspecified: Secondary | ICD-10-CM | POA: Diagnosis not present

## 2023-06-08 NOTE — Progress Notes (Unsigned)
Virtual Visit via Video Note  I connected with Kristy Shaw on 06/08/23 at  4:00 PM EDT by a video enabled telemedicine application and verified that I am speaking with the correct person using two identifiers.   Location: Patient: personal vehicle  Provider: remote office [place], [place])   I discussed the limitations of evaluation and management by telemedicine and the availability of in person appointments. The patient expressed understanding and agreed to proceed.   I discussed the assessment and treatment plan with the patient. The patient was provided an opportunity to ask questions and all were answered. The patient agreed with the plan and demonstrated an understanding of the instructions.   The patient was advised to call back or seek an in-person evaluation if the symptoms worsen or if the condition fails to improve as anticipated.  I provided 40 minutes of non-face-to-face time during this encounter.   Katiria Calame R Thersea Manfredonia, LCSW   THERAPIST PROGRESS NOTE  Session Time: 85-440p  Participation Level: Active  Behavioral Response: Neat and Well GroomedAlertAnxious  Type of Therapy: Individual Therapy  Treatment Goals addressed: Decrease depressive symptoms and improve levels of effective functioning-pt reports a decrease in overall depression symptoms 3 out of 5 sessions documented   Reduce overall frequency, intensity, and duration of the anxiety so that daily functioning is not impaired per pt self report 3 out of 5 sessions documented   ProgressTowards Goals: Progressing  Interventions: CBT and Solution Focused  Summary: Kristy Shaw is a 47 y.o. female who presents with improving symptoms related to depression and anxiety. Pt reports that for the most part--relationships have been stable. Pt reports   Depression, anxiety ok. Anger: some episodes. Ticked off about work.   Dad: pancreatic cancer. Not goint the best. Trying not to be stressed. Aunt moved  here--health is declining. Helping her out. "I try to say no".     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 Hasn't been able to do what's needed. 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   Continued recommendations are as follows: self care behaviors, positive social engagements, focusing on overall work/home/life balance, and focusing on positive physical and emotional wellness.   Suicidal/Homicidal: No  Therapist Response: Pt is continuing to apply interventions learned in session into daily life situations. Pt is currently on track to meet goals utilizing interventions mentioned above. Personal growth and progress noted. Treatment to continue as indicated.   Patient is continuing to do a great job of identifying triggers and utilizing coping skills to manage symptoms in the moment.  Plan: Return again in 4 weeks.  Diagnosis:  No diagnosis found.   Collaboration of Care: Other Pt encouraged to continue follow up appointments with psychiatrist--Dr. [name]  Patient/Guardian was advised Release of Information must be obtained prior to any record release in order to collaborate their care with an outside provider. Patient/Guardian was advised if they have not already done so to contact the registration department to sign all necessary forms in order for [place] to release information regarding their care.   Consent: Patient/Guardian gives verbal consent for treatment and assignment of benefits for services provided during this visit. Patient/Guardian expressed understanding and agreed to proceed.   Ernest Haber Nissa Stannard, LCSW 06/08/2023

## 2023-06-15 ENCOUNTER — Encounter: Payer: Self-pay | Admitting: Family Medicine

## 2023-06-20 ENCOUNTER — Ambulatory Visit
Admission: RE | Admit: 2023-06-20 | Discharge: 2023-06-20 | Disposition: A | Discharge status: Home / Self Care | Payer: 59 | Source: Ambulatory Visit | Attending: Family Medicine | Admitting: Family Medicine

## 2023-06-20 DIAGNOSIS — M9901 Segmental and somatic dysfunction of cervical region: Secondary | ICD-10-CM

## 2023-06-20 DIAGNOSIS — M542 Cervicalgia: Secondary | ICD-10-CM

## 2023-06-21 MED ORDER — DUPIXENT 300 MG/2ML ~~LOC~~ SOSY: 300.0000 mg | PREFILLED_SYRINGE | SUBCUTANEOUS | 5 refills | Status: DC

## 2023-06-22 ENCOUNTER — Other Ambulatory Visit: Payer: Self-pay

## 2023-06-22 ENCOUNTER — Encounter: Payer: Self-pay | Admitting: Family Medicine

## 2023-06-22 DIAGNOSIS — M5412 Radiculopathy, cervical region: Secondary | ICD-10-CM

## 2023-07-01 NOTE — Progress Notes (Signed)
[name] [address] [place] [address] Phone: [phone] Subjective:   INadine Counts, am serving as a scribe for Dr. Antoine Primas.  I'm seeing this patient by the request  of:  Margaretann Loveless, MD  CC: back and neck pain follow up   IHK:[id number]  Kristy Shaw is a 47 y.o. female coming in with complaint of back and neck pain. OMT on 05/17/2023. Patient states same per usual. No new concerns.  Medications patient has been prescribed: Zanaflex  Taking: yes   MRI of the cervical spine shows the patient did have degenerative changes noted with some spinal stenosis and especially at C5-C6 that is likely contributing to some of the pain.  Still having radicular pain also on the right side and MRI does show a right-sided C4-C5 foraminal stenosis.    Reviewed prior external information including notes and imaging from previsou exam, outside providers and external EMR if available.   As well as notes that were available from care everywhere and other healthcare systems.  Past medical history, social, surgical and family history all reviewed in electronic medical record.  No pertanent information unless stated regarding to the chief complaint.   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 [facility])    Depression    Dyspnea    Dysrhythmia    IRREGULAR HEART BEAT   Family history of adverse reaction to anesthesia    DAD-STOKE COMING OUT OF ANESTHESIA    Family history of breast cancer    Fatigue    Fibromyalgia    GERD (gastroesophageal reflux disease)    OCC   IBS (irritable bowel syndrome)    Mitral valve prolapse    Restless leg syndrome    Sleep apnea    HAS CPAP MACHINE BUT DOES NOT USE ON A REGULAR BASIS    Allergies  Allergen Reactions   Gold-Containing Drug Products  Dermatitis   Adhesive [Tape] Rash   Doxycycline Rash     Review of Systems:  No headache, visual changes, nausea, vomiting, diarrhea, constipation, dizziness, abdominal pain, skin rash, fevers, chills, night sweats, weight loss, swollen lymph nodes, body aches, joint swelling, chest pain, shortness of breath, mood changes. POSITIVE muscle aches  Objective  Blood pressure 126/86, pulse 86, height 5\' 8"  (1.727 m), weight 153 lb (69.4 kg), SpO2 95%.   General: No apparent distress alert and oriented x3 mood and affect normal, dressed appropriately.  HEENT: Pupils equal, extraocular movements intact  Respiratory: Patient's speak in full sentences and does not appear short of breath  Cardiovascular: No lower extremity edema, non tender, no erythema  MSK:  Back TTP with hypermobility tightness FABER  Patient neck exam still has some limited sidebending bilaterally.  Osteopathic findings  T3 extended rotated and side bent right inhaled rib T8 extended rotated and side bent left L2 flexed rotated and side bent right Sacrum right on right    Assessment and Plan:  Scapular dyskinesis Discussed HEP patient still has a hypermobility noted.  I do think that the neck is Contributing to a lot of the discomfort and pain as well at the moment.  Still feel that an epidural would be significantly beneficial for this individual.  Patient  will have that ordered in the near future and hopefully have it done.  She will call after discussing the different benefits and possible complications.  Increase activity slowly otherwise.  Follow-up with me again 6 to 8 weeks otherwise.    Nonallopathic problems  Decision today to treat with OMT was based on Physical Exam  After verbal consent patient was treated with HVLA, ME, FPR techniques in  rib, thoracic, lumbar, and sacral  areas defer treatment of the neck while we await epidural.  Patient tolerated the procedure well with improvement in  symptoms  Patient given exercises, stretches and lifestyle modifications  See medications in patient instructions if given  Patient will follow up in 4-8 weeks     The above documentation has been reviewed and is accurate and complete Judi Saa, DO         Note: This dictation was prepared with Dragon dictation along with smaller phrase technology. Any transcriptional errors that result from this process are unintentional.

## 2023-07-05 ENCOUNTER — Encounter: Payer: Self-pay | Admitting: Family Medicine

## 2023-07-05 ENCOUNTER — Ambulatory Visit: Payer: 59 | Admitting: Family Medicine

## 2023-07-05 VITALS — BP 126/86 | HR 86 | Ht 68.0 in | Wt 153.0 lb

## 2023-07-05 DIAGNOSIS — M9902 Segmental and somatic dysfunction of thoracic region: Secondary | ICD-10-CM

## 2023-07-05 DIAGNOSIS — G2589 Other specified extrapyramidal and movement disorders: Secondary | ICD-10-CM

## 2023-07-05 DIAGNOSIS — M9903 Segmental and somatic dysfunction of lumbar region: Secondary | ICD-10-CM

## 2023-07-05 DIAGNOSIS — M9908 Segmental and somatic dysfunction of rib cage: Secondary | ICD-10-CM

## 2023-07-05 DIAGNOSIS — M9904 Segmental and somatic dysfunction of sacral region: Secondary | ICD-10-CM

## 2023-07-05 NOTE — Assessment & Plan Note (Signed)
Discussed HEP patient still has a hypermobility noted.  I do think that the neck is Contributing to a lot of the discomfort and pain as well at the moment.  Still feel that an epidural would be significantly beneficial for this individual.  Patient will have that ordered in the near future and hopefully have it done.  She will call after discussing the different benefits and possible complications.  Increase activity slowly otherwise.  Follow-up with me again 6 to 8 weeks otherwise.

## 2023-07-05 NOTE — Patient Instructions (Signed)
Good to see you! Get the epidural scheduled See you again in 6-8 weeks

## 2023-07-15 ENCOUNTER — Ambulatory Visit
Admission: RE | Admit: 2023-07-15 | Discharge: 2023-07-15 | Disposition: A | Discharge status: Home / Self Care | Payer: 59 | Source: Ambulatory Visit | Attending: Family Medicine | Admitting: Family Medicine

## 2023-07-15 DIAGNOSIS — M5412 Radiculopathy, cervical region: Secondary | ICD-10-CM

## 2023-07-15 MED ORDER — IOPAMIDOL (ISOVUE-M 300) INJECTION 61%
1.0000 mL | Freq: Once | INTRAMUSCULAR | Status: AC | PRN
  Administered 2023-07-15: 1 mL via EPIDURAL

## 2023-07-15 MED ORDER — TRIAMCINOLONE ACETONIDE 40 MG/ML IJ SUSP (RADIOLOGY)
60.0000 mg | Freq: Once | INTRAMUSCULAR | Status: AC
  Administered 2023-07-15: 60 mg via EPIDURAL

## 2023-07-15 NOTE — Discharge Instructions (Signed)

## 2023-07-18 ENCOUNTER — Telehealth: Payer: Self-pay | Admitting: Family Medicine

## 2023-07-18 NOTE — Telephone Encounter (Signed)
Patient sent a message through MyChart/Appointment Scheduling:  "I had my epidural injection today and I am having some new pain in my neck. I have been doing chin tucks in physical therapy for awhile to stretch my neck. I am unable to do a chin tuck at this time without this new pain in my neck. Is this normal and is there anything I should steer away from doing in physical therapy next week?"  Please advise.

## 2023-07-20 ENCOUNTER — Other Ambulatory Visit: Payer: Self-pay | Admitting: Psychiatry

## 2023-07-20 DIAGNOSIS — F418 Other specified anxiety disorders: Secondary | ICD-10-CM

## 2023-07-20 DIAGNOSIS — F1590 Other stimulant use, unspecified, uncomplicated: Secondary | ICD-10-CM

## 2023-07-20 DIAGNOSIS — F3342 Major depressive disorder, recurrent, in full remission: Secondary | ICD-10-CM

## 2023-08-12 NOTE — Progress Notes (Unsigned)
[name] [address] [place] [address] Phone: [phone] Subjective:    I'm seeing this patient by the request  of:  Margaretann Loveless, MD  CC: Back and neck pain follow-up  LKG:[id number]  Kristy Shaw is a 46 y.o. female coming in with complaint of back and neck pain. OMT on 06/06/2023. Patient states same per usual. No new concerns.  Has been having tightness at the base of the skull.  Patient states that the injection she was given which was an epidural previously helped out significantly with the shoulder pain remains almost nonexistent.  Still though the pain in the back of the neck.  Medications patient has been prescribed:   Taking:         Reviewed prior external information including notes and imaging from previsou exam, outside providers and external EMR if available.   As well as notes that were available from care everywhere and other healthcare systems.  Past medical history, social, surgical and family history all reviewed in electronic medical record.  No pertanent information unless stated regarding to the chief complaint.   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 [facility])    Depression    Dyspnea    Dysrhythmia    IRREGULAR HEART BEAT   Family history of adverse reaction to anesthesia    DAD-STOKE COMING OUT OF ANESTHESIA    Family history of breast cancer    Fatigue    Fibromyalgia    GERD (gastroesophageal reflux disease)    OCC   IBS (irritable bowel syndrome)    Mitral valve prolapse    Restless leg syndrome    Sleep apnea    HAS CPAP MACHINE BUT DOES NOT USE ON A REGULAR BASIS    Allergies  Allergen Reactions   Gold-Containing Drug Products Dermatitis   Adhesive [Tape] Rash   Doxycycline Rash     Review of Systems:  No headache,  visual changes, nausea, vomiting, diarrhea, constipation, dizziness, abdominal pain, skin rash, fevers, chills, night sweats, weight loss, swollen lymph nodes, body aches, joint swelling, chest pain, shortness of breath, mood changes. POSITIVE muscle aches  Objective  Blood pressure 102/64, pulse 96, height 5\' 8"  (1.727 m), weight 154 lb (69.9 kg), SpO2 96%.   General: No apparent distress alert and oriented x3 mood and affect normal, dressed appropriately.  HEENT: Pupils equal, extraocular movements intact  Respiratory: Patient's speak in full sentences and does not appear short of breath  Hypermobility still noted.  Significant tightness in the C2-C3 area on the right side  Osteopathic findings  C2 flexed rotated and side bent right C3 flexed rotated and side bent right C6 flexed rotated and side bent left T3 extended rotated and side bent right inhaled rib       Assessment and Plan:  Degenerative disc disease, cervical MRI shows the patient does have a potential protruding disc that would be contributing to some of the pain.  Did make some improvement but not all the way at the moment.  Discussed with patient about icing regimen and home exercises.  Discussed which activities to do and which ones to avoid.  Patient would like to try 1 more epidural and see how she  responds.  Discussed if this continues to give her difficulty can continue to work up with neurology as well and see if she is a candidate for Botox.  Follow-up with me again for further evaluation and management in 8 weeks    Nonallopathic problems  Decision today to treat with OMT was based on Physical Exam  After verbal consent patient was treated with HVLA, ME, FPR techniques in cervical, rib, thoracic,  areas  Patient tolerated the procedure well with improvement in symptoms  Patient given exercises, stretches and lifestyle modifications  See medications in patient instructions if given  Patient will follow up  in 4---8 weeks    The above documentation has been reviewed and is accurate and complete Judi Saa, DO          Note: This dictation was prepared with Dragon dictation along with smaller phrase technology. Any transcriptional errors that result from this process are unintentional.

## 2023-08-16 ENCOUNTER — Encounter: Payer: Self-pay | Admitting: Family Medicine

## 2023-08-16 ENCOUNTER — Ambulatory Visit: Payer: 59 | Admitting: Family Medicine

## 2023-08-16 VITALS — BP 102/64 | HR 96 | Ht 68.0 in | Wt 154.0 lb

## 2023-08-16 DIAGNOSIS — M5412 Radiculopathy, cervical region: Secondary | ICD-10-CM

## 2023-08-16 DIAGNOSIS — M5030 Other cervical disc degeneration, unspecified cervical region: Secondary | ICD-10-CM | POA: Diagnosis not present

## 2023-08-16 DIAGNOSIS — M9904 Segmental and somatic dysfunction of sacral region: Secondary | ICD-10-CM | POA: Diagnosis not present

## 2023-08-16 DIAGNOSIS — M9902 Segmental and somatic dysfunction of thoracic region: Secondary | ICD-10-CM | POA: Diagnosis not present

## 2023-08-16 DIAGNOSIS — M9901 Segmental and somatic dysfunction of cervical region: Secondary | ICD-10-CM

## 2023-08-16 NOTE — Assessment & Plan Note (Signed)
MRI shows the patient does have a potential protruding disc that would be contributing to some of the pain.  Did make some improvement but not all the way at the moment.  Discussed with patient about icing regimen and home exercises.  Discussed which activities to do and which ones to avoid.  Patient would like to try 1 more epidural and see how she responds.  Discussed if this continues to give her difficulty can continue to work up with neurology as well and see if she is a candidate for Botox.  Follow-up with me again for further evaluation and management in 8 weeks

## 2023-08-16 NOTE — Patient Instructions (Addendum)
[facility] [phone] Call Today  See you again in 2 months

## 2023-08-26 ENCOUNTER — Encounter: Payer: Self-pay | Admitting: Family Medicine

## 2023-08-29 ENCOUNTER — Encounter: Payer: Self-pay | Admitting: Psychiatry

## 2023-08-29 ENCOUNTER — Telehealth (INDEPENDENT_AMBULATORY_CARE_PROVIDER_SITE_OTHER): Payer: 59 | Admitting: Psychiatry

## 2023-08-29 DIAGNOSIS — F17200 Nicotine dependence, unspecified, uncomplicated: Secondary | ICD-10-CM

## 2023-08-29 DIAGNOSIS — F1590 Other stimulant use, unspecified, uncomplicated: Secondary | ICD-10-CM

## 2023-08-29 DIAGNOSIS — F17210 Nicotine dependence, cigarettes, uncomplicated: Secondary | ICD-10-CM

## 2023-08-29 DIAGNOSIS — G479 Sleep disorder, unspecified: Secondary | ICD-10-CM | POA: Diagnosis not present

## 2023-08-29 DIAGNOSIS — F3342 Major depressive disorder, recurrent, in full remission: Secondary | ICD-10-CM | POA: Diagnosis not present

## 2023-08-29 DIAGNOSIS — F418 Other specified anxiety disorders: Secondary | ICD-10-CM | POA: Diagnosis not present

## 2023-08-29 MED ORDER — GABAPENTIN 300 MG PO CAPS: 300.0000 mg | ORAL_CAPSULE | Freq: Four times a day (QID) | ORAL | 0 refills | Status: DC
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
## 2023-09-01 NOTE — Discharge Instructions (Signed)

## 2023-09-02 ENCOUNTER — Ambulatory Visit
Admission: RE | Admit: 2023-09-02 | Discharge: 2023-09-02 | Disposition: A | Discharge status: Home / Self Care | Payer: 59 | Source: Ambulatory Visit | Attending: Family Medicine | Admitting: Family Medicine

## 2023-09-02 DIAGNOSIS — M5412 Radiculopathy, cervical region: Secondary | ICD-10-CM

## 2023-09-02 MED ORDER — IOPAMIDOL (ISOVUE-M 300) INJECTION 61%
1.0000 mL | Freq: Once | INTRAMUSCULAR | Status: AC | PRN
  Administered 2023-09-02: 1 mL via EPIDURAL

## 2023-09-02 MED ORDER — TRIAMCINOLONE ACETONIDE 40 MG/ML IJ SUSP (RADIOLOGY)
60.0000 mg | Freq: Once | INTRAMUSCULAR | Status: AC
  Administered 2023-09-02: 60 mg via EPIDURAL

## 2023-09-05 ENCOUNTER — Ambulatory Visit: Payer: 59 | Admitting: Cardiology

## 2023-09-05 ENCOUNTER — Encounter: Payer: Self-pay | Admitting: Cardiology

## 2023-09-05 VITALS — BP 130/78 | HR 64 | Ht 69.0 in | Wt 159.2 lb

## 2023-09-05 DIAGNOSIS — J309 Allergic rhinitis, unspecified: Secondary | ICD-10-CM | POA: Diagnosis not present

## 2023-09-05 DIAGNOSIS — J069 Acute upper respiratory infection, unspecified: Secondary | ICD-10-CM

## 2023-09-05 DIAGNOSIS — J0100 Acute maxillary sinusitis, unspecified: Secondary | ICD-10-CM | POA: Insufficient documentation

## 2023-09-05 HISTORY — DX: Acute maxillary sinusitis, unspecified: J01.00

## 2023-09-05 HISTORY — DX: Allergic rhinitis, unspecified: J30.9

## 2023-09-05 HISTORY — DX: Acute upper respiratory infection, unspecified: J06.9

## 2023-09-05 MED ORDER — LEVOFLOXACIN 500 MG PO TABS: 500.0000 mg | ORAL_TABLET | Freq: Every day | ORAL | 0 refills | Status: AC

## 2023-09-05 MED ORDER — FLUTICASONE PROPIONATE 50 MCG/ACT NA SUSP: 1.0000 | Freq: Every day | NASAL | 2 refills | Status: AC

## 2023-09-05 MED ORDER — ALBUTEROL SULFATE HFA 108 (90 BASE) MCG/ACT IN AERS: 1.0000 | INHALATION_SPRAY | Freq: Four times a day (QID) | RESPIRATORY_TRACT | 3 refills | Status: DC | PRN

## 2023-09-05 MED ORDER — ASMANEX HFA 100 MCG/ACT IN AERO: 2.0000 | INHALATION_SPRAY | Freq: Two times a day (BID) | RESPIRATORY_TRACT | 3 refills | Status: AC
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
## 2023-09-12 ENCOUNTER — Telehealth: Payer: Self-pay | Admitting: Internal Medicine

## 2023-09-12 NOTE — Telephone Encounter (Signed)
Left VM for patient to call me back regarding how she is doing from last week's visit and to follow up on her [employer] review. If she calls back please send call directly to me ([email or web address]).

## 2023-09-15 ENCOUNTER — Telehealth: Payer: Self-pay

## 2023-09-16 ENCOUNTER — Encounter: Payer: Self-pay | Admitting: Cardiology

## 2023-09-16 ENCOUNTER — Ambulatory Visit
Admission: RE | Admit: 2023-09-16 | Discharge: 2023-09-16 | Disposition: A | Discharge status: Home / Self Care | Payer: 59 | Source: Ambulatory Visit | Attending: Cardiology | Admitting: Cardiology

## 2023-09-16 ENCOUNTER — Ambulatory Visit: Payer: 59 | Admitting: Cardiology

## 2023-09-16 ENCOUNTER — Ambulatory Visit
Admission: RE | Admit: 2023-09-16 | Discharge: 2023-09-16 | Disposition: A | Discharge status: Home / Self Care | Payer: 59 | Source: Ambulatory Visit | Attending: Cardiology | Admitting: *Deleted

## 2023-09-16 VITALS — BP 110/78 | HR 87 | Temp 97.9°F | Ht 69.0 in | Wt 156.6 lb

## 2023-09-16 DIAGNOSIS — J309 Allergic rhinitis, unspecified: Secondary | ICD-10-CM

## 2023-09-16 DIAGNOSIS — J069 Acute upper respiratory infection, unspecified: Secondary | ICD-10-CM | POA: Diagnosis not present

## 2023-09-16 DIAGNOSIS — R059 Cough, unspecified: Secondary | ICD-10-CM | POA: Insufficient documentation

## 2023-09-16 MED ORDER — PREDNISONE 20 MG PO TABS: ORAL_TABLET | ORAL | 1 refills | Status: DC

## 2023-09-16 MED ORDER — BENZONATATE 100 MG PO CAPS: 200.0000 mg | ORAL_CAPSULE | Freq: Three times a day (TID) | ORAL | 1 refills | Status: DC | PRN
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
## 2023-09-22 NOTE — Telephone Encounter (Signed)
Patient scheduled seen 09/16/23.

## 2023-10-03 ENCOUNTER — Ambulatory Visit: Payer: 59 | Admitting: Cardiology

## 2023-10-03 ENCOUNTER — Encounter: Payer: Self-pay | Admitting: Cardiology

## 2023-10-03 VITALS — BP 110/70 | HR 76 | Ht 69.0 in | Wt 162.4 lb

## 2023-10-03 DIAGNOSIS — J069 Acute upper respiratory infection, unspecified: Secondary | ICD-10-CM | POA: Diagnosis not present
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
## 2023-10-15 ENCOUNTER — Other Ambulatory Visit: Payer: Self-pay | Admitting: Psychiatry

## 2023-10-15 DIAGNOSIS — F1590 Other stimulant use, unspecified, uncomplicated: Secondary | ICD-10-CM

## 2023-10-15 DIAGNOSIS — F418 Other specified anxiety disorders: Secondary | ICD-10-CM

## 2023-10-15 DIAGNOSIS — F3342 Major depressive disorder, recurrent, in full remission: Secondary | ICD-10-CM

## 2023-10-18 ENCOUNTER — Ambulatory Visit: Payer: 59 | Admitting: Family Medicine

## 2023-10-18 ENCOUNTER — Encounter: Payer: Self-pay | Admitting: Family Medicine

## 2023-10-18 VITALS — BP 122/84 | HR 74 | Ht 69.0 in | Wt 156.0 lb

## 2023-10-18 DIAGNOSIS — M9904 Segmental and somatic dysfunction of sacral region: Secondary | ICD-10-CM

## 2023-10-18 DIAGNOSIS — M9908 Segmental and somatic dysfunction of rib cage: Secondary | ICD-10-CM

## 2023-10-18 DIAGNOSIS — M9901 Segmental and somatic dysfunction of cervical region: Secondary | ICD-10-CM | POA: Diagnosis not present

## 2023-10-18 DIAGNOSIS — M9903 Segmental and somatic dysfunction of lumbar region: Secondary | ICD-10-CM | POA: Diagnosis not present

## 2023-10-18 DIAGNOSIS — M5030 Other cervical disc degeneration, unspecified cervical region: Secondary | ICD-10-CM | POA: Diagnosis not present

## 2023-10-18 DIAGNOSIS — M2550 Pain in unspecified joint: Secondary | ICD-10-CM

## 2023-10-18 DIAGNOSIS — M9902 Segmental and somatic dysfunction of thoracic region: Secondary | ICD-10-CM | POA: Diagnosis not present

## 2023-10-18 LAB — COMPREHENSIVE METABOLIC PANEL
ALT: 11 U/L (ref 0–35)
AST: 16 U/L (ref 0–37)
Albumin: 4.3 g/dL (ref 3.5–5.2)
Alkaline Phosphatase: 56 U/L (ref 39–117)
BUN: 5 mg/dL — ABNORMAL LOW (ref 6–23)
CO2: 28 meq/L (ref 19–32)
Calcium: 9.1 mg/dL (ref 8.4–10.5)
Chloride: 102 meq/L (ref 96–112)
Creatinine, Ser: 0.77 mg/dL (ref 0.40–1.20)
GFR: 91.55 mL/min (ref >60.00)
Glucose, Bld: 87 mg/dL (ref 70–99)
Potassium: 3.3 meq/L — ABNORMAL LOW (ref 3.5–5.1)
Sodium: 137 meq/L (ref 135–145)
Total Bilirubin: 0.4 mg/dL (ref 0.2–1.2)
Total Protein: 7.3 g/dL (ref 6.0–8.3)

## 2023-10-18 LAB — CBC WITH DIFFERENTIAL/PLATELET
Basophils Absolute: 0 10*3/uL (ref 0.0–0.1)
Basophils Relative: 0.7 % (ref 0.0–3.0)
Eosinophils Absolute: 0.1 10*3/uL (ref 0.0–0.7)
Eosinophils Relative: 2.3 % (ref 0.0–5.0)
HCT: 41.8 % (ref 36.0–46.0)
Hemoglobin: 13.8 g/dL (ref 12.0–15.0)
Lymphocytes Relative: 29.6 % (ref 12.0–46.0)
Lymphs Abs: 1.7 10*3/uL (ref 0.7–4.0)
MCHC: 32.9 g/dL (ref 30.0–36.0)
MCV: 93.8 fL (ref 78.0–100.0)
Monocytes Absolute: 0.5 10*3/uL (ref 0.1–1.0)
Monocytes Relative: 9.1 % (ref 3.0–12.0)
Neutro Abs: 3.4 10*3/uL (ref 1.4–7.7)
Neutrophils Relative %: 58.3 % (ref 43.0–77.0)
Platelets: 270 10*3/uL (ref 150.0–400.0)
RBC: 4.46 Mil/uL (ref 3.87–5.11)
RDW: 12.9 % (ref 11.5–15.5)
WBC: 5.9 10*3/uL (ref 4.0–10.5)

## 2023-10-18 LAB — TSH: TSH: 3.24 u[IU]/mL (ref 0.35–5.50)

## 2023-10-18 LAB — IBC PANEL
Iron: 90 ug/dL (ref 42–145)
Saturation Ratios: 23.9 % (ref 20.0–50.0)
TIBC: 376.6 ug/dL (ref 250.0–450.0)
Transferrin: 269 mg/dL (ref 212.0–360.0)

## 2023-10-18 LAB — URIC ACID: Uric Acid, Serum: 3.5 mg/dL (ref 2.4–7.0)

## 2023-10-18 LAB — VITAMIN B12: Vitamin B-12: 376 pg/mL (ref 211–911)

## 2023-10-18 LAB — C-REACTIVE PROTEIN: CRP: <1 mg/dL (ref 0.5–20.0)

## 2023-10-18 LAB — FERRITIN: Ferritin: 58.4 ng/mL (ref 10.0–291.0)

## 2023-10-18 LAB — VITAMIN D 25 HYDROXY (VIT D DEFICIENCY, FRACTURES): VITD: 13.29 ng/mL — ABNORMAL LOW (ref 30.00–100.00)

## 2023-10-18 LAB — SEDIMENTATION RATE: Sed Rate: 39 mm/h — ABNORMAL HIGH (ref 0–20)

## 2023-10-18 NOTE — Assessment & Plan Note (Signed)
Degenerative disc disease.  Continuing to have discomfort.  At this point do feel we should workup for further evaluation for any autoimmune disease that could be contributing to the early arthritic changes that patient does have.  Discussed icing regimen and home exercises.  Discussed which activities to do and which ones to avoid.  Increase activity slowly.  Follow-up with me again in 6 to 8 weeks.  No change in any other medications secondary to patient being off difficult medications to add to.

## 2023-10-18 NOTE — Patient Instructions (Addendum)
Labs today Keep working on posture  See me in 2 months

## 2023-10-19 ENCOUNTER — Other Ambulatory Visit: Payer: Self-pay

## 2023-10-19 MED ORDER — VITAMIN D (ERGOCALCIFEROL) 1.25 MG (50000 UNIT) PO CAPS: 50000.0000 [IU] | ORAL_CAPSULE | ORAL | 0 refills | Status: AC

## 2023-10-21 LAB — PTH, INTACT AND CALCIUM
Calcium: 9.2 mg/dL (ref 8.6–10.2)
PTH: 73 pg/mL (ref 16–77)

## 2023-10-21 LAB — CALCIUM, IONIZED: Calcium, Ion: 4.9 mg/dL (ref 4.7–5.5)

## 2023-10-21 LAB — CYCLIC CITRUL PEPTIDE ANTIBODY, IGG: Cyclic Citrullin Peptide Ab: <16 U

## 2023-10-21 LAB — RHEUMATOID FACTOR: Rheumatoid fact SerPl-aCnc: <10 [IU]/mL (ref <14)

## 2023-10-21 LAB — ANA: Anti Nuclear Antibody (ANA): NEGATIVE

## 2023-10-21 LAB — ANGIOTENSIN CONVERTING ENZYME: Angiotensin-Converting Enzyme: 39 U/L (ref 9–67)

## 2023-10-25 ENCOUNTER — Ambulatory Visit: Payer: 59 | Admitting: Dermatology

## 2023-10-25 DIAGNOSIS — L2089 Other atopic dermatitis: Secondary | ICD-10-CM

## 2023-10-25 DIAGNOSIS — Z79899 Other long term (current) drug therapy: Secondary | ICD-10-CM | POA: Diagnosis not present

## 2023-10-25 MED ORDER — DUPIXENT 300 MG/2ML ~~LOC~~ SOSY: PREFILLED_SYRINGE | SUBCUTANEOUS | 5 refills | Status: DC

## 2023-10-25 MED ORDER — TACROLIMUS 0.1 % EX OINT: TOPICAL_OINTMENT | Freq: Two times a day (BID) | CUTANEOUS | 2 refills | Status: AC

## 2023-10-25 NOTE — Progress Notes (Signed)
   New Patient Visit   Subjective  Kristy Shaw is a 47 y.o. female who presents for the following: Atopic Dermatitis Patient here for 6 month atopic derm follow up Currently on dupixent injections at home. Reports recent itchy area at left hand but otherwise staying clear.  Overall doing well, no side effects.  Also reports having an allergy to gold and wearing some gold earrings by mistake. Developed a breakout at left posterior earlobe.     The following portions of the chart were reviewed this encounter and updated as appropriate: medications, allergies, medical history  Review of Systems:  No other skin or systemic complaints except as noted in HPI or Assessment and Plan.  Objective  Well appearing patient in no apparent distress; mood and affect are within normal limits.  Areas Examined: B/l hands, legs, arms, back, left ear  Relevant physical exam findings are noted in the Assessment and Plan.    Assessment & Plan   Other atopic dermatitis  Related Medications tacrolimus (PROTOPIC) 0.1 % ointment Apply topically 2 (two) times daily.  dupilumab (DUPIXENT) 300 MG/2ML prefilled syringe INJECT 1 SYRINGE (300MG ) SUBCUTANEOUSLY EVERY 14 DAYS.   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 Currently well-controlled.    Atopic dermatitis - Severe, on Dupixent (biologic medication).  Atopic dermatitis (eczema) is a chronic, relapsing, pruritic condition that can significantly affect quality of life. It is often associated with allergic rhinitis and/or asthma and can require treatment with topical medications, phototherapy, or in severe cases biologic medications, which require long term medication management.     Treatment Plan: Continue Dupixent injections q 2  weeks  Recommend TMC 0.1% cream to spot treat injection sites as needed. Avoid applying to face, groin, and axilla. Use as directed. Long-term use can cause thinning of the skin. Continue tacrolimus ointment 0.1 % - apply topically to aa of face and body bid prn for itchy spots  Continue clobetasol/CeraVe mix qd/bid or Clobetasol ointment prn flares. Avoid face, groin, axilla.   Denies any side effects   Dupilumab (Dupixent) is a treatment given by injection for adults and children with moderate-to-severe atopic dermatitis. Goal is control of skin condition, not cure. It is given as 2 injections at the first dose followed by 1 injection ever 2 weeks thereafter.  Young children are dosed monthly.   Potential side effects include allergic reaction, herpes infections, injection site reactions and conjunctivitis (inflammation of the eyes).  The use of Dupixent requires long term medication management, including periodic office visits.   Topical steroids (such as triamcinolone, fluocinolone, fluocinonide, mometasone, clobetasol, halobetasol, betamethasone, hydrocortisone) can cause thinning and lightening of the skin if they are used for too long in the same area. Your physician has selected the right strength medicine for your problem and area affected on the body. Please use your medication only as directed by your physician to prevent side effects.    Recommend gentle skin care.   Return in about 6 months (around 04/23/2024) for atopic derm.  I, Asher Muir, CMA, am acting as scribe for Willeen Niece, MD.  Documentation: I have reviewed the above documentation for accuracy and completeness, and I agree with the above.  Willeen Niece, MD

## 2023-10-25 NOTE — Patient Instructions (Addendum)
  Avoid picking areas   Can use clobetasol ointment one to two times daily at left earlobe for 1 week then switch to using tacrolimus ointment as needed for flares   Topical steroids (such as triamcinolone, fluocinolone, fluocinonide, mometasone, clobetasol, halobetasol, betamethasone, hydrocortisone) can cause thinning and lightening of the skin if they are used for too long in the same area. Your physician has selected the right strength medicine for your problem and area affected on the body. Please use your medication only as directed by your physician to prevent side effects.      Dupilumab (Dupixent) is a treatment given by injection for adults and children with moderate-to-severe atopic dermatitis. Goal is control of skin condition, not cure. It is given as 2 injections at the first dose followed by 1 injection ever 2 weeks thereafter.  Young children are dosed monthly.  Potential side effects include allergic reaction, herpes infections, injection site reactions and conjunctivitis (inflammation of the eyes).  The use of Dupixent requires long term medication management, including periodic office visits.   Gentle Skin Care Guide  1. Bathe no more than once a day.  2. Avoid bathing in hot water  3. Use a mild soap like Dove, Vanicream, Cetaphil, CeraVe. Can use Lever 2000 or Cetaphil antibacterial soap  4. Use soap only where you need it. On most days, use it under your arms, between your legs, and on your feet. Let the water rinse other areas unless visibly dirty.  5. When you get out of the bath/shower, use a towel to gently blot your skin dry, don't rub it.  6. While your skin is still a little damp, apply a moisturizing cream such as Vanicream, CeraVe, Cetaphil, Eucerin, Sarna lotion or plain Vaseline Jelly. For hands apply Neutrogena [place] Hand Cream or Excipial Hand Cream.  7. Reapply moisturizer any time you start to itch or feel dry.  8. Sometimes using free and clear  laundry detergents can be helpful. Fabric softener sheets should be avoided. Downy Free & Gentle liquid, or any liquid fabric softener that is free of dyes and perfumes, it acceptable to use  9. If your doctor has given you prescription creams you may apply moisturizers over them

## 2023-10-27 ENCOUNTER — Encounter: Payer: Self-pay | Admitting: Psychiatry

## 2023-10-27 ENCOUNTER — Telehealth: Payer: 59 | Admitting: Psychiatry

## 2023-10-27 DIAGNOSIS — F3342 Major depressive disorder, recurrent, in full remission: Secondary | ICD-10-CM | POA: Diagnosis not present

## 2023-10-27 DIAGNOSIS — F418 Other specified anxiety disorders: Secondary | ICD-10-CM

## 2023-10-27 DIAGNOSIS — G479 Sleep disorder, unspecified: Secondary | ICD-10-CM

## 2023-10-27 DIAGNOSIS — F1590 Other stimulant use, unspecified, uncomplicated: Secondary | ICD-10-CM

## 2023-10-27 DIAGNOSIS — F17200 Nicotine dependence, unspecified, uncomplicated: Secondary | ICD-10-CM

## 2023-10-27 DIAGNOSIS — F17210 Nicotine dependence, cigarettes, uncomplicated: Secondary | ICD-10-CM | POA: Diagnosis not present

## 2023-10-27 MED ORDER — NICOTINE 14 MG/24HR TD PT24: 14.0000 mg | MEDICATED_PATCH | Freq: Every day | TRANSDERMAL | 2 refills | Status: DC

## 2023-10-27 MED ORDER — NICOTINE POLACRILEX 2 MG MT GUM
2.0000 mg | CHEWING_GUM | OROMUCOSAL | 0 refills | Status: AC
Start: 2023-10-27 — End: ?
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
## 2023-10-31 ENCOUNTER — Ambulatory Visit: Payer: 59 | Admitting: Cardiology

## 2023-10-31 ENCOUNTER — Inpatient Hospital Stay: Payer: 59 | Attending: Hematology and Oncology | Admitting: Hematology and Oncology

## 2023-10-31 VITALS — BP 134/71 | HR 68 | Temp 97.3°F | Resp 17 | Ht 69.0 in | Wt 159.6 lb

## 2023-10-31 DIAGNOSIS — Z79899 Other long term (current) drug therapy: Secondary | ICD-10-CM | POA: Insufficient documentation

## 2023-10-31 DIAGNOSIS — M797 Fibromyalgia: Secondary | ICD-10-CM | POA: Diagnosis not present

## 2023-10-31 DIAGNOSIS — M1990 Unspecified osteoarthritis, unspecified site: Secondary | ICD-10-CM | POA: Diagnosis not present

## 2023-10-31 DIAGNOSIS — N6489 Other specified disorders of breast: Secondary | ICD-10-CM | POA: Diagnosis present

## 2023-10-31 MED ORDER — TAMOXIFEN CITRATE 10 MG PO TABS: 5.0000 mg | ORAL_TABLET | Freq: Every day | ORAL | 1 refills | Status: DC

## 2023-10-31 NOTE — Assessment & Plan Note (Addendum)
Risk assessment: Tyrer-Cuzick 10-year risk: 6% (average risk 2.4%) Lifetime risk 27% (average risk 10.5%)   Recommendation: 1.  Tamoxifen  5 mg a day.  Started 07/30/2021 2. patient stays very active and exercise regularly.  Eats fruits and vegetables and does not eat too much red meat. ------------------------------------------------------------------------------------------------------------------------ Tamoxifen toxicities:No side effects Chronic fibromyalgia pain is same as before.   Breast cancer surveillance: Mammograms 12/08/2021: Benign breast density category C Breast MRI 04/29/2022: Benign breast density category C.  I recommended doing MRI every other year.   Return to clinic in 1 year for follow-up

## 2023-10-31 NOTE — Progress Notes (Signed)
Patient Care Team: Margaretann Loveless, MD as PCP - General (Internal Medicine) Margaretann Loveless, MD as Referring Physician (Internal Medicine) Kieth Brightly, MD (General Surgery)  DIAGNOSIS:  Encounter Diagnosis  Name Primary?   Complex sclerosing lesion of right breast Yes      CHIEF COMPLIANT: Follow-up on tamoxifen therapy  HISTORY OF PRESENT ILLNESS:  History of Present Illness   The patient, with a history of breast cancer, fibromyalgia, and arthritis, presents for a routine follow-up. She has been on tamoxifen 5mg  for over two years without any significant side effects. She denies experiencing hot flashes or joint stiffness, but notes that it's difficult to differentiate any potential side effects from her baseline fibromyalgia and arthritis symptoms.  She has been diligent with her mammograms and other tests, with the most recent mammogram in January of this year. During this mammogram, additional images were taken due to the absence of a scar marker and the presence of scar tissue, which initially raised concern. However, upon further imaging, no abnormalities were found. The patient's last MRI was in May of the previous year.         ALLERGIES:  is allergic to gold-containing drug products, adhesive [tape], and doxycycline.  MEDICATIONS:  Current Outpatient Medications  Medication Sig Dispense Refill   albuterol (VENTOLIN HFA) 108 (90 Base) MCG/ACT inhaler Inhale 1-2 puffs into the lungs every 6 (six) hours as needed for wheezing or shortness of breath. 8 g 3   ARIPiprazole (ABILIFY) 2 MG tablet TAKE ONE TABLET BY MOUTH ONCE A DAY 90 tablet 0   ASMANEX HFA 100 MCG/ACT AERO Inhale 2 puffs into the lungs 2 (two) times daily. 13 g 3   benzonatate (TESSALON PERLES) 100 MG capsule Take 2 capsules (200 mg total) by mouth 3 (three) times daily as needed for cough. 30 capsule 1   clobetasol ointment (TEMOVATE) 0.05 % Apply 1 application topically 2 (two) times daily. 30 g 1    dupilumab (DUPIXENT) 300 MG/2ML prefilled syringe INJECT 1 SYRINGE (300MG ) SUBCUTANEOUSLY EVERY 14 DAYS. 4 mL 5   fluticasone (FLONASE) 50 MCG/ACT nasal spray Place 1 spray into both nostrils daily. 15.8 mL 2   gabapentin (NEURONTIN) 300 MG capsule Take 1 capsule (300 mg total) by mouth 4 (four) times daily. 360 capsule 0   hydrOXYzine (VISTARIL) 25 MG capsule TAKE 1 CAPSULE (25 MG TOTAL) BY MOUTH DAILY AS NEEDED. FOR SEVERE ANXIETY SYMPTOMS 90 capsule 1   nicotine (NICODERM CQ - DOSED IN MG/24 HOURS) 14 mg/24hr patch Place 1 patch (14 mg total) onto the skin daily. 28 patch 2   nicotine polacrilex (NICORETTE) 2 MG gum Take 1 each (2 mg total) by mouth every 4 (four) hours while awake. 100 tablet 0   predniSONE (DELTASONE) 20 MG tablet take 2 tablets by mouth every morning for 7 days 14 tablet 1   solifenacin (VESICARE) 5 MG tablet Take by mouth.     tacrolimus (PROTOPIC) 0.1 % ointment Apply topically 2 (two) times daily. 100 g 2   tamoxifen (NOLVADEX) 10 MG tablet Take 0.5 tablets (5 mg total) by mouth daily. 90 tablet 1   venlafaxine XR (EFFEXOR-XR) 150 MG 24 hr capsule take 1 capsule by mouth daily with breakfast , along with 75mg  capsule 90 capsule 0   venlafaxine XR (EFFEXOR-XR) 75 MG 24 hr capsule Take 1 capsule by mouth daily with breakfast  along with 150 MG daily 90 capsule 0   Vitamin D, Ergocalciferol, (DRISDOL) 1.25 MG (  50000 UNIT) CAPS capsule Take 1 capsule (50,000 Units total) by mouth every 7 (seven) days. 12 capsule 0   No current facility-administered medications for this visit.    PHYSICAL EXAMINATION: ECOG PERFORMANCE STATUS: 1 - Symptomatic but completely ambulatory  Vitals:   10/31/23 1117  BP: 134/71  Pulse: 68  Resp: 17  Temp: (!) 97.3 F (36.3 C)  SpO2: 100%   Filed Weights   10/31/23 1117  Weight: 159 lb 9.6 oz (72.4 kg)    LABORATORY DATA:  I have reviewed the data as listed    Latest Ref Rng & Units 10/18/2023    4:14 PM 06/10/2021    9:19 AM  06/02/2021    3:18 PM  CMP  Glucose 70 - 99 mg/dL 87  75  84   BUN 6 - 23 mg/dL 5  5  <5   Creatinine 7.82 - 1.20 mg/dL 9.56  2.13  0.86   Sodium 135 - 145 mEq/L 137  139  136   Potassium 3.5 - 5.1 mEq/L 3.3  3.0  3.0   Chloride 96 - 112 mEq/L 102  102  100   CO2 19 - 32 mEq/L 28   27   Calcium 8.6 - 10.2 mg/dL 8.4 - 57.8 mg/dL 9.2    9.1   8.8   Total Protein 6.0 - 8.3 g/dL 7.3   7.4   Total Bilirubin 0.2 - 1.2 mg/dL 0.4   0.6   Alkaline Phos 39 - 117 U/L 56   57   AST 0 - 37 U/L 16   16   ALT 0 - 35 U/L 11   11     Lab Results  Component Value Date   WBC 5.9 10/18/2023   HGB 13.8 10/18/2023   HCT 41.8 10/18/2023   MCV 93.8 10/18/2023   PLT 270.0 10/18/2023   NEUTROABS 3.4 10/18/2023    ASSESSMENT & PLAN:  Complex sclerosing lesion of right breast Risk assessment: Tyrer-Cuzick 10-year risk: 6% (average risk 2.4%) Lifetime risk 27% (average risk 10.5%)   Recommendation: 1.  Tamoxifen  5 mg a day.  Started 07/30/2021 2. patient stays very active and exercise regularly.  Eats fruits and vegetables and does not eat too much red meat. ------------------------------------------------------------------------------------------------------------------------ Tamoxifen toxicities:No side effects Chronic fibromyalgia pain is same as before.   Breast cancer surveillance: Mammograms 12/08/2021: Benign breast density category C Breast MRI 04/29/2022: Benign breast density category C.  I recommended doing MRI every other year.   Return to clinic in 1 year for follow-up ------------------------------------- Assessment and Plan    High Risk for Breast Cancer On Tamoxifen 5mg  daily for 2+ years with good tolerance. No significant side effects reported. Mammogram in January of this year was normal, but required additional imaging due to lack of scar marker. Last MRI was in May of the previous year. -Continue Tamoxifen 5mg  daily. -Order Tamoxifen refill to be picked up at  [facility]. -Plan to switch from biennial MRI to annual contrast mammogram for surveillance starting January 2025, given the comparable image quality and lower cost and risk.  Fibromyalgia and Arthritis Chronic conditions, symptoms may be confounding potential side effects of Tamoxifen. -Continue current management plan.          Orders Placed This Encounter  Procedures   MM 2D DIAG BILAT WITH CONTRAST BCG ONLY    Standing Status:   Future    Standing Expiration Date:   10/30/2024    Order Specific Question:   Reason  for Exam (SYMPTOM  OR DIAGNOSIS REQUIRED)    Answer:   contrast mammograms with high risk for breast cancer    Order Specific Question:   If indicated for the ordered procedure, I authorize the administration of contrast media per Radiology protocol    Answer:   Yes    Order Specific Question:   Does the patient have a contrast media/X-ray dye allergy?    Answer:   No    Order Specific Question:   Is the patient pregnant?    Answer:   No    Order Specific Question:   Preferred Imaging Location?    Answer:   [facility]    Order Specific Question:   Release to patient    Answer:   Immediate   The patient has a good understanding of the overall plan. she agrees with it. she will call with any problems that may develop before the next visit here. Total time spent: 30 mins including face to face time and time spent for planning, charting and co-ordination of care   Tamsen Meek, MD 10/31/23

## 2023-11-07 ENCOUNTER — Encounter: Payer: Self-pay | Admitting: Cardiology

## 2023-11-07 ENCOUNTER — Ambulatory Visit: Payer: 59 | Admitting: Cardiology

## 2023-11-07 VITALS — BP 119/71 | HR 67 | Ht 69.0 in | Wt 160.2 lb

## 2023-11-07 DIAGNOSIS — B379 Candidiasis, unspecified: Secondary | ICD-10-CM | POA: Diagnosis not present

## 2023-11-07 DIAGNOSIS — Z1329 Encounter for screening for other suspected endocrine disorder: Secondary | ICD-10-CM | POA: Diagnosis not present

## 2023-11-07 DIAGNOSIS — Z131 Encounter for screening for diabetes mellitus: Secondary | ICD-10-CM

## 2023-11-07 DIAGNOSIS — Z13220 Encounter for screening for lipoid disorders: Secondary | ICD-10-CM | POA: Diagnosis not present

## 2023-11-07 HISTORY — DX: Candidiasis, unspecified: B37.9

## 2023-11-07 MED ORDER — FLUCONAZOLE 150 MG PO TABS: ORAL_TABLET | ORAL | 3 refills | Status: DC
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
## 2023-12-09 ENCOUNTER — Other Ambulatory Visit: Payer: Self-pay | Admitting: Cardiology
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
## 2023-12-19 ENCOUNTER — Ambulatory Visit: Payer: 59 | Admitting: Family Medicine

## 2023-12-19 ENCOUNTER — Encounter: Payer: Self-pay | Admitting: Family Medicine

## 2023-12-19 VITALS — BP 108/66 | HR 77 | Ht 69.0 in | Wt 163.0 lb

## 2023-12-19 DIAGNOSIS — M9902 Segmental and somatic dysfunction of thoracic region: Secondary | ICD-10-CM | POA: Diagnosis not present

## 2023-12-19 DIAGNOSIS — M9904 Segmental and somatic dysfunction of sacral region: Secondary | ICD-10-CM | POA: Diagnosis not present

## 2023-12-19 DIAGNOSIS — M9901 Segmental and somatic dysfunction of cervical region: Secondary | ICD-10-CM

## 2023-12-19 DIAGNOSIS — M9903 Segmental and somatic dysfunction of lumbar region: Secondary | ICD-10-CM | POA: Diagnosis not present

## 2023-12-19 DIAGNOSIS — M9908 Segmental and somatic dysfunction of rib cage: Secondary | ICD-10-CM | POA: Diagnosis not present

## 2023-12-19 DIAGNOSIS — M5030 Other cervical disc degeneration, unspecified cervical region: Secondary | ICD-10-CM

## 2023-12-19 NOTE — Patient Instructions (Signed)
Good to see you! No more homemade ladders See you again in 2-3 months

## 2023-12-19 NOTE — Assessment & Plan Note (Signed)
Continue degenerative disc disease.  History of fibromyalgia that is likely aggravating some of this as well.  Scapular dyskinesis noted as well.  Responding well though to osteopathic manipulation.  Encouraged her to try to do the exercises on a more regular basis.  Discussed icing regimen and home exercises.  Follow-up again in 2 to 3 months

## 2024-01-04 ENCOUNTER — Other Ambulatory Visit: Payer: Self-pay | Admitting: Cardiology

## 2024-01-14 ENCOUNTER — Other Ambulatory Visit: Payer: Self-pay | Admitting: Psychiatry

## 2024-01-14 DIAGNOSIS — F3342 Major depressive disorder, recurrent, in full remission: Secondary | ICD-10-CM

## 2024-01-14 DIAGNOSIS — F1590 Other stimulant use, unspecified, uncomplicated: Secondary | ICD-10-CM

## 2024-01-14 DIAGNOSIS — F418 Other specified anxiety disorders: Secondary | ICD-10-CM

## 2024-01-17 ENCOUNTER — Ambulatory Visit: Payer: 59 | Admitting: Psychiatry

## 2024-01-17 ENCOUNTER — Encounter: Payer: Self-pay | Admitting: Psychiatry

## 2024-01-17 VITALS — BP 118/78 | HR 63 | Temp 98.8°F | Ht 69.0 in | Wt 164.4 lb

## 2024-01-17 DIAGNOSIS — F17200 Nicotine dependence, unspecified, uncomplicated: Secondary | ICD-10-CM | POA: Diagnosis not present

## 2024-01-17 DIAGNOSIS — F3342 Major depressive disorder, recurrent, in full remission: Secondary | ICD-10-CM

## 2024-01-17 DIAGNOSIS — F1590 Other stimulant use, unspecified, uncomplicated: Secondary | ICD-10-CM

## 2024-01-17 DIAGNOSIS — F418 Other specified anxiety disorders: Secondary | ICD-10-CM

## 2024-01-17 DIAGNOSIS — G479 Sleep disorder, unspecified: Secondary | ICD-10-CM | POA: Diagnosis not present
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
## 2024-01-17 NOTE — Patient Instructions (Signed)

## 2024-01-26 ENCOUNTER — Other Ambulatory Visit: Payer: Self-pay | Admitting: Hematology and Oncology

## 2024-01-26 ENCOUNTER — Other Ambulatory Visit: Payer: Self-pay | Admitting: *Deleted

## 2024-01-26 DIAGNOSIS — N6489 Other specified disorders of breast: Secondary | ICD-10-CM

## 2024-01-26 NOTE — Progress Notes (Signed)
 Received call from pt with complaint of left breast tenderness.  Verbal orders received from MD for pt to receive left breast US .  Orders placed, GI notified to schedule pt.

## 2024-01-31 ENCOUNTER — Ambulatory Visit (INDEPENDENT_AMBULATORY_CARE_PROVIDER_SITE_OTHER): Payer: 59 | Admitting: Professional Counselor

## 2024-01-31 DIAGNOSIS — F3341 Major depressive disorder, recurrent, in partial remission: Secondary | ICD-10-CM | POA: Diagnosis not present

## 2024-01-31 DIAGNOSIS — F419 Anxiety disorder, unspecified: Secondary | ICD-10-CM | POA: Diagnosis not present
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
## 2024-02-01 ENCOUNTER — Other Ambulatory Visit: Payer: Self-pay | Admitting: Cardiology

## 2024-02-07 ENCOUNTER — Ambulatory Visit: Payer: 59 | Admitting: Cardiology

## 2024-02-13 ENCOUNTER — Ambulatory Visit: Payer: 59 | Admitting: Cardiology

## 2024-02-13 ENCOUNTER — Encounter: Payer: Self-pay | Admitting: Cardiology

## 2024-02-13 ENCOUNTER — Other Ambulatory Visit: Payer: 59

## 2024-02-13 VITALS — BP 112/64 | HR 83 | Ht 69.0 in | Wt 160.0 lb

## 2024-02-13 DIAGNOSIS — F17200 Nicotine dependence, unspecified, uncomplicated: Secondary | ICD-10-CM | POA: Diagnosis not present

## 2024-02-13 DIAGNOSIS — Z1329 Encounter for screening for other suspected endocrine disorder: Secondary | ICD-10-CM

## 2024-02-13 DIAGNOSIS — Z13220 Encounter for screening for lipoid disorders: Secondary | ICD-10-CM

## 2024-02-13 DIAGNOSIS — G4733 Obstructive sleep apnea (adult) (pediatric): Secondary | ICD-10-CM

## 2024-02-13 DIAGNOSIS — I10 Essential (primary) hypertension: Secondary | ICD-10-CM

## 2024-02-13 DIAGNOSIS — Z1211 Encounter for screening for malignant neoplasm of colon: Secondary | ICD-10-CM

## 2024-02-13 DIAGNOSIS — Z0130 Encounter for examination of blood pressure without abnormal findings: Secondary | ICD-10-CM

## 2024-02-13 DIAGNOSIS — Z131 Encounter for screening for diabetes mellitus: Secondary | ICD-10-CM
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
## 2024-02-14 LAB — HEMOGLOBIN A1C
Est. average glucose Bld gHb Est-mCnc: 105 mg/dL
Hgb A1c MFr Bld: 5.3 % (ref 4.8–5.6)

## 2024-02-14 LAB — LIPID PANEL
Chol/HDL Ratio: 4.6 {ratio} — ABNORMAL HIGH (ref 0.0–4.4)
Cholesterol, Total: 182 mg/dL (ref 100–199)
HDL: 40 mg/dL (ref >39)
LDL Chol Calc (NIH): 119 mg/dL — ABNORMAL HIGH (ref 0–99)
Triglycerides: 129 mg/dL (ref 0–149)
VLDL Cholesterol Cal: 23 mg/dL (ref 5–40)

## 2024-02-14 LAB — CMP14+EGFR
ALT: 8 [IU]/L (ref 0–32)
AST: 13 [IU]/L (ref 0–40)
Albumin: 3.8 g/dL — ABNORMAL LOW (ref 3.9–4.9)
Alkaline Phosphatase: 79 [IU]/L (ref 44–121)
BUN/Creatinine Ratio: 6 — ABNORMAL LOW (ref 9–23)
BUN: 5 mg/dL — ABNORMAL LOW (ref 6–24)
Bilirubin Total: 0.3 mg/dL (ref 0.0–1.2)
CO2: 23 mmol/L (ref 20–29)
Calcium: 8.9 mg/dL (ref 8.7–10.2)
Chloride: 103 mmol/L (ref 96–106)
Creatinine, Ser: 0.77 mg/dL (ref 0.57–1.00)
Globulin, Total: 2.7 g/dL (ref 1.5–4.5)
Glucose: 87 mg/dL (ref 70–99)
Potassium: 4.2 mmol/L (ref 3.5–5.2)
Sodium: 140 mmol/L (ref 134–144)
Total Protein: 6.5 g/dL (ref 6.0–8.5)
eGFR: 95 mL/min/{1.73_m2} (ref >59)

## 2024-02-14 LAB — TSH: TSH: 1.84 u[IU]/mL (ref 0.450–4.500)

## 2024-02-14 NOTE — Progress Notes (Signed)
 Informed via Mychart message

## 2024-02-15 ENCOUNTER — Telehealth: Payer: Self-pay

## 2024-02-15 DIAGNOSIS — Z1211 Encounter for screening for malignant neoplasm of colon: Secondary | ICD-10-CM

## 2024-02-15 NOTE — Telephone Encounter (Signed)
 Gastroenterology Pre-Procedure Review  Request Date: TBD Requesting Physician: Dr. Jodelle Gross  PATIENT REVIEW QUESTIONS: The patient responded to the following health history questions as indicated:    1. Are you having any GI issues? no 2. Do you have a personal history of Polyps? yes (Pt stated she has had colon polyps.  No record of this in chart.  Referral noted "screening colonoscopy") 3. Do you have a family history of Colon Cancer or Polyps? no 4. Diabetes Mellitus? no 5. Joint replacements in the past 12 months?no 6. Major health problems in the past 3 months?no 7. Any artificial heart valves, MVP, or defibrillator?no 8. Cardiac health? Yes MVP Cardiologist Dr. Adrian Blackwater.  Clearance sent to office via in basket. Fax [phone]    MEDICATIONS & ALLERGIES:    Patient reports the following regarding taking any anticoagulation/antiplatelet therapy:   Plavix, Coumadin, Eliquis, Xarelto, Lovenox, Pradaxa, Brilinta, or Effient? no Aspirin? no  Patient confirms/reports the following medications:  Current Outpatient Medications  Medication Sig Dispense Refill   albuterol (VENTOLIN HFA) 108 (90 Base) MCG/ACT inhaler INHALE ONE TO TWO PUFFS BY MOUTH INTO THE LUNGS EVERY 6 HOURS  AS NEEDED FOR WHEEZING OR SHORTNESS OF BREATH 8.5 g 0   ARIPiprazole (ABILIFY) 2 MG tablet TAKE ONE TABLET BY MOUTH ONCE A DAY 90 tablet 0   ASMANEX HFA 100 MCG/ACT AERO Inhale 2 puffs into the lungs 2 (two) times daily. 13 g 3   cetirizine (ZYRTEC) 10 MG tablet Take by mouth.     clobetasol ointment (TEMOVATE) 0.05 % Apply 1 application topically 2 (two) times daily. 30 g 1   dupilumab (DUPIXENT) 300 MG/2ML prefilled syringe INJECT 1 SYRINGE (300MG ) SUBCUTANEOUSLY EVERY 14 DAYS. 4 mL 5   fluticasone (FLONASE) 50 MCG/ACT nasal spray Place 1 spray into both nostrils daily. 15.8 mL 2   gabapentin (NEURONTIN) 300 MG capsule Take 1 capsule (300 mg total) by mouth 4 (four) times daily. 360 capsule 0   hydrOXYzine  (VISTARIL) 25 MG capsule TAKE 1 CAPSULE (25 MG TOTAL) BY MOUTH DAILY AS NEEDED. FOR SEVERE ANXIETY SYMPTOMS 90 capsule 1   montelukast (SINGULAIR) 10 MG tablet Take 10 mg by mouth daily.     nicotine (NICODERM CQ - DOSED IN MG/24 HOURS) 14 mg/24hr patch Place 1 patch (14 mg total) onto the skin daily. 28 patch 2   nicotine polacrilex (NICORETTE) 2 MG gum Take 1 each (2 mg total) by mouth every 4 (four) hours while awake. 100 tablet 0   solifenacin (VESICARE) 5 MG tablet Take by mouth.     tacrolimus (PROTOPIC) 0.1 % ointment Apply topically 2 (two) times daily. 100 g 2   tamoxifen (NOLVADEX) 10 MG tablet Take 0.5 tablets (5 mg total) by mouth daily. 90 tablet 1   venlafaxine XR (EFFEXOR-XR) 150 MG 24 hr capsule TAKE 1 CAPSULE BY MOUTH DAILY WITH BREAKFAST , ALONG WITH 75MG  CAPSULE 90 capsule 0   venlafaxine XR (EFFEXOR-XR) 75 MG 24 hr capsule TAKE 1 CAPSULE BY MOUTH DAILY WITH BREAKFAST  ALONG WITH 150 MG DAILY 90 capsule 0   Vitamin D, Ergocalciferol, (DRISDOL) 1.25 MG (50000 UNIT) CAPS capsule Take 1 capsule (50,000 Units total) by mouth every 7 (seven) days. 12 capsule 0   No current facility-administered medications for this visit.    Patient confirms/reports the following allergies:  Allergies  Allergen Reactions   Gold-Containing Drug Products Dermatitis   Adhesive [Tape] Rash   Doxycycline Rash    No orders of the defined  types were placed in this encounter.   AUTHORIZATION INFORMATION Primary Insurance: 1D#: Group #:  Secondary Insurance: 1D#: Group #:  SCHEDULE INFORMATION: Date: TBD Time: Location: [facility]

## 2024-02-17 NOTE — Telephone Encounter (Signed)
 Cardiac clearance received from Dr. Milta Deiters office on 02/15/24. Patient is cleared to have procedure.   Call patient next week to schedule.  Thanks,  [place], [place]

## 2024-02-20 NOTE — Telephone Encounter (Signed)
 Contacted patient to let her know that we have received her cardiac clearance to move forward with scheduling her colonoscopy.  She has requested to call back tomorrow.  Thanks,  [place], [place]

## 2024-02-24 ENCOUNTER — Ambulatory Visit
Admission: RE | Admit: 2024-02-24 | Discharge: 2024-02-24 | Disposition: A | Discharge status: Home / Self Care | Payer: 59 | Source: Ambulatory Visit | Attending: Hematology and Oncology | Admitting: Hematology and Oncology

## 2024-02-24 ENCOUNTER — Ambulatory Visit: Payer: 59

## 2024-02-24 DIAGNOSIS — N6489 Other specified disorders of breast: Secondary | ICD-10-CM

## 2024-02-27 ENCOUNTER — Ambulatory Visit (INDEPENDENT_AMBULATORY_CARE_PROVIDER_SITE_OTHER): Payer: 59 | Admitting: Professional Counselor

## 2024-02-27 DIAGNOSIS — F3341 Major depressive disorder, recurrent, in partial remission: Secondary | ICD-10-CM | POA: Diagnosis not present

## 2024-02-27 DIAGNOSIS — F419 Anxiety disorder, unspecified: Secondary | ICD-10-CM | POA: Diagnosis not present

## 2024-02-27 NOTE — Progress Notes (Signed)
 THERAPIST PROGRESS NOTE  Virtual Visit via Video Note  I connected with Kristy Shaw on 02/27/24 at  4:00 PM EDT by a video enabled telemedicine application and verified that I am speaking with the correct person using two identifiers.  Location: Patient: Home Provider: Office   I discussed the limitations of evaluation and management by telemedicine and the availability of in person appointments. The patient expressed understanding and agreed to proceed.  I discussed the assessment and treatment plan with the patient. The patient was provided an opportunity to ask questions and all were answered. The patient agreed with the plan and demonstrated an understanding of the instructions.   The patient was advised to call back or seek an in-person evaluation if the symptoms worsen or if the condition fails to improve as anticipated.  I provided 48 minutes of non-face-to-face time during this encounter. Kristy Shaw  Session Time: 4:01 PM - 4:49 PM  Participation Level: Active  Behavioral Response: Casual, Alert, Anxious and Dysphoric  Type of Therapy: Individual Therapy  Treatment Goals addressed: Active OP Depression  LTG: "Feeling more comfortable, less anxious with things of the unknown. I'm very methodical. I will think of a scenario and play it out every which way to try and figure out how I would react to it."    Start:  02/27/24    Expected End:  02/25/25     STG: "I've never really had a close family member pass. It's really hard for me to display certain emotions." To improve emotional intelligence AEB identifying and processing emotions over the next 12 weeks.    STG: "I need to schedule my sleep study." To improve wellness AEB attending health appointments, engaging in healthy activities and self-care over the next 12 weeks.    STG: "I want to quit smoking." To achieve smoking cessation AEB self-report of using positive replacement activities over the  next 12 weeks.    ProgressTowards Goals: Initial  Interventions: CBT  Summary: Kristy Shaw is a 48 y.o. female who presents with a history of anxiety and depression. She appeared alert and oriented x5. She was casually dressed and well-groomed. She stated things are going okay. Kristy Shaw reported her father's birthday is this weekend and it's also a year since he's been diagnosed with cancer. They are planning a small family gathering to celebrate. Kristy Shaw engaged in developing her treatment plan. She provided verbal consent for treatment plan consent form to be signed. Kristy Shaw noted she wants to start with working on emotions. She engaged in discussion and writing exercise to identify what she has control over. She took note on "name it to tame it" to help with self-identification of emotions and also to help teach her children about emotions. She reported this will be helpful and she will try to practice between now and next session.   Therapist Response: Conducted session with Kristy Shaw. Began session with check-in/update since previous session. Utilized empathetic and reflective listening. Developed treatment plan with input from Kristy Shaw on current strengths, needs, and progress towards goals. Obtained verbal consent to sign treatment plan consent form. Used open-ended questions to facilitate discussion and identify Kristy Shaw's thoughts about emotions and how emotions were handled in her childhood. Engaged in writing exercise - donut/circles of control and assisted with identifying things within and outside of Kristy Shaw's control. Explained "name it to tame it" to assist with emotion identification and validation. Encouraged Kristy Shaw to use skills between now and next session. Scheduled additional appointment and  concluded session.   Suicidal/Homicidal: No  Plan: Return again in 2 weeks.  Diagnosis: MDD (major depressive disorder), recurrent, in partial remission (HCC)  Anxiety disorder, unspecified  type  Collaboration of Care: Medication Management AEB chart review   Patient/Guardian was advised Release of Information must be obtained prior to any record release in order to collaborate their care with an outside provider. Patient/Guardian was advised if they have not already done so to contact the registration department to sign all necessary forms in order for [place] to release information regarding their care.   Consent: Patient/Guardian gives verbal consent for treatment and assignment of benefits for services provided during this visit. Patient/Guardian expressed understanding and agreed to proceed.   Kristy Shaw, [facility] 02/27/2024
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
## 2024-02-29 ENCOUNTER — Other Ambulatory Visit: Payer: Self-pay

## 2024-02-29 ENCOUNTER — Ambulatory Visit: Payer: 59 | Admitting: Family Medicine

## 2024-02-29 VITALS — BP 110/80 | HR 75 | Ht 69.0 in | Wt 160.0 lb

## 2024-02-29 DIAGNOSIS — M9902 Segmental and somatic dysfunction of thoracic region: Secondary | ICD-10-CM

## 2024-02-29 DIAGNOSIS — M7711 Lateral epicondylitis, right elbow: Secondary | ICD-10-CM | POA: Insufficient documentation

## 2024-02-29 DIAGNOSIS — M25521 Pain in right elbow: Secondary | ICD-10-CM | POA: Diagnosis not present

## 2024-02-29 DIAGNOSIS — M9908 Segmental and somatic dysfunction of rib cage: Secondary | ICD-10-CM

## 2024-02-29 DIAGNOSIS — M9901 Segmental and somatic dysfunction of cervical region: Secondary | ICD-10-CM | POA: Diagnosis not present

## 2024-02-29 DIAGNOSIS — M9904 Segmental and somatic dysfunction of sacral region: Secondary | ICD-10-CM

## 2024-02-29 DIAGNOSIS — M5030 Other cervical disc degeneration, unspecified cervical region: Secondary | ICD-10-CM

## 2024-02-29 DIAGNOSIS — M9903 Segmental and somatic dysfunction of lumbar region: Secondary | ICD-10-CM

## 2024-02-29 NOTE — Assessment & Plan Note (Signed)
 Known arthritic changes, exacerbation recently, discussed different medications including anti-inflammatories such as ibuprofen 600 mg.  Discussed with patient about home exercises, icing regimen, posture and ergonomics.  Responding well to manipulation.  Follow-up again in 2 months

## 2024-02-29 NOTE — Patient Instructions (Addendum)
 Exercises Wrist brace day and night for 2 weeks then at night for 2 weeks Ice See me again in 2 months

## 2024-02-29 NOTE — Assessment & Plan Note (Signed)
 Lateral epicondylitis noted.  Discussed icing regimen and home exercises, which activities to do and which ones to avoid.  Increase activity slowly.  Discussed icing regimen and home exercises.  Discussed a wrist brace which was given today and avoid repetitive extension of the wrist.  Worsening pain at follow-up consider injection

## 2024-03-01 ENCOUNTER — Encounter: Payer: Self-pay | Admitting: Family Medicine

## 2024-03-02 ENCOUNTER — Other Ambulatory Visit: Payer: Self-pay | Admitting: Cardiology

## 2024-03-06 ENCOUNTER — Encounter: Payer: Self-pay | Admitting: *Deleted

## 2024-03-12 ENCOUNTER — Ambulatory Visit: Payer: 59 | Admitting: Professional Counselor

## 2024-03-12 DIAGNOSIS — F3341 Major depressive disorder, recurrent, in partial remission: Secondary | ICD-10-CM

## 2024-03-12 NOTE — Progress Notes (Unsigned)
 THERAPIST PROGRESS NOTE  Virtual Visit via Video Note  I connected with Kristy Shaw on 03/13/24 at  4:00 PM EDT by a video enabled telemedicine application and verified that I am speaking with the correct person using two identifiers.  Location: Patient: Home Provider: Home office   I discussed the limitations of evaluation and management by telemedicine and the availability of in person appointments. The patient expressed understanding and agreed to proceed.  I discussed the assessment and treatment plan with the patient. The patient was provided an opportunity to ask questions and all were answered. The patient agreed with the plan and demonstrated an understanding of the instructions.   The patient was advised to call back or seek an in-person evaluation if the symptoms worsen or if the condition fails to improve as anticipated.  I provided 51 minutes of non-face-to-face time during this encounter. Edmonia Lynch, [facility]  Session Time: 4:10 PM - 5:01 PM  Participation Level: Active  Behavioral Response: Casual, Alert, Anxious  Type of Therapy: Individual Therapy  Treatment Goals addressed: Active OP Depression  LTG: "Feeling more comfortable, less anxious with things of the unknown. I'm very methodical. I will think of a scenario and play it out every which way to try and figure out how I would react to it."                Start:  02/27/24    Expected End:  02/25/25      STG: "I've never really had a close family member pass. It's really hard for me to display certain emotions." To improve emotional intelligence AEB identifying and processing emotions over the next 12 weeks.     STG: "I need to schedule my sleep study." To improve wellness AEB attending health appointments, engaging in healthy activities and self-care over the next 12 weeks.     STG: "I want to quit smoking." To achieve smoking cessation AEB self-report of using positive replacement activities over  the next 12 weeks.    ProgressTowards Goals: Progressing  Interventions: CBT, Assertiveness Training, and Supportive  Summary: Kristy Shaw is a 48 y.o. female who presents with a history of anxiety and depression. She appeared alert and oriented x5. She stated things are going okay. Dante reported a situation with her sister and expressed uncertainty about how she handled it and/or how she should handle similar situations moving forward. She was receptive to focusing on her donut and reported that has been a helpful tool overall. She took notes on DEAR MAN skill. She plans to write out what she wants to say using this as a guideline.   Therapist Response: Conducted session with Kristy Shaw. Began session with check-in/update since previous session. Utilized empathetic and reflective listening. Reminded Rosselyn of her donut and what's within her control. Explained DEAR MAN skill and differences between boundaries and requests. Confirmed next appointment and concluded session.   Suicidal/Homicidal: No  Plan: Return again in 2 weeks.  Diagnosis: MDD (major depressive disorder), recurrent, in partial remission (HCC)  Collaboration of Care: Medication Management AEB chart review  Patient/Guardian was advised Release of Information must be obtained prior to any record release in order to collaborate their care with an outside provider. Patient/Guardian was advised if they have not already done so to contact the registration department to sign all necessary forms in order for [place] to release information regarding their care.   Consent: Patient/Guardian gives verbal consent for treatment and assignment of benefits for services provided  during this visit. Patient/Guardian expressed understanding and agreed to proceed.   Edmonia Lynch, [facility] 03/13/2024

## 2024-03-26 ENCOUNTER — Ambulatory Visit: Payer: 59 | Admitting: Professional Counselor

## 2024-04-04 ENCOUNTER — Other Ambulatory Visit: Payer: Self-pay | Admitting: Dermatology

## 2024-04-04 DIAGNOSIS — L2089 Other atopic dermatitis: Secondary | ICD-10-CM

## 2024-04-09 ENCOUNTER — Ambulatory Visit (INDEPENDENT_AMBULATORY_CARE_PROVIDER_SITE_OTHER): Admitting: Professional Counselor

## 2024-04-09 DIAGNOSIS — F419 Anxiety disorder, unspecified: Secondary | ICD-10-CM

## 2024-04-09 NOTE — Progress Notes (Signed)
 THERAPIST PROGRESS NOTE  Virtual Visit via Video Note  I connected with Kristy Shaw on 04/09/24 at  4:00 PM EDT by a video enabled telemedicine application and verified that I am speaking with the correct person using two identifiers.  Location: Patient: Home Provider: Office   I discussed the limitations of evaluation and management by telemedicine and the availability of in person appointments. The patient expressed understanding and agreed to proceed.  I discussed the assessment and treatment plan with the patient. The patient was provided an opportunity to ask questions and all were answered. The patient agreed with the plan and demonstrated an understanding of the instructions.   The patient was advised to call back or seek an in-person evaluation if the symptoms worsen or if the condition fails to improve as anticipated.  I provided 53 minutes of non-face-to-face time during this encounter. Len Quale, [facility]  Session Time: 4:06 PM - 4:59 PM   Participation Level: Active  Behavioral Response: Casual, Alert, Anxious  Type of Therapy: Individual Therapy  Treatment Goals addressed: Active OP Depression  LTG: "Feeling more comfortable, less anxious with things of the unknown. I'm very methodical. I will think of a scenario and play it out every which way to try and figure out how I would react to it."                Start:  02/27/24    Expected End:  02/25/25      STG: "I've never really had a close family member pass. It's really hard for me to display certain emotions." To improve emotional intelligence AEB identifying and processing emotions over the next 12 weeks.     STG: "I need to schedule my sleep study." To improve wellness AEB attending health appointments, engaging in healthy activities and self-care over the next 12 weeks.     STG: "I want to quit smoking." To achieve smoking cessation AEB self-report of using positive replacement activities over the  next 12 weeks.   ProgressTowards Goals: Progressing  Interventions: Assertiveness Training  Summary: Kristy Shaw is a 48 y.o. female who presents with a history of anxiety and depression. She appeared alert and oriented x5. She stated things are okay. Gussie reported she hasn't been able to practice DEAR MAN as much as she should have. She engaged in practice during session and was open to feedback. She expressed her struggle with boundaries more with her sister than others. Gresia explored contributing factors to this relationship dynamic. She reported she needs to be more direct and also identify exactly where her limitations are.   Therapist Response: Conducted session with [name]. Began session with check-in/update since previous session. Utilized empathetic and reflective listening. Engaged in practicing DEAR MAN skill in various scenarios and provided feedback by modeling skill. Explored relationship dynamics with her sister. Encouraged STOP skill to help reduce emotional reactions. Scheduled additional appointment and concluded session.   Suicidal/Homicidal: No  Plan: Return again in 6 weeks.  Diagnosis: Anxiety disorder, unspecified type  Collaboration of Care: Medication Management AEB chart review  Patient/Guardian was advised Release of Information must be obtained prior to any record release in order to collaborate their care with an outside provider. Patient/Guardian was advised if they have not already done so to contact the registration department to sign all necessary forms in order for us  to release information regarding their care.   Consent: Patient/Guardian gives verbal consent for treatment and assignment of benefits for services provided during  this visit. Patient/Guardian expressed understanding and agreed to proceed.   Len Quale, [facility] 04/09/2024

## 2024-04-10 ENCOUNTER — Other Ambulatory Visit: Payer: Self-pay | Admitting: Psychiatry

## 2024-04-10 DIAGNOSIS — F3342 Major depressive disorder, recurrent, in full remission: Secondary | ICD-10-CM

## 2024-04-10 DIAGNOSIS — F418 Other specified anxiety disorders: Secondary | ICD-10-CM

## 2024-04-16 ENCOUNTER — Encounter: Payer: Self-pay | Admitting: Psychiatry

## 2024-04-16 ENCOUNTER — Telehealth (INDEPENDENT_AMBULATORY_CARE_PROVIDER_SITE_OTHER): Payer: Self-pay | Admitting: Psychiatry

## 2024-04-16 ENCOUNTER — Telehealth: Admitting: Psychiatry

## 2024-04-16 DIAGNOSIS — G479 Sleep disorder, unspecified: Secondary | ICD-10-CM | POA: Diagnosis not present

## 2024-04-16 DIAGNOSIS — F419 Anxiety disorder, unspecified: Secondary | ICD-10-CM

## 2024-04-16 DIAGNOSIS — F3342 Major depressive disorder, recurrent, in full remission: Secondary | ICD-10-CM | POA: Diagnosis not present

## 2024-04-16 DIAGNOSIS — F17210 Nicotine dependence, cigarettes, uncomplicated: Secondary | ICD-10-CM

## 2024-04-16 DIAGNOSIS — F418 Other specified anxiety disorders: Secondary | ICD-10-CM | POA: Diagnosis not present

## 2024-04-16 DIAGNOSIS — F1590 Other stimulant use, unspecified, uncomplicated: Secondary | ICD-10-CM

## 2024-04-16 DIAGNOSIS — F17200 Nicotine dependence, unspecified, uncomplicated: Secondary | ICD-10-CM

## 2024-04-16 MED ORDER — ARIPIPRAZOLE 2 MG PO TABS: 2.0000 mg | ORAL_TABLET | Freq: Every day | ORAL | 1 refills | Status: DC

## 2024-04-16 MED ORDER — VENLAFAXINE HCL ER 75 MG PO CP24: ORAL_CAPSULE | ORAL | 1 refills | Status: DC

## 2024-04-16 NOTE — Progress Notes (Signed)
 Connected with patient at the time of visit however patient reported her sister is currently in a crisis and hence she wants to do a welfare check on her and maybe get her help if needed, would like this appointment rescheduled.  Patient would like to see this provider possibly today.  Patient offered 4 PM today and agrees to do this visit at 4 PM.
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
## 2024-04-23 ENCOUNTER — Ambulatory Visit: Payer: 59 | Admitting: Dermatology

## 2024-04-23 DIAGNOSIS — L2089 Other atopic dermatitis: Secondary | ICD-10-CM | POA: Diagnosis not present

## 2024-04-23 DIAGNOSIS — L281 Prurigo nodularis: Secondary | ICD-10-CM | POA: Diagnosis not present

## 2024-04-23 DIAGNOSIS — Z7189 Other specified counseling: Secondary | ICD-10-CM

## 2024-04-23 DIAGNOSIS — Z79899 Other long term (current) drug therapy: Secondary | ICD-10-CM | POA: Diagnosis not present

## 2024-04-23 MED ORDER — DUPIXENT 300 MG/2ML ~~LOC~~ SOSY: PREFILLED_SYRINGE | SUBCUTANEOUS | 5 refills | Status: DC

## 2024-04-23 NOTE — Patient Instructions (Addendum)
 Dupilumab  (Dupixent ) is a treatment given by injection for adults and children with moderate-to-severe atopic dermatitis. Goal is control of skin condition, not cure. It is given as 2 injections at the first dose followed by 1 injection ever 2 weeks thereafter.  Young children are dosed monthly.  Potential side effects include allergic reaction, herpes infections, injection site reactions and conjunctivitis (inflammation of the eyes).  The use of Dupixent  requires long term medication management, including periodic office visits.  For dry, irritated eyes, recommend OTC artificial tears lubricating eye drops, such as Natural Tears or Systane, and apply as needed during the day and before bed.  If that is not effective, can add OTC ketotifen eye drops (Zaditor) twice daily.   Due to recent changes in healthcare laws, you may see results of your pathology and/or laboratory studies on MyChart before the doctors have had a chance to review them. We understand that in some cases there may be results that are confusing or concerning to you. Please understand that not all results are received at the same time and often the doctors may need to interpret multiple results in order to provide you with the best plan of care or course of treatment. Therefore, we ask that you please give us  2 business days to thoroughly review all your results before contacting the office for clarification. Should we see a critical lab result, you will be contacted sooner.   If You Need Anything After Your Visit  If you have any questions or concerns for your doctor, please call our main line at [phone] and press option 4 to reach your doctor's medical assistant. If no one answers, please leave a voicemail as directed and we will return your call as soon as possible. Messages left after 4 pm will be answered the following business day.   You may also send us  a message via MyChart. We typically respond to MyChart messages within  1-2 business days.  For prescription refills, please ask your pharmacy to contact our office. Our fax number is [phone].  If you have an urgent issue when the clinic is closed that cannot wait until the next business day, you can page your doctor at the number below.    Please note that while we do our best to be available for urgent issues outside of office hours, we are not available 24/7.   If you have an urgent issue and are unable to reach us , you may choose to seek medical care at your doctor's office, retail clinic, urgent care center, or emergency room.  If you have a medical emergency, please immediately call 911 or go to the emergency department.  Pager Numbers  - Dr. Bary Likes: [phone]  - Dr. Annette Barters: [phone]  - Dr. Felipe Horton: [phone]   In the event of inclement weather, please call our main line at [phone] for an update on the status of any delays or closures.  Dermatology Medication Tips: Please keep the boxes that topical medications come in in order to help keep track of the instructions about where and how to use these. Pharmacies typically print the medication instructions only on the boxes and not directly on the medication tubes.   If your medication is too expensive, please contact our office at [phone] option 4 or send us  a message through MyChart.   We are unable to tell what your co-pay for medications will be in advance as this is different depending on your insurance coverage. However, we may be able to  find a substitute medication at lower cost or fill out paperwork to get insurance to cover a needed medication.   If a prior authorization is required to get your medication covered by your insurance company, please allow us  1-2 business days to complete this process.  Drug prices often vary depending on where the prescription is filled and some pharmacies may offer cheaper prices.  The website www.goodrx.com contains coupons for  medications through different pharmacies. The prices here do not account for what the cost may be with help from insurance (it may be cheaper with your insurance), but the website can give you the price if you did not use any insurance.  - You can print the associated coupon and take it with your prescription to the pharmacy.  - You may also stop by our office during regular business hours and pick up a GoodRx coupon card.  - If you need your prescription sent electronically to a different pharmacy, notify our office through [facility] or by phone at [phone] option 4.     Si Usted Necesita Algo Despus de Su Visita  Tambin puede enviarnos un mensaje a travs de Clinical cytogeneticist. Por lo general respondemos a los mensajes de MyChart en el transcurso de 1 a 2 das hbiles.  Para renovar recetas, por favor pida a su farmacia que se ponga en contacto con nuestra oficina. Franz Jacks de fax es [place] [phone].  Si tiene un asunto urgente cuando la clnica est cerrada y que no puede esperar hasta el siguiente da hbil, puede llamar/localizar a su doctor(a) al nmero que aparece a continuacin.   Por favor, tenga en cuenta que aunque hacemos todo lo posible para estar disponibles para asuntos urgentes fuera del horario de [place], no estamos disponibles las 24 horas del da, los 7 [address]  [address] de la [place].   Si tiene un problema urgente y no puede comunicarse con nosotros, puede optar por buscar atencin mdica  en el consultorio de su doctor(a), en una clnica privada, en un centro de atencin urgente o en una sala de emergencias.  Si tiene Engineer, drilling, por favor llame inmediatamente al 911 o vaya a la sala de emergencias.  Nmeros de bper  - Dr. Bary Likes: [phone]  - Dra. Annette Barters: [phone]  - Dr. Felipe Horton: [phone]   En caso de inclemencias del tiempo, por favor llame a Lajuan Pila principal al [phone] para una actualizacin sobre el [place] de cualquier retraso  o cierre.  Consejos para la medicacin en dermatologa: Por favor, guarde las cajas en las que vienen los medicamentos de uso tpico para ayudarle a seguir las instrucciones sobre dnde y cmo usarlos. Las farmacias generalmente imprimen las instrucciones del medicamento slo en las cajas y no directamente en los tubos del [place].   Si su medicamento es muy caro, por favor, pngase en contacto con Bettyjane Brunet llamando al [phone] y presione la opcin 4 o envenos un mensaje a travs de Clinical cytogeneticist.   No podemos decirle cul ser su copago por los medicamentos por adelantado ya que esto es diferente dependiendo de la cobertura de su seguro. Sin embargo, es posible que podamos encontrar un medicamento sustituto a Audiological scientist un formulario para que el seguro cubra el medicamento que se considera necesario.   Si se requiere una autorizacin previa para que su compaa de seguros [place] su medicamento, por favor permtanos de 1 a 2 das hbiles para completar este proceso.  Los precios de los medicamentos varan con frecuencia  dependiendo del lugar de dnde se surte la receta y alguna farmacias pueden ofrecer precios ms baratos.  El sitio web www.goodrx.com tiene cupones para medicamentos de Health and safety inspector. Los precios aqu no tienen en cuenta lo que podra costar con la ayuda del seguro (puede ser ms barato con su seguro), pero el sitio web puede darle el precio si no utiliz Tourist information centre manager.  - Puede imprimir el cupn correspondiente y llevarlo con su receta a la farmacia.  - Tambin puede pasar por nuestra oficina durante el horario de atencin regular y Education officer, museum una tarjeta de cupones de GoodRx.  - Si necesita que su receta se enve electrnicamente a una farmacia diferente, informe a nuestra oficina a travs de MyChart de [place] o por telfono llamando al [phone] y presione la opcin 4.

## 2024-04-23 NOTE — Progress Notes (Signed)
   Follow-Up Visit   Subjective  Kristy Shaw is a 48 y.o. female who presents for the following: 6 month follow-up Atopic Dermatitis/Dupixent . She has TMC 0.1% cream, tacrolimus  ointment, and clobetasol /CeraVe mix for flares. Decreased flares. Still has few scattered itchy bumps. No eye irritation.    The following portions of the chart were reviewed this encounter and updated as appropriate: medications, allergies, medical history  Review of Systems:  No other skin or systemic complaints except as noted in HPI or Assessment and Plan.  Objective  Well appearing patient in no apparent distress; mood and affect are within normal limits.  Areas Examined: Face, arms  Relevant physical exam findings are noted in the Assessment and Plan.    Assessment & Plan    OTHER ATOPIC DERMATITIS   Related Medications tacrolimus  (PROTOPIC ) 0.1 % ointment Apply topically 2 (two) times daily. dupilumab  (DUPIXENT ) 300 MG/2ML prefilled syringe INJECT 1 SYRINGE (300MG ) SUBCUTANEOUSLY EVERY 14 DAYS. ATOPIC DERMATITIS with PRURIGO NODULES Exam: Healing excoriated papules at upper forehead, right post shoulder, R post auricular neck 1% BSA  Chronic and persistent condition with duration or expected duration over one year. Condition is improving with treatment but not currently at goal. Pt on Dupixent .   Atopic dermatitis - Severe, on Dupixent  (biologic medication).  Atopic dermatitis (eczema) is a chronic, relapsing, pruritic condition that can significantly affect quality of life. It is often associated with allergic rhinitis and/or asthma and can require treatment with topical medications, phototherapy, or in severe cases biologic medications, which require long term medication management.     Treatment Plan:  Continue Dupixent  300 mg/2mL SQ QOW. Patient denies side effects.  Recommend TMC 0.1% cream to spot treat itchy bumps on body as needed. Avoid applying to face, groin, and axilla.  Use as directed. Long-term use can cause thinning of the skin. Continue tacrolimus  ointment 0.1 % - apply topically to aa of face and body bid prn for itchy spots  Continue clobetasol /CeraVe mix qd/bid or Clobetasol  ointment prn flares. Avoid face, groin, axilla.  Avoid picking/rubbing/scratching so bumps can heal  Potential side effects include allergic reaction, herpes infections, injection site reactions and conjunctivitis (inflammation of the eyes).  The use of Dupixent  requires long term medication management, including periodic office visits.    Long term medication management.  Patient is using long term (months to years) prescription medication  to control their dermatologic condition.  These medications require periodic monitoring to evaluate for efficacy and side effects and may require periodic laboratory monitoring.   Recommend gentle skin care.   Return in about 6 months (around 10/24/2024) for Atopic Dermatitis.  IBernardine Bridegroom, CMA, am acting as scribe for Artemio Larry, MD .   Documentation: I have reviewed the above documentation for accuracy and completeness, and I agree with the above.  Artemio Larry, MD

## 2024-04-24 NOTE — Progress Notes (Unsigned)
 [facility] [address] [place] [address] Phone: [phone] Subjective:   Kristy Shaw, am serving as a scribe for Dr. Ronnell Shaw.   I'm seeing this patient by the request  of:  Kristy Hove, MD  CC: Back and neck pain follow-up  [id number]  Kristy Shaw is a 48 y.o. female coming in with complaint of back and neck pain. OMT on 02/29/2024. Also seen for elbow pain. Patient states hurt herself Monday coughing. L rib pain. Deep breathe and stretching hurts. Elbow pain is better. When she stopped wearing brace to bed elbow feels like its locked in the AM.  Medications patient has been prescribed:   Taking:         Reviewed prior external information including notes and imaging from previsou exam, outside providers and external EMR if available.   As well as notes that were available from care everywhere and other healthcare systems.  Past medical history, social, surgical and family history all reviewed in electronic medical record.  No pertanent information unless stated regarding to the chief complaint.   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[facility])    Depression    Dyspnea    Dysrhythmia    IRREGULAR HEART BEAT   Family history of adverse reaction to anesthesia    DAD-STOKE COMING OUT OF ANESTHESIA    Family history of breast cancer    Fatigue    Fibromyalgia    GERD (gastroesophageal reflux disease)    OCC   IBS (irritable bowel syndrome)    Mitral valve prolapse    Restless leg syndrome    Sleep apnea    HAS CPAP MACHINE BUT DOES NOT USE ON A REGULAR BASIS    Allergies  Allergen Reactions   Gold-Containing Drug Products Dermatitis   Adhesive [Tape] Rash   Doxycycline  Rash     Review of Systems:  No headache, visual changes, nausea,  vomiting, diarrhea, constipation, dizziness, abdominal pain, skin rash, fevers, chills, night sweats, weight loss, swollen lymph nodes, body aches, joint swelling, chest pain, shortness of breath, mood changes. POSITIVE muscle aches  Objective  There were no vitals taken for this visit.   General: No apparent distress alert and oriented x3 mood and affect normal, dressed appropriately.  HEENT: Pupils equal, extraocular movements intact  Respiratory: Patient's speak in full sentences and does not appear short of breath  Cardiovascular: No lower extremity edema, non tender, no erythema  Gait MSK:  Back mild loss of lordosis.  Still tightness noted in the parascapular area right greater than left.  Some scapular dyskinesis noted.  Significant tightness with sidebending of the neck bilaterally.  Osteopathic findings  C2 flexed rotated and side bent right C6 flexed rotated and side bent left C7 flexed rotated and side bent right T3 extended rotated and side bent right inhaled rib T6 extended rotated and side bent left with inhaled rib T9 extended rotated and side bent left L2 flexed rotated and side bent right Sacrum right on right     Assessment and Plan:  No problem-specific Assessment & Plan notes found for this encounter.    Nonallopathic problems  Decision today to treat with OMT was based on Physical  Exam  After verbal consent patient was treated with HVLA, ME, FPR techniques in cervical, rib, thoracic, lumbar, and sacral  areas  Patient tolerated the procedure well with improvement in symptoms  Patient given exercises, stretches and lifestyle modifications  See medications in patient instructions if given  Patient will follow up in 4-8 weeks    The above documentation has been reviewed and is accurate and complete Kristy Shaw M Kristy Kamiya, DO          Note: This dictation was prepared with Dragon dictation along with smaller phrase technology. Any transcriptional errors  that result from this process are unintentional.

## 2024-04-25 ENCOUNTER — Ambulatory Visit (INDEPENDENT_AMBULATORY_CARE_PROVIDER_SITE_OTHER)

## 2024-04-25 ENCOUNTER — Ambulatory Visit: Admitting: Family Medicine

## 2024-04-25 VITALS — BP 112/70 | HR 78 | Ht 69.0 in | Wt 161.0 lb

## 2024-04-25 DIAGNOSIS — M9902 Segmental and somatic dysfunction of thoracic region: Secondary | ICD-10-CM | POA: Diagnosis not present

## 2024-04-25 DIAGNOSIS — M9908 Segmental and somatic dysfunction of rib cage: Secondary | ICD-10-CM | POA: Diagnosis not present

## 2024-04-25 DIAGNOSIS — F17200 Nicotine dependence, unspecified, uncomplicated: Secondary | ICD-10-CM | POA: Diagnosis not present

## 2024-04-25 DIAGNOSIS — M7711 Lateral epicondylitis, right elbow: Secondary | ICD-10-CM

## 2024-04-25 DIAGNOSIS — G2589 Other specified extrapyramidal and movement disorders: Secondary | ICD-10-CM

## 2024-04-25 DIAGNOSIS — M9903 Segmental and somatic dysfunction of lumbar region: Secondary | ICD-10-CM

## 2024-04-25 DIAGNOSIS — M9901 Segmental and somatic dysfunction of cervical region: Secondary | ICD-10-CM | POA: Diagnosis not present

## 2024-04-25 DIAGNOSIS — M9904 Segmental and somatic dysfunction of sacral region: Secondary | ICD-10-CM

## 2024-04-25 NOTE — Patient Instructions (Signed)
 Xray today Wear brace less and less during the day No overhand lifting See you again in 2 months

## 2024-04-26 ENCOUNTER — Encounter: Payer: Self-pay | Admitting: Family Medicine

## 2024-04-26 NOTE — Assessment & Plan Note (Signed)
 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  Patient has had more acute rib pain that seems to be on the left side we discussed with patient and we will get an x-ray.  Patient is still a smoker as well and did have a fairly reasonable cough today.

## 2024-04-26 NOTE — Assessment & Plan Note (Signed)
 Improvement noted but still some discomfort.  Discussed icing regimen and home exercises.  Discussed which activities to do and which ones to avoid.  Worsening pain will need to consider injection.  Patient wants to continue with conservative therapy at this time

## 2024-04-27 ENCOUNTER — Encounter: Payer: Self-pay | Admitting: Family Medicine

## 2024-04-30 ENCOUNTER — Ambulatory Visit

## 2024-04-30 DIAGNOSIS — D122 Benign neoplasm of ascending colon: Secondary | ICD-10-CM

## 2024-04-30 DIAGNOSIS — K2970 Gastritis, unspecified, without bleeding: Secondary | ICD-10-CM | POA: Diagnosis not present

## 2024-04-30 DIAGNOSIS — K640 First degree hemorrhoids: Secondary | ICD-10-CM | POA: Diagnosis not present

## 2024-04-30 DIAGNOSIS — K2289 Other specified disease of esophagus: Secondary | ICD-10-CM

## 2024-04-30 DIAGNOSIS — K222 Esophageal obstruction: Secondary | ICD-10-CM | POA: Diagnosis not present

## 2024-04-30 DIAGNOSIS — R1310 Dysphagia, unspecified: Secondary | ICD-10-CM | POA: Diagnosis not present

## 2024-04-30 DIAGNOSIS — Z1211 Encounter for screening for malignant neoplasm of colon: Secondary | ICD-10-CM

## 2024-04-30 DIAGNOSIS — R1314 Dysphagia, pharyngoesophageal phase: Secondary | ICD-10-CM | POA: Diagnosis not present

## 2024-05-03 ENCOUNTER — Ambulatory Visit: Admitting: Sports Medicine

## 2024-05-03 VITALS — BP 118/70 | HR 91 | Ht 69.0 in | Wt 160.4 lb

## 2024-05-03 DIAGNOSIS — M9902 Segmental and somatic dysfunction of thoracic region: Secondary | ICD-10-CM

## 2024-05-03 DIAGNOSIS — M9907 Segmental and somatic dysfunction of upper extremity: Secondary | ICD-10-CM

## 2024-05-03 DIAGNOSIS — G2589 Other specified extrapyramidal and movement disorders: Secondary | ICD-10-CM

## 2024-05-03 DIAGNOSIS — M546 Pain in thoracic spine: Secondary | ICD-10-CM

## 2024-05-03 DIAGNOSIS — G8929 Other chronic pain: Secondary | ICD-10-CM

## 2024-05-03 DIAGNOSIS — R0781 Pleurodynia: Secondary | ICD-10-CM

## 2024-05-03 DIAGNOSIS — M9908 Segmental and somatic dysfunction of rib cage: Secondary | ICD-10-CM

## 2024-05-03 MED ORDER — MELOXICAM 15 MG PO TABS
15.0000 mg | ORAL_TABLET | Freq: Every day | ORAL | 0 refills | Status: AC
Start: 2024-05-03 — End: ?

## 2024-05-03 NOTE — Patient Instructions (Signed)
-   Start meloxicam 15 mg daily x2 weeks.  If still having pain after 2 weeks, complete 3rd-week of NSAID. May use remaining NSAID as needed once daily for pain control.  Do not to use additional over-the-counter NSAIDs (ibuprofen, naproxen, Advil, Aleve, etc.) while taking prescription NSAIDs.  May use Tylenol  [phone] mg 2 to 3 times a day for breakthrough pain.  Scapular HEP. Follow up already scheduled with Dr. Felipe Horton. Keep that appointment. If needed before that appointment, you can schedule a follow up with us .

## 2024-05-03 NOTE — Progress Notes (Signed)
 Ben Molley Houser D.[facility] [address] [place] [address] Phone: [phone]   Assessment and Plan:     1. Scapular dyskinesis 2. Chronic left-sided thoracic back pain 3. Rib pain on left side 4. Somatic dysfunction of thoracic region 5. Somatic dysfunction of rib region 6. Somatic dysfunction of upper extremities -Chronic with exacerbation, subsequent visit - Recurrence of left-sided rib pain radiating along left rib cage, likely displaced from coughing versus physical activity.  Expect exacerbation with underlying left-sided scapular dyskinesis - Patient received significant relief in similar symptoms with OMT at previous visit and elected for repeat OMT today. Tolerated well per note below. - Start meloxicam 15 mg daily x2 weeks.  If still having pain after 2 weeks, complete 3rd-week of NSAID. May use remaining NSAID as needed once daily for pain control.  Do not to use additional over-the-counter NSAIDs (ibuprofen, naproxen, Advil, Aleve, etc.) while taking prescription NSAIDs.  May use Tylenol  [phone] mg 2 to 3 times a day for breakthrough pain. - Start HEP for scapular dyskinesis - Decision today to treat with OMT was based on Physical Exam   After verbal consent patient was treated with HVLA (high velocity low amplitude), ME (muscle energy), FPR (flex positional release), ST (soft tissue), PC/PD (Pelvic Compression/ Pelvic Decompression) techniques in upper extremity, rib, thoracic,   areas. Patient tolerated the procedure well with improvement in symptoms.  Patient educated on potential side effects of soreness and recommended to rest, hydrate, and use Tylenol  as needed for pain control.   15 additional minutes spent for educating Therapeutic Home Exercise Program.  This included exercises focusing on stretching, strengthening, with focus on eccentric aspects.   Long term goals include an improvement in range of motion, strength, endurance as well as  avoiding reinjury. Patient's frequency would include in 1-2 times a day, 3-5 times a week for a duration of 6-12 weeks. Proper technique shown and discussed handout in great detail with ATC.  All questions were discussed and answered.    Pertinent previous records reviewed include prior sports medicine notes  Follow Up: Patient has follow-up with Dr. Felipe Horton scheduled in 6 to 8 weeks.  May follow-up with me sooner if pain persists or patient would like repeat OMT   Subjective:   I, Leone Ralphs am a scribe for Dr. Cleora Daft.    Chief Complaint: MSK   HPI:  04/25/2024 Taurean Gad Weakland is a 48 y.o. female coming in with complaint of back and neck pain. OMT on 02/29/2024. Also seen for elbow pain. Patient states hurt herself Monday coughing. L rib pain. Deep breathe and stretching hurts. Elbow pain is better. When she stopped wearing brace to bed elbow feels like its locked in the AM.   05/03/24 Patient states back and neck are ok. Saw Dr. Felipe Horton recently about ribs. It felt good the next day but the rest of that week the pain got worse. Taking tylenol  and ibuprofen like candy. Wants her ribs to be rechecked to make sure she did not do something to it.   Relevant Historical Information: OSA  Additional pertinent review of systems negative.  Current Outpatient Medications  Medication Sig Dispense Refill   albuterol  (VENTOLIN  HFA) 108 (90 Base) MCG/ACT inhaler INHALE ONE TO TWO PUFFS BY MOUTH INTO THE LUNGS EVERY 6 HOURS  AS NEEDED FOR WHEEZING OR SHORTNESS OF BREATH 8.5 g 0   ARIPiprazole  (ABILIFY ) 2 MG tablet Take 1 tablet (2 mg total) by mouth daily. 90 tablet  1   ASMANEX  HFA 100 MCG/ACT AERO Inhale 2 puffs into the lungs 2 (two) times daily. 13 g 3   cetirizine (ZYRTEC) 10 MG tablet Take by mouth.     clobetasol  ointment (TEMOVATE ) 0.05 % Apply 1 application topically 2 (two) times daily. 30 g 1   dupilumab  (DUPIXENT ) 300 MG/2ML prefilled syringe INJECT 1 SYRINGE (300MG ) SUBCUTANEOUSLY EVERY  14 DAYS. 4 mL 5   fluticasone  (FLONASE ) 50 MCG/ACT nasal spray Place 1 spray into both nostrils daily. 15.8 mL 2   gabapentin  (NEURONTIN ) 300 MG capsule Take 1 capsule (300 mg total) by mouth 4 (four) times daily. 360 capsule 0   hydrOXYzine  (VISTARIL ) 25 MG capsule TAKE 1 CAPSULE (25 MG TOTAL) BY MOUTH DAILY AS NEEDED. FOR SEVERE ANXIETY SYMPTOMS 90 capsule 1   meloxicam (MOBIC) 15 MG tablet Take 1 tablet (15 mg total) by mouth daily. 30 tablet 0   montelukast (SINGULAIR) 10 MG tablet Take 10 mg by mouth daily.     nicotine  (NICODERM CQ  - DOSED IN MG/24 HOURS) 14 mg/24hr patch Place 1 patch (14 mg total) onto the skin daily. 28 patch 2   nicotine  polacrilex (NICORETTE ) 2 MG gum Take 1 each (2 mg total) by mouth every 4 (four) hours while awake. 100 tablet 0   solifenacin (VESICARE) 5 MG tablet Take by mouth.     tacrolimus  (PROTOPIC ) 0.1 % ointment Apply topically 2 (two) times daily. 100 g 2   tamoxifen  (NOLVADEX ) 10 MG tablet Take 0.5 tablets (5 mg total) by mouth daily. 90 tablet 1   TRELEGY ELLIPTA 100-62.5-25 MCG/ACT AEPB Inhale 1 puff into the lungs daily.     venlafaxine  XR (EFFEXOR -XR) 150 MG 24 hr capsule TAKE 1 CAPSULE BY MOUTH DAILY WITH BREAKFAST , ALONG WITH 75MG  CAPSULE 90 capsule 0   venlafaxine  XR (EFFEXOR -XR) 75 MG 24 hr capsule Take 1 capsule by mouth daily with breakfast  along with 150 MG daily 90 capsule 1   Vitamin D , Ergocalciferol , (DRISDOL ) 1.25 MG (50000 UNIT) CAPS capsule Take 1 capsule (50,000 Units total) by mouth every 7 (seven) days. 12 capsule 0   No current facility-administered medications for this visit.      Objective:     Vitals:   05/03/24 1323  BP: 118/70  Pulse: 91  SpO2: 96%  Weight: 160 lb 6.4 oz (72.8 kg)  Height: 5\' 9"  (1.753 m)      Body mass index is 23.69 kg/m.    Physical Exam:     General: Well-appearing, cooperative, sitting comfortably in no acute distress.   OMT Physical Exam:   Rib: Ribs 4-6 stuck in inhalation on  left Thoracic: TTP paraspinal, T4 RLSL, T6 RLSL Upper extremity: Left scapular winging and delayed motion with shoulder ROM  Electronically signed by:  Marshall Skeeter D.[facility] 2:22 PM 05/03/24

## 2024-05-10 ENCOUNTER — Telehealth (INDEPENDENT_AMBULATORY_CARE_PROVIDER_SITE_OTHER): Admitting: Psychiatry

## 2024-05-10 ENCOUNTER — Encounter: Payer: Self-pay | Admitting: Psychiatry

## 2024-05-10 DIAGNOSIS — F3342 Major depressive disorder, recurrent, in full remission: Secondary | ICD-10-CM

## 2024-05-10 DIAGNOSIS — F418 Other specified anxiety disorders: Secondary | ICD-10-CM

## 2024-05-10 DIAGNOSIS — G479 Sleep disorder, unspecified: Secondary | ICD-10-CM | POA: Diagnosis not present

## 2024-05-10 DIAGNOSIS — F17210 Nicotine dependence, cigarettes, uncomplicated: Secondary | ICD-10-CM

## 2024-05-10 DIAGNOSIS — F17200 Nicotine dependence, unspecified, uncomplicated: Secondary | ICD-10-CM

## 2024-05-10 DIAGNOSIS — F1590 Other stimulant use, unspecified, uncomplicated: Secondary | ICD-10-CM

## 2024-05-10 MED ORDER — NICOTINE 7 MG/24HR TD PT24: 7.0000 mg | MEDICATED_PATCH | Freq: Every day | TRANSDERMAL | 1 refills | Status: AC
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
## 2024-05-11 ENCOUNTER — Encounter: Payer: Self-pay | Admitting: Psychiatry

## 2024-05-14 ENCOUNTER — Encounter: Payer: Self-pay | Admitting: Cardiology

## 2024-05-15 ENCOUNTER — Telehealth: Admitting: Family Medicine

## 2024-05-15 DIAGNOSIS — M7989 Other specified soft tissue disorders: Secondary | ICD-10-CM

## 2024-05-15 NOTE — Progress Notes (Signed)
 [place]   Needs to be seen in person for new onset and slightlyworsening leg swelling. Has appt with PCP and labs coming up. Advised to call back about it and see if they can work her in sooner.   No chest or shortness of breath and or palpitations noted.  Patient acknowledged agreement and understanding of the plan.

## 2024-05-17 ENCOUNTER — Other Ambulatory Visit: Payer: Self-pay | Admitting: Cardiology

## 2024-05-17 MED ORDER — FUROSEMIDE 20 MG PO TABS: 20.0000 mg | ORAL_TABLET | Freq: Every day | ORAL | 0 refills | Status: DC

## 2024-05-22 ENCOUNTER — Ambulatory Visit (INDEPENDENT_AMBULATORY_CARE_PROVIDER_SITE_OTHER): Admitting: Professional Counselor

## 2024-05-22 DIAGNOSIS — F3341 Major depressive disorder, recurrent, in partial remission: Secondary | ICD-10-CM

## 2024-05-22 DIAGNOSIS — F418 Other specified anxiety disorders: Secondary | ICD-10-CM

## 2024-05-22 NOTE — Progress Notes (Signed)
 THERAPIST PROGRESS NOTE  Virtual Visit via Video Note  I connected with Kristy Shaw on 05/22/24 at  3:00 PM EDT by a video enabled telemedicine application and verified that I am speaking with the correct person using two identifiers.  Location: Patient: Home Provider: Remote Office   I discussed the limitations of evaluation and management by telemedicine and the availability of in person appointments. The patient expressed understanding and agreed to proceed.   I discussed the assessment and treatment plan with the patient. The patient was provided an opportunity to ask questions and all were answered. The patient agreed with the plan and demonstrated an understanding of the instructions.   The patient was advised to call back or seek an in-person evaluation if the symptoms worsen or if the condition fails to improve as anticipated.  I provided 48 minutes of non-face-to-face time during this encounter. Len Quale, [facility]  Session Time: 3:04 PM - 3:52 PM  Participation Level: Active  Behavioral Response: Casual, Alert, Anxious and Dysphoric  Type of Therapy: Individual Therapy  Treatment Goals addressed: Active OP Depression  LTG: "Feeling more comfortable, less anxious with things of the unknown. I'm very methodical. I will think of a scenario and play it out every which way to try and figure out how I would react to it."                Start:  02/27/24    Expected End:  02/25/25      STG: "I've never really had a close family member pass. It's really hard for me to display certain emotions." To improve emotional intelligence AEB identifying and processing emotions over the next 12 weeks.     STG: "I need to schedule my sleep study." To improve wellness AEB attending health appointments, engaging in healthy activities and self-care over the next 12 weeks.     STG: "I want to quit smoking." To achieve smoking cessation AEB self-report of using positive replacement  activities over the next 12 weeks.    ProgressTowards Goals: Progressing  Interventions: CBT, Motivational Interviewing, Assertiveness Training, and Supportive  Summary: Kristy Shaw is a 48 y.o. female who presents with a history of anxiety and depression. She appeared alert and oriented x5. She stated a lot has been going on since her last session. She reported her sister attempted suicide by overdose. Her sister was hospitalized and is doing aftercare. Tiona reported her father was given a time frame for the progression of his cancer. This has been a hard adjustment. Dafney struggles communicating with her mother. She was receptive to trying to use more validation or DEAR MAN skill. Cree discussed her attempts at smoking cessation. She identified obstacles and was receptive to trying urge surfing in addition to the patches she was prescribed.    Therapist Response: Conducted session with Kristy Shaw. Began session with check-in/update since previous session. Utilized empathetic and reflective listening. Used open-ended questions to facilitiate discussion and summarized Kristy Shaw's thoughts/feelings. Reviewed DEAR MAN skill for assertiveness and discussed using validation instead of providing solutions to other people's problems/concerns. Explained urge surfing to assist with smoking cessation. Scheduled additional appointment and concluded session.   Suicidal/Homicidal: No  Plan: Return again in 2 weeks.  Diagnosis: Other specified anxiety disorders  MDD (major depressive disorder), recurrent, in partial remission (HCC)  Collaboration of Care: Medication Management AEB chart review  Patient/Guardian was advised Release of Information must be obtained prior to any record release in order to collaborate their  care with an outside provider. Patient/Guardian was advised if they have not already done so to contact the registration department to sign all necessary forms in order for us  to release  information regarding their care.   Consent: Patient/Guardian gives verbal consent for treatment and assignment of benefits for services provided during this visit. Patient/Guardian expressed understanding and agreed to proceed.   Len Quale, [facility] 05/22/2024

## 2024-05-30 ENCOUNTER — Ambulatory Visit
Admission: RE | Admit: 2024-05-30 | Discharge: 2024-05-30 | Disposition: A | Discharge status: Home / Self Care | Source: Ambulatory Visit | Attending: Emergency Medicine | Admitting: Emergency Medicine

## 2024-05-30 VITALS — BP 131/79 | HR 78 | Temp 98.1°F | Resp 16

## 2024-05-30 DIAGNOSIS — L03113 Cellulitis of right upper limb: Secondary | ICD-10-CM

## 2024-05-30 MED ORDER — PREDNISONE 10 MG (21) PO TBPK: ORAL_TABLET | Freq: Every day | ORAL | 0 refills | Status: DC

## 2024-05-30 MED ORDER — CEPHALEXIN 500 MG PO CAPS: 500.0000 mg | ORAL_CAPSULE | Freq: Four times a day (QID) | ORAL | 0 refills | Status: DC

## 2024-05-30 MED ORDER — FLUCONAZOLE 150 MG PO TABS: 150.0000 mg | ORAL_TABLET | ORAL | 0 refills | Status: DC | PRN

## 2024-05-30 NOTE — Discharge Instructions (Signed)
 Today you are evaluated for the welling in heat to the skin which is concerning for infection  Take cephalexin every 6 hours as needed for 5 days  Begin prednisone  every morning for 6 days to help reduce inflammation and help with pain  May apply any topical medicine such as lidocaine  diclofenac to help with pain and swelling, for itching May use Benadryl cream, calamine lotion and oatmeal baths  If symptoms continue to persist and you have concerns regarding delayed healing please follow-up for reevaluation

## 2024-05-30 NOTE — ED Provider Notes (Signed)
 [name]    CSN: [id number] Arrival date & time: 05/30/24  1449      History   Chief Complaint Chief Complaint  Patient presents with   Allergic Reaction    I have been bitten or stung by something several days ago. I have redness, swelling,itching,pain,and a lump on my lower arm. - Entered by patient    HPI Kristy Shaw is a 48 y.o. female.   Patient presents for evaluation of redness and swelling present to the right forearm beginning 2-3 day ago.  Possible insect bite was unwitnessed.  Has increased in size and become tender and hot to touch.  Has been applying mupirocin ointment, clean with alcohol ,without improvement.  Denies drainage or fever.  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[facility])    Depression    Dyspnea    Dysrhythmia    IRREGULAR HEART BEAT   Family history of adverse reaction to anesthesia    DAD-STOKE COMING OUT OF ANESTHESIA    Family history of breast cancer    Fatigue    Fibromyalgia    GERD (gastroesophageal reflux disease)    OCC   IBS (irritable bowel syndrome)    Mitral valve prolapse    Restless leg syndrome    Sleep apnea    HAS CPAP MACHINE BUT DOES NOT USE ON A REGULAR BASIS    Patient Active Problem List   Diagnosis Date Noted   Lateral epicondylitis, right elbow 02/29/2024   Yeast infection 11/07/2023   Upper respiratory tract infection 09/05/2023   Allergic rhinitis 09/05/2023   Acute non-recurrent maxillary sinusitis 09/05/2023   Degenerative disc disease, cervical 08/16/2023   Scapular dyskinesis 05/17/2023   Somatic dysfunction of spine, thoracic 05/17/2023   High risk medication use 09/21/2022   Sleep disorder 11/17/2021   Genetic testing 08/28/2021   Family history of breast cancer 08/28/2021   Bereavement 07/30/2021   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   Hypersomnia with sleep apnea 05/26/2021   MDD (major depressive disorder), recurrent episode, mild (HCC) 04/08/2021   Noncompliance with treatment plan 02/10/2021   MDD (major depressive disorder), recurrent, in full remission (HCC) 03/27/2020   MDD (major depressive disorder), recurrent, in partial remission (HCC) 09/26/2019   MDD (major depressive disorder), recurrent episode, moderate (HCC) 06/15/2019   Insomnia due to mental condition 06/15/2019   Tobacco use disorder 06/15/2019   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   OSA on CPAP 09/07/2018   Amenorrhea 07/25/2018   Fibromyalgia 10/12/2017   History of ovarian cyst 03/07/2017   Vaginal bleeding in pregnancy 03/07/2017   Radial scar of breast 07/28/2016   Anxiety disorder 06/10/2014   Airway hyperreactivity 06/10/2014   Clinical depression 06/10/2014   Gastroduodenal ulcer 06/10/2014   Reflux 06/10/2014    Past Surgical History:  Procedure Laterality Date   BREAST BIOPSY Left 03/18/2016   Radial scar   BREAST BIOPSY Right 04/07/2021   stereo bx of distortion, x marker, path pending   BREAST EXCISIONAL BIOPSY Right 05/2021   BREAST LUMPECTOMY WITH RADIOACTIVE SEED LOCALIZATION Left 06/07/2016   Procedure: LEFT BREAST LUMPECTOMY WITH RADIOACTIVE SEED LOCALIZATION;  Surgeon: Lillette Reid III, MD;  Location: [facility];  Service: General;  Laterality: Left;   BREAST LUMPECTOMY WITH RADIOACTIVE SEED LOCALIZATION Right 06/10/2021   Procedure: RIGHT BREAST LUMPECTOMY WITH RADIOACTIVE SEED LOCALIZATION;  Surgeon: Caralyn Chandler, MD;  Location: [facility] OR;  Service: General;  Laterality: Right;   DILATION AND CURETTAGE OF UTERUS     DILATION AND EVACUATION N/A 08/08/2018   Procedure: DILATATION AND EVACUATION;  Surgeon: Prescilla Brod, MD;  Location: [facility] ORS;  Service: Gynecology;  Laterality: N/A;   HEMORRHOID SURGERY N/A 03/24/2017   Procedure: EXCISION ANORECTAL POLYP;  Surgeon:  Jerlean Mood, MD;  Location: [facility] ORS;  Service: General;  Laterality: N/A;   NASAL SEPTUM SURGERY      OB History     Gravida  5   Para  1   Term      Preterm      AB      Living         SAB      IAB      Ectopic      Multiple      Live Births           Obstetric Comments  1st Menstrual Cycle:  11 1st Pregnancy: 35            Home Medications    Prior to Admission medications   Medication Sig Start Date End Date Taking? Authorizing Provider  cephALEXin (KEFLEX) 500 MG capsule Take 1 capsule (500 mg total) by mouth 4 (four) times daily. 05/30/24  Yes Haley Roza R, NP  fluconazole  (DIFLUCAN ) 150 MG tablet Take 1 tablet (150 mg total) by mouth every three (3) days as needed for up to 2 doses. 05/30/24  Yes Shayma Pfefferle R, NP  predniSONE  (STERAPRED UNI-PAK 21 TAB) 10 MG (21) TBPK tablet Take by mouth daily. Take 6 tabs by mouth daily  for 1 days, then 5 tabs for 1 days, then 4 tabs for 1 days, then 3 tabs for 1 days, 2 tabs for 1 days, then 1 tab by mouth daily for 1 days 05/30/24  Yes Romualdo Prosise R, NP  albuterol  (VENTOLIN  HFA) 108 (90 Base) MCG/ACT inhaler INHALE ONE TO TWO PUFFS BY MOUTH INTO THE LUNGS EVERY 6 HOURS  AS NEEDED FOR WHEEZING OR SHORTNESS OF BREATH 03/02/24   Scoggins, Hospital doctor, NP  ARIPiprazole  (ABILIFY ) 2 MG tablet Take 1 tablet (2 mg total) by mouth daily. 04/16/24   Eappen, Saramma, MD  ASMANEX  HFA 100 MCG/ACT AERO Inhale 2 puffs into the lungs 2 (two) times daily. 09/05/23   Scoggins, Amber, NP  cetirizine (ZYRTEC) 10 MG tablet Take by mouth.    [provider]  clobetasol  ointment (TEMOVATE ) 0.05 % Apply 1 application topically 2 (two) times daily. 07/21/21   Artemio Larry, MD  dupilumab  (DUPIXENT ) 300 MG/2ML prefilled syringe INJECT 1 SYRINGE (300MG ) SUBCUTANEOUSLY EVERY 14 DAYS. 04/23/24   Artemio Larry, MD  fluticasone  (FLONASE ) 50 MCG/ACT nasal spray Place 1 spray into both nostrils daily. 09/05/23   Scoggins, Amber, NP   furosemide  (LASIX ) 20 MG tablet Take 1 tablet (20 mg total) by mouth daily for 3 days. 05/17/24 05/20/24  Scoggins, Amber, NP  gabapentin  (NEURONTIN ) 300 MG capsule Take 1 capsule (300 mg total) by mouth 4 (four) times daily. 08/29/23   Eappen, Saramma, MD  hydrOXYzine  (VISTARIL ) 25 MG capsule TAKE 1 CAPSULE (25 MG TOTAL) BY MOUTH DAILY AS NEEDED. FOR SEVERE ANXIETY SYMPTOMS 01/01/22   Eappen, Saramma, MD  meloxicam  (MOBIC ) 15 MG tablet Take 1 tablet (15 mg total)  by mouth daily. 05/03/24   Ulysees Gander, DO  montelukast (SINGULAIR) 10 MG tablet Take 10 mg by mouth daily.    [provider]  nicotine  (NICODERM CQ  - DOSED IN MG/24 HR) 7 mg/24hr patch Place 1 patch (7 mg total) onto the skin daily. 05/10/24   Eappen, Saramma, MD  nicotine  polacrilex (NICORETTE ) 2 MG gum Take 1 each (2 mg total) by mouth every 4 (four) hours while awake. 10/27/23   Eappen, Saramma, MD  solifenacin (VESICARE) 5 MG tablet Take by mouth. 08/12/22   [provider]  tacrolimus  (PROTOPIC ) 0.1 % ointment Apply topically 2 (two) times daily. 10/25/23   Artemio Larry, MD  tamoxifen  (NOLVADEX ) 10 MG tablet Take 0.5 tablets (5 mg total) by mouth daily. 10/31/23   Cameron Cea, MD  TRELEGY ELLIPTA 100-62.5-25 MCG/ACT AEPB Inhale 1 puff into the lungs daily. 04/04/24   [provider]  venlafaxine  XR (EFFEXOR -XR) 150 MG 24 hr capsule TAKE 1 CAPSULE BY MOUTH DAILY WITH BREAKFAST , ALONG WITH 75MG  CAPSULE 04/10/24   Alysia Bachelor, MD  venlafaxine  XR (EFFEXOR -XR) 75 MG 24 hr capsule Take 1 capsule by mouth daily with breakfast  along with 150 MG daily 04/16/24   Eappen, Saramma, MD  Vitamin D , Ergocalciferol , (DRISDOL ) 1.25 MG (50000 UNIT) CAPS capsule Take 1 capsule (50,000 Units total) by mouth every 7 (seven) days. 10/19/23   Isidro Margo, DO    Family History Family History  Problem Relation Age of Onset   Hypertension Mother    Stroke Mother    Heart attack Father    Stroke Father    Depression  Father    Depression Sister    Kidney disease Brother    ADD / ADHD Brother    Asthma Brother    Breast cancer Maternal Grandmother 67    Social History Social History   Tobacco Use   Smoking status: Every Day    Current packs/day: 2.00    Average packs/day: 2.0 packs/day for 33.7 years (67.4 ttl pk-yrs)    Types: Cigarettes    Start date: 09/23/1990   Smokeless tobacco: Never   Tobacco comments:    Patient states she has been smoking more recently, trying to distract herself from other things  Vaping Use   Vaping status: Former   Quit date: 07/25/2018  Substance Use Topics   Alcohol use: No    Alcohol/week: 0.0 standard drinks of alcohol   Drug use: Yes    Frequency: 7.0 times per week    Types: Marijuana    Comment: daily     Allergies   Gold-containing drug products, Adhesive [tape], and Doxycycline    Review of Systems Review of Systems   Physical Exam Triage Vital Signs ED Triage Vitals [05/30/24 1501]  Encounter Vitals Group     BP 131/79     Systolic BP Percentile      Diastolic BP Percentile      Pulse Rate 78     Resp 16     Temp 98.1 F (36.7 C)     Temp Source Oral     SpO2 97 %     Weight      Height      Head Circumference      Peak Flow      Pain Score      Pain Loc      Pain Education      Exclude from Growth Chart    No data found.  Updated  Vital Signs BP 131/79 (BP Location: Left Arm)   Pulse 78   Temp 98.1 F (36.7 C) (Oral)   Resp 16   SpO2 97%   Visual Acuity Right Eye Distance:   Left Eye Distance:   Bilateral Distance:    Right Eye Near:   Left Eye Near:    Bilateral Near:     Physical Exam Constitutional:      Appearance: Normal appearance.  HENT:     Head: Normocephalic.  Eyes:     Extraocular Movements: Extraocular movements intact.  Pulmonary:     Effort: Pulmonary effort is normal.  Skin:    Comments: Less than 0.5 cm present to the posterior of the right forearm, surrounding erythema, swelling  tenderness, no drainage noted, 2+ brachial radial pulse  Neurological:     Mental Status: She is alert and oriented to person, place, and time. Mental status is at baseline.      UC Treatments / Results  Labs (all labs ordered are listed, but only abnormal results are displayed) Labs Reviewed - No data to display  EKG   Radiology No results found.  Procedures Procedures (including critical care time)  Medications Ordered in UC Medications - No data to display  Initial Impression / Assessment and Plan / UC Course  I have reviewed the triage vital signs and the nursing notes.  Pertinent labs & imaging results that were available during my care of the patient were reviewed by me and considered in my medical decision making (see chart for details).    Cellulitis of right forearm  Presentation concerning for infection will provide bacterial coverage, cephalexin prescribed as well as prednisone  to help reduce swelling and pain, advise follow-up for any persisting symptoms and recommended supportive care Final Clinical Impressions(s) / UC Diagnoses   Final diagnoses:  Cellulitis of right forearm   Discharge Instructions      Today you are evaluated for the welling in heat to the skin which is concerning for infection  Take cephalexin every 6 hours as needed for 5 days  Begin prednisone  every morning for 6 days to help reduce inflammation and help with pain  May apply any topical medicine such as lidocaine  diclofenac to help with pain and swelling, for itching May use Benadryl cream, calamine lotion and oatmeal baths  If symptoms continue to persist and you have concerns regarding delayed healing please follow-up for reevaluation  ED Prescriptions     Medication Sig Dispense Auth. Provider   cephALEXin (KEFLEX) 500 MG capsule Take 1 capsule (500 mg total) by mouth 4 (four) times daily. 20 capsule Trisha Morandi R, NP   predniSONE  (STERAPRED UNI-PAK 21 TAB) 10 MG (21)  TBPK tablet Take by mouth daily. Take 6 tabs by mouth daily  for 1 days, then 5 tabs for 1 days, then 4 tabs for 1 days, then 3 tabs for 1 days, 2 tabs for 1 days, then 1 tab by mouth daily for 1 days 21 tablet Ladrea Holladay R, NP   fluconazole  (DIFLUCAN ) 150 MG tablet Take 1 tablet (150 mg total) by mouth every three (3) days as needed for up to 2 doses. 2 tablet Tarvaris Puglia R, NP      PDMP not reviewed this encounter.   Reena Canning, NP 05/30/24 1620

## 2024-05-30 NOTE — ED Triage Notes (Signed)
 Patient presents to [facility] for insect bite to right forearm since Saturday. Concerned with increased redness, warmth. Has applied mupirocin.

## 2024-06-04 ENCOUNTER — Other Ambulatory Visit

## 2024-06-04 ENCOUNTER — Ambulatory Visit (INDEPENDENT_AMBULATORY_CARE_PROVIDER_SITE_OTHER): Admitting: Professional Counselor

## 2024-06-04 DIAGNOSIS — I10 Essential (primary) hypertension: Secondary | ICD-10-CM

## 2024-06-04 DIAGNOSIS — F3341 Major depressive disorder, recurrent, in partial remission: Secondary | ICD-10-CM

## 2024-06-04 DIAGNOSIS — Z1329 Encounter for screening for other suspected endocrine disorder: Secondary | ICD-10-CM

## 2024-06-04 DIAGNOSIS — F418 Other specified anxiety disorders: Secondary | ICD-10-CM

## 2024-06-04 DIAGNOSIS — Z13220 Encounter for screening for lipoid disorders: Secondary | ICD-10-CM

## 2024-06-04 DIAGNOSIS — Z131 Encounter for screening for diabetes mellitus: Secondary | ICD-10-CM

## 2024-06-04 NOTE — Progress Notes (Signed)
  THERAPIST PROGRESS NOTE  Session Time: 3:56 PM - 4:50 PM  Participation Level: Active  Behavioral Response: Casual, Alert, Anxious  Type of Therapy: Individual Therapy  Treatment Goals addressed: Active OP Depression  LTG: Feeling more comfortable, less anxious with things of the unknown. I'm very methodical. I will think of a scenario and play it out every which way to try and figure out how I would react to it.                Start:  02/27/24    Expected End:  02/25/25      STG: I've never really had a close family member pass. It's really hard for me to display certain emotions. To improve emotional intelligence AEB identifying and processing emotions over the next 12 weeks.     STG: I need to schedule my sleep study. To improve wellness AEB attending health appointments, engaging in healthy activities and self-care over the next 12 weeks.     STG: I want to quit smoking. To achieve smoking cessation AEB self-report of using positive replacement activities over the next 12 weeks.  ProgressTowards Goals: Progressing  Interventions: CBT, Motivational Interviewing, and Supportive  Summary: Kristy Shaw is a 48 y.o. female who presents with a history of anxiety and depression. She appeared alert and oriented x5. She stated she has been flustered and overwhelmed since last session. [name] engaged in discussion and her stressors. She shared thoughts/feelings driving behavior that she's hoping to change. She was receptive to coping skills and tips to help. She noted a bathroom is one place she can start, followed by a desk. [name] will review DEAR MAN again and use handout to practice ways to respond assertively in various scenarios.  Therapist Response: Conducted session with [name]. Began session with check-in/update since previous session. Utilized empathetic and reflective listening. Explored [name]'s thoughts/feelings with current family situation and problematic behavior.  Encouraged [name] to identify a place to start making changes for herself. Modeled DEAR MAN and provided handout to practice writing assertive responses to various scenarios. Scheduled additional appointment and concluded session.   Suicidal/Homicidal: No  Plan: Return again in 2 weeks.  Diagnosis: Other specified anxiety disorders  MDD (major depressive disorder), recurrent, in partial remission (HCC)  Collaboration of Care: Medication Management AEB chart review  Patient/Guardian was advised Release of Information must be obtained prior to any record release in order to collaborate their care with an outside provider. Patient/Guardian was advised if they have not already done so to contact the registration department to sign all necessary forms in order for us  to release information regarding their care.   Consent: Patient/Guardian gives verbal consent for treatment and assignment of benefits for services provided during this visit. Patient/Guardian expressed understanding and agreed to proceed.   Len Quale, [facility] 06/04/2024

## 2024-06-05 ENCOUNTER — Ambulatory Visit: Payer: Self-pay | Admitting: Cardiology

## 2024-06-05 ENCOUNTER — Other Ambulatory Visit: Payer: Self-pay | Admitting: Cardiology

## 2024-06-05 DIAGNOSIS — E875 Hyperkalemia: Secondary | ICD-10-CM

## 2024-06-05 LAB — HEMOGLOBIN A1C
Est. average glucose Bld gHb Est-mCnc: 105 mg/dL
Hgb A1c MFr Bld: 5.3 % (ref 4.8–5.6)

## 2024-06-05 LAB — TSH: TSH: 1.78 u[IU]/mL (ref 0.450–4.500)

## 2024-06-05 LAB — CMP14+EGFR
ALT: 9 IU/L (ref 0–32)
AST: 10 IU/L (ref 0–40)
Albumin: 3.8 g/dL — ABNORMAL LOW (ref 3.9–4.9)
Alkaline Phosphatase: 73 IU/L (ref 44–121)
BUN/Creatinine Ratio: 7 — ABNORMAL LOW (ref 9–23)
BUN: 6 mg/dL (ref 6–24)
Bilirubin Total: <0.2 mg/dL (ref 0.0–1.2)
CO2: 27 mmol/L (ref 20–29)
Calcium: 8.8 mg/dL (ref 8.7–10.2)
Chloride: 102 mmol/L (ref 96–106)
Creatinine, Ser: 0.85 mg/dL (ref 0.57–1.00)
Globulin, Total: 2.5 g/dL (ref 1.5–4.5)
Glucose: 80 mg/dL (ref 70–99)
Potassium: 3.1 mmol/L — ABNORMAL LOW (ref 3.5–5.2)
Sodium: 142 mmol/L (ref 134–144)
Total Protein: 6.3 g/dL (ref 6.0–8.5)
eGFR: 84 mL/min/{1.73_m2} (ref >59)

## 2024-06-05 LAB — LIPID PANEL
Chol/HDL Ratio: 3.2 ratio (ref 0.0–4.4)
Cholesterol, Total: 153 mg/dL (ref 100–199)
HDL: 48 mg/dL (ref >39)
LDL Chol Calc (NIH): 82 mg/dL (ref 0–99)
Triglycerides: 131 mg/dL (ref 0–149)
VLDL Cholesterol Cal: 23 mg/dL (ref 5–40)

## 2024-06-12 ENCOUNTER — Ambulatory Visit: Payer: 59 | Admitting: Cardiology

## 2024-06-12 VITALS — BP 128/62 | HR 84 | Ht 69.0 in | Wt 158.0 lb

## 2024-06-12 DIAGNOSIS — E876 Hypokalemia: Secondary | ICD-10-CM | POA: Diagnosis not present

## 2024-06-12 DIAGNOSIS — Z0130 Encounter for examination of blood pressure without abnormal findings: Secondary | ICD-10-CM

## 2024-06-12 DIAGNOSIS — R002 Palpitations: Secondary | ICD-10-CM | POA: Diagnosis not present

## 2024-06-12 DIAGNOSIS — R609 Edema, unspecified: Secondary | ICD-10-CM | POA: Diagnosis not present

## 2024-06-12 DIAGNOSIS — I341 Nonrheumatic mitral (valve) prolapse: Secondary | ICD-10-CM
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
## 2024-06-13 ENCOUNTER — Ambulatory Visit: Payer: Self-pay | Admitting: Cardiology

## 2024-06-13 ENCOUNTER — Other Ambulatory Visit: Payer: Self-pay | Admitting: Cardiology

## 2024-06-13 ENCOUNTER — Encounter: Payer: Self-pay | Admitting: Cardiology

## 2024-06-13 DIAGNOSIS — E876 Hypokalemia: Secondary | ICD-10-CM | POA: Insufficient documentation

## 2024-06-13 DIAGNOSIS — R002 Palpitations: Secondary | ICD-10-CM | POA: Insufficient documentation

## 2024-06-13 LAB — POTASSIUM: Potassium: 3.2 mmol/L — ABNORMAL LOW (ref 3.5–5.2)

## 2024-06-13 MED ORDER — POTASSIUM CHLORIDE ER 10 MEQ PO TBCR: 10.0000 meq | EXTENDED_RELEASE_TABLET | Freq: Every day | ORAL | 4 refills | Status: DC

## 2024-06-19 ENCOUNTER — Ambulatory Visit: Admitting: Professional Counselor
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
## 2024-06-26 ENCOUNTER — Encounter: Payer: Self-pay | Admitting: Family Medicine

## 2024-06-26 ENCOUNTER — Ambulatory Visit (INDEPENDENT_AMBULATORY_CARE_PROVIDER_SITE_OTHER)

## 2024-06-26 ENCOUNTER — Other Ambulatory Visit: Payer: Self-pay

## 2024-06-26 ENCOUNTER — Other Ambulatory Visit

## 2024-06-26 ENCOUNTER — Ambulatory Visit: Admitting: Family Medicine

## 2024-06-26 VITALS — BP 104/74 | HR 71 | Ht 69.0 in | Wt 168.0 lb

## 2024-06-26 DIAGNOSIS — M9904 Segmental and somatic dysfunction of sacral region: Secondary | ICD-10-CM | POA: Diagnosis not present

## 2024-06-26 DIAGNOSIS — M9901 Segmental and somatic dysfunction of cervical region: Secondary | ICD-10-CM

## 2024-06-26 DIAGNOSIS — E876 Hypokalemia: Secondary | ICD-10-CM

## 2024-06-26 DIAGNOSIS — G8929 Other chronic pain: Secondary | ICD-10-CM

## 2024-06-26 DIAGNOSIS — M2550 Pain in unspecified joint: Secondary | ICD-10-CM | POA: Diagnosis not present

## 2024-06-26 DIAGNOSIS — M9903 Segmental and somatic dysfunction of lumbar region: Secondary | ICD-10-CM

## 2024-06-26 DIAGNOSIS — M9908 Segmental and somatic dysfunction of rib cage: Secondary | ICD-10-CM

## 2024-06-26 DIAGNOSIS — M7711 Lateral epicondylitis, right elbow: Secondary | ICD-10-CM | POA: Diagnosis not present

## 2024-06-26 DIAGNOSIS — G2589 Other specified extrapyramidal and movement disorders: Secondary | ICD-10-CM | POA: Diagnosis not present

## 2024-06-26 DIAGNOSIS — M9902 Segmental and somatic dysfunction of thoracic region: Secondary | ICD-10-CM | POA: Diagnosis not present

## 2024-06-26 DIAGNOSIS — M25562 Pain in left knee: Secondary | ICD-10-CM

## 2024-06-26 DIAGNOSIS — M25561 Pain in right knee: Secondary | ICD-10-CM | POA: Diagnosis not present

## 2024-06-26 LAB — COMPREHENSIVE METABOLIC PANEL WITH GFR
ALT: 6 U/L (ref 0–35)
AST: 13 U/L (ref 0–37)
Albumin: 4 g/dL (ref 3.5–5.2)
Alkaline Phosphatase: 62 U/L (ref 39–117)
BUN: 5 mg/dL — ABNORMAL LOW (ref 6–23)
CO2: 29 meq/L (ref 19–32)
Calcium: 8.7 mg/dL (ref 8.4–10.5)
Chloride: 102 meq/L (ref 96–112)
Creatinine, Ser: 0.95 mg/dL (ref 0.40–1.20)
GFR: 70.8 mL/min (ref >60.00)
Glucose, Bld: 88 mg/dL (ref 70–99)
Potassium: 3.6 meq/L (ref 3.5–5.1)
Sodium: 137 meq/L (ref 135–145)
Total Bilirubin: 0.4 mg/dL (ref 0.2–1.2)
Total Protein: 6.9 g/dL (ref 6.0–8.3)

## 2024-06-26 LAB — MAGNESIUM: Magnesium: 1.8 mg/dL (ref 1.5–2.5)

## 2024-06-26 NOTE — Assessment & Plan Note (Signed)
 Continue to work on Air cabin crew.  Can be contributing to some of the neck and even lower back pain.  Follow-up in 6 to 8 weeks

## 2024-06-26 NOTE — Assessment & Plan Note (Signed)
 Patient given injection today and tolerated the procedure well, discussed icing regimen of home exercises, which activities to do in which ones to avoid.  Increase activity slowly otherwise.  Follow-up again in 6 to 8 weeks

## 2024-06-26 NOTE — Patient Instructions (Addendum)
 Injected elbow today Xray of knees

## 2024-06-26 NOTE — Assessment & Plan Note (Signed)
 Will do a moderately more further workup to make sure nothing else is contributing.

## 2024-06-27 ENCOUNTER — Ambulatory Visit: Payer: Self-pay | Admitting: Family Medicine

## 2024-06-27 ENCOUNTER — Ambulatory Visit: Payer: Self-pay | Admitting: Cardiology

## 2024-06-27 LAB — URINALYSIS
Bilirubin Urine: NEGATIVE
Hgb urine dipstick: NEGATIVE
Ketones, ur: NEGATIVE
Leukocytes,Ua: NEGATIVE
Nitrite: NEGATIVE
Specific Gravity, Urine: <=1.005 — AB (ref 1.000–1.030)
Total Protein, Urine: NEGATIVE
Urine Glucose: NEGATIVE
Urobilinogen, UA: 0.2 (ref 0.0–1.0)
pH: 6 (ref 5.0–8.0)

## 2024-06-27 LAB — POTASSIUM: Potassium: 4 mmol/L (ref 3.5–5.2)

## 2024-06-27 NOTE — Progress Notes (Signed)
 Pt infromed

## 2024-06-28 LAB — ACTH: C206 ACTH: 8 pg/mL (ref 6–50)

## 2024-07-03 LAB — ADH: ADH: <0.8 pg/mL (ref 0.0–4.7)

## 2024-07-05 ENCOUNTER — Ambulatory Visit: Admitting: Professional Counselor

## 2024-07-05 DIAGNOSIS — F3341 Major depressive disorder, recurrent, in partial remission: Secondary | ICD-10-CM

## 2024-07-05 DIAGNOSIS — F418 Other specified anxiety disorders: Secondary | ICD-10-CM

## 2024-07-05 NOTE — Progress Notes (Signed)
 THERAPIST PROGRESS NOTE  Virtual Visit via Video Note  I connected with Kristy Shaw on 07/05/24 at  4:00 PM EDT by a video enabled telemedicine application and verified that I am speaking with the correct person using two identifiers.  Location: Patient: Home Provider: Office   I discussed the limitations of evaluation and management by telemedicine and the availability of in person appointments. The patient expressed understanding and agreed to proceed.  I discussed the assessment and treatment plan with the patient. The patient was provided an opportunity to ask questions and all were answered. The patient agreed with the plan and demonstrated an understanding of the instructions.   The patient was advised to call back or seek an in-person evaluation if the symptoms worsen or if the condition fails to improve as anticipated.  I provided 36 minutes of non-face-to-face time during this encounter. Kristy Shaw, [facility]  Session Time: 4:02 PM - 4:38 PM  Participation Level: Active  Behavioral Response: Casual, Alert, Anxious  Type of Therapy: Individual Therapy  Treatment Goals addressed: Active OP Depression  LTG: Feeling more comfortable, less anxious with things of the unknown. I'm very methodical. I will think of a scenario and play it out every which way to try and figure out how I would react to it.                Start:  02/27/24    Expected End:  02/25/25      STG: I've never really had a close family member pass. It's really hard for me to display certain emotions. To improve emotional intelligence AEB identifying and processing emotions over the next 12 weeks.     STG: I need to schedule my sleep study. To improve wellness AEB attending health appointments, engaging in healthy activities and self-care over the next 12 weeks.     STG: I want to quit smoking. To achieve smoking cessation AEB self-report of using positive replacement activities over the  next 12 weeks.  ProgressTowards Goals: Progressing  Interventions: CBT, Solution Focused, and Supportive  Summary: Kristy Shaw is a 48 y.o. female who presents with a history of anxiety and depression. She appeared alert and oriented x5. She reported her mother had a heart procedure today. She was worried about it but it went well. Kristy Shaw stated she is still avoiding her sister. She agreed some is avoidance and some is healthy boundaries. She still struggles with being assertive. She engaged in discussion about how this might look, specifically with her daughter. She is open to being more consistent, sharing expectations/consequences, and offering choices to help her daughter feel autonomous.   Therapist Response: Conducted session with Kristy Shaw. Began session with check-in/update since previous session. Utilized empathetic and reflective listening. Used open-ended questions to facilitate discussion and summarized thoughts/feelings. Reviewed assertiveness communication and explored ways to be more consistent with daughter and explained choices as a well to promote compliance and autonomy. Scheduled additional appointment and concluded session.   Suicidal/Homicidal: No  Plan: Return again in 6 weeks.  Diagnosis: Other specified anxiety disorders  MDD (major depressive disorder), recurrent, in partial remission (HCC)  Collaboration of Care: Medication Management AEB chart review  Patient/Guardian was advised Release of Information must be obtained prior to any record release in order to collaborate their care with an outside provider. Patient/Guardian was advised if they have not already done so to contact the registration department to sign all necessary forms in order for us  to release information regarding  their care.   Consent: Patient/Guardian gives verbal consent for treatment and assignment of benefits for services provided during this visit. Patient/Guardian expressed understanding  and agreed to proceed.   Kristy Shaw, [facility] 07/05/2024

## 2024-07-13 ENCOUNTER — Other Ambulatory Visit: Payer: Self-pay | Admitting: Psychiatry

## 2024-07-13 DIAGNOSIS — F3342 Major depressive disorder, recurrent, in full remission: Secondary | ICD-10-CM

## 2024-07-13 DIAGNOSIS — F418 Other specified anxiety disorders: Secondary | ICD-10-CM

## 2024-08-13 ENCOUNTER — Ambulatory Visit (INDEPENDENT_AMBULATORY_CARE_PROVIDER_SITE_OTHER): Admitting: Professional Counselor

## 2024-08-13 DIAGNOSIS — F3341 Major depressive disorder, recurrent, in partial remission: Secondary | ICD-10-CM

## 2024-08-13 DIAGNOSIS — F17200 Nicotine dependence, unspecified, uncomplicated: Secondary | ICD-10-CM

## 2024-08-13 DIAGNOSIS — F418 Other specified anxiety disorders: Secondary | ICD-10-CM

## 2024-08-13 NOTE — Progress Notes (Signed)
 THERAPIST PROGRESS NOTE  Virtual Visit via Video Note  I connected with Kristy Shaw on 08/13/24 at  4:00 PM EDT by a video enabled telemedicine application and verified that I am speaking with the correct person using two identifiers.  Location: Patient: Home Provider: Remote office   I discussed the limitations of evaluation and management by telemedicine and the availability of in person appointments. The patient expressed understanding and agreed to proceed.   I discussed the assessment and treatment plan with the patient. The patient was provided an opportunity to ask questions and all were answered. The patient agreed with the plan and demonstrated an understanding of the instructions.   The patient was advised to call back or seek an in-person evaluation if the symptoms worsen or if the condition fails to improve as anticipated.  I provided 46 minutes of non-face-to-face time during this encounter. [name], [facility]  Session Time: 4:05 PM - 4:51 PM   Participation Level: Active  Behavioral Response: Casual, Alert, Anxious  Type of Therapy: Individual Therapy  Treatment Goals addressed: Active OP Depression  LTG: Feeling more comfortable, less anxious with things of the unknown. I'm very methodical. I will think of a scenario and play it out every which way to try and figure out how I would react to it.                Start:  02/27/24    Expected End:  02/25/25      STG: I've never really had a close family member pass. It's really hard for me to display certain emotions. To improve emotional intelligence AEB identifying and processing emotions over the next 12 weeks.     STG: I need to schedule my sleep study. To improve wellness AEB attending health appointments, engaging in healthy activities and self-care over the next 12 weeks.     STG: I want to quit smoking. To achieve smoking cessation AEB self-report of using positive replacement activities  over the next 12 weeks.  ProgressTowards Goals: Progressing  Interventions: Motivational Interviewing, Solution Focused, and Supportive  Summary: Kristy Shaw is a 48 y.o. female who presents with a history of anxiety, depression, and tobacco use disorder. She appeared alert and oriented x5. She reported updates with her family and herself. Kristy Shaw expressed her struggles with smoking cessation. She discussed obstacles and explored solutions to those obstacles. She processed thoughts/feelings contributing to her behaviors such as perfectionism. She was receptive to finding more balanced thinking and using mindfulness techniques to focus more on the moment. Kristy Shaw shared updates with her daughter and shared expectations for the school year.   Therapist Response: Conducted session with Kristy Shaw. Began session with check-in/update since previous session. Utilized empathetic and reflective listening. Explored recent struggles with smoking cessation and ways to overcome those obstacles to change, like urge surfing and replacement activities. Used open-ended questions to facilitate discussion and summarized Kristy Shaw's thoughts/feelings. Modeled ways to have balanced thinking and to move away from perfectionistic ideals. Gained update on communication with daughter and discussed ways to have visual reminders for herself and her daughter. Scheduled additional appointment and concluded session.   Suicidal/Homicidal: No  Plan: Return again in 4 weeks.  Diagnosis: Other specified anxiety disorders  Tobacco use disorder  MDD (major depressive disorder), recurrent, in partial remission (HCC)  Collaboration of Care: Medication Management AEB chart review  Patient/Guardian was advised Release of Information must be obtained prior to any record release in order to collaborate their care  with an outside provider. Patient/Guardian was advised if they have not already done so to contact the registration department  to sign all necessary forms in order for us  to release information regarding their care.   Consent: Patient/Guardian gives verbal consent for treatment and assignment of benefits for services provided during this visit. Patient/Guardian expressed understanding and agreed to proceed.   [name], [facility] 08/13/2024

## 2024-08-16 ENCOUNTER — Telehealth: Admitting: Psychiatry

## 2024-08-16 ENCOUNTER — Encounter: Payer: Self-pay | Admitting: Psychiatry

## 2024-08-16 DIAGNOSIS — F3342 Major depressive disorder, recurrent, in full remission: Secondary | ICD-10-CM

## 2024-08-16 DIAGNOSIS — F418 Other specified anxiety disorders: Secondary | ICD-10-CM

## 2024-08-16 DIAGNOSIS — G479 Sleep disorder, unspecified: Secondary | ICD-10-CM

## 2024-08-16 DIAGNOSIS — F17200 Nicotine dependence, unspecified, uncomplicated: Secondary | ICD-10-CM | POA: Diagnosis not present

## 2024-08-16 DIAGNOSIS — F1590 Other stimulant use, unspecified, uncomplicated: Secondary | ICD-10-CM

## 2024-08-16 MED ORDER — VENLAFAXINE HCL ER 150 MG PO CP24: 150.0000 mg | ORAL_CAPSULE | Freq: Every day | ORAL | 1 refills | Status: AC

## 2024-08-16 MED ORDER — GABAPENTIN 300 MG PO CAPS
300.0000 mg | ORAL_CAPSULE | Freq: Four times a day (QID) | ORAL | Status: DC
Start: 2024-08-16 — End: 2024-11-01
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
## 2024-08-23 NOTE — Progress Notes (Unsigned)
 Kristy [facility] [address] [place] [address] Phone: [phone] Subjective:   Kristy Shaw am a scribe for Dr. Claudene.  I'm seeing this patient by the request  of:  Fernand Fredy RAMAN, MD  CC: Left knee, right knee back and neck pain  YEP:Dlagzrupcz  Kristy Shaw is a 48 y.o. female coming in with complaint of back and neck pain. OMT on 06/26/2024. Also seen for elbow pain. Patient states back and neck are pretty good. For the last few weeks she has been having problems in her knees. The left knee felt like it was going to buckle when baring weight. Right one hurts but it doesn't buckle. Would like for the doctor to check her knees today.  Medications patient has been prescribed:   Taking:         Reviewed prior external information including notes and imaging from previsou exam, outside providers and external EMR if available.   As well as notes that were available from care everywhere and other healthcare systems.  Past medical history, social, surgical and family history all reviewed in electronic medical record.  No pertanent information unless stated regarding to the chief complaint.   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[facility])    Depression    Dyspnea    Dysrhythmia    IRREGULAR HEART BEAT   Family history of adverse reaction to anesthesia    DAD-STOKE COMING OUT OF ANESTHESIA    Family history of breast cancer    Fatigue    Fibromyalgia    GERD (gastroesophageal reflux disease)    OCC   IBS (irritable bowel syndrome)    Mitral valve prolapse    Restless leg syndrome    Sleep apnea    HAS CPAP MACHINE BUT DOES NOT USE ON A REGULAR BASIS    Allergies  Allergen Reactions   Gold-Containing Drug Products Dermatitis   Adhesive [Tape] Rash   Doxycycline  Rash      Review of Systems:  No headache, visual changes, nausea, vomiting, diarrhea, constipation, dizziness, abdominal pain, skin rash, fevers, chills, night sweats, weight loss, swollen lymph nodes, body aches, joint swelling, chest pain, shortness of breath, mood changes. POSITIVE muscle aches  Objective  Blood pressure 110/60, pulse 65, height 5' 9 (1.753 m), weight 160 lb 3.2 oz (72.7 kg), SpO2 95%.   General: No apparent distress alert and oriented x3 mood and affect normal, dressed appropriately.  HEENT: Pupils equal, extraocular movements intact  Respiratory: Patient's speak in full sentences and does not appear short of breath  Cardiovascular: No lower extremity edema, non tender, no erythema  Gait relatively normal MSK:  Back does have some loss lordosis.  Neck exam does have some tenderness to palpation diffusely.  Negative Spurling's. Low back on the left side does have some tenderness to palpation in the sacroiliac joint. Trace effusion noted of the patellofemoral joint.  Patient does have lateral tracking.  Positive patellar grind on the left.  The other side.   Osteopathic findings  C2 flexed rotated and side bent right C7 flexed rotated and side bent right T3 extended rotated and side bent right inhaled rib T9 extended rotated and side bent left L2 flexed rotated  and side bent right Sacrum left on left   Limited muscular skeletal ultrasound was performed and interpreted by Shaw HUSSAR, M   Limited ultrasound shows some breakdown of the patellofemoral joint noted on the left side.  Some narrowing noted of the lateral aspect of the joint.   Assessment and Plan:  Lateral subluxation of left patella New problem secondary to patient's hypermobility as well although an underlying condition which could make this difficult.  A Tru pull lite bracing given.  Discussed which activities to do and which ones to avoid.  Increase activity slowly.  Discussed icing regimen.   Discussed topical anti-inflammatories.  If worsening pain will consider injection.  X-rays pending  Degenerative disc disease, cervical Known arthritic changes, discussed posture and ergonomics, discussed which activities to do and which ones to avoid.  Increase activity slowly.  Discussed icing regimen.  Follow-up again in 6 to 8 weeks.    Nonallopathic problems  Decision today to treat with OMT was based on Physical Exam  After verbal consent patient was treated with HVLA, ME, FPR techniques in cervical, rib, thoracic, lumbar, and sacral  areas  Patient tolerated the procedure well with improvement in symptoms  Patient given exercises, stretches and lifestyle modifications  See medications in patient instructions if given  Patient will follow up in 4-8 weeks    The above documentation has been reviewed and is accurate and complete Kristy Shaw M Kristy Bena, DO          Note: This dictation was prepared with Dragon dictation along with smaller phrase technology. Any transcriptional errors that result from this process are unintentional.

## 2024-08-27 ENCOUNTER — Other Ambulatory Visit: Payer: Self-pay

## 2024-08-27 ENCOUNTER — Encounter: Payer: Self-pay | Admitting: Family Medicine

## 2024-08-27 ENCOUNTER — Ambulatory Visit (INDEPENDENT_AMBULATORY_CARE_PROVIDER_SITE_OTHER)

## 2024-08-27 ENCOUNTER — Ambulatory Visit: Admitting: Family Medicine

## 2024-08-27 VITALS — BP 110/60 | HR 65 | Ht 69.0 in | Wt 160.2 lb

## 2024-08-27 DIAGNOSIS — M9908 Segmental and somatic dysfunction of rib cage: Secondary | ICD-10-CM

## 2024-08-27 DIAGNOSIS — M25562 Pain in left knee: Secondary | ICD-10-CM

## 2024-08-27 DIAGNOSIS — M5030 Other cervical disc degeneration, unspecified cervical region: Secondary | ICD-10-CM

## 2024-08-27 DIAGNOSIS — M9904 Segmental and somatic dysfunction of sacral region: Secondary | ICD-10-CM | POA: Diagnosis not present

## 2024-08-27 DIAGNOSIS — M9903 Segmental and somatic dysfunction of lumbar region: Secondary | ICD-10-CM

## 2024-08-27 DIAGNOSIS — M25561 Pain in right knee: Secondary | ICD-10-CM | POA: Diagnosis not present

## 2024-08-27 DIAGNOSIS — S83012A Lateral subluxation of left patella, initial encounter: Secondary | ICD-10-CM

## 2024-08-27 DIAGNOSIS — M9902 Segmental and somatic dysfunction of thoracic region: Secondary | ICD-10-CM

## 2024-08-27 DIAGNOSIS — M9901 Segmental and somatic dysfunction of cervical region: Secondary | ICD-10-CM | POA: Diagnosis not present

## 2024-08-27 NOTE — Assessment & Plan Note (Signed)
 Known arthritic changes, discussed posture and ergonomics, discussed which activities to do and which ones to avoid.  Increase activity slowly.  Discussed icing regimen.  Follow-up again in 6 to 8 weeks.

## 2024-08-27 NOTE — Patient Instructions (Signed)
 Good to see you. X-ray on the way out. Patellar femoral HEP.  Brace for left knee.  See me again in 6 to 8 weeks.

## 2024-08-27 NOTE — Assessment & Plan Note (Signed)
 New problem secondary to patient's hypermobility as well although an underlying condition which could make this difficult.  A Tru pull lite bracing given.  Discussed which activities to do and which ones to avoid.  Increase activity slowly.  Discussed icing regimen.  Discussed topical anti-inflammatories.  If worsening pain will consider injection.  X-rays pending

## 2024-09-10 ENCOUNTER — Ambulatory Visit (INDEPENDENT_AMBULATORY_CARE_PROVIDER_SITE_OTHER): Admitting: Professional Counselor

## 2024-09-10 DIAGNOSIS — F418 Other specified anxiety disorders: Secondary | ICD-10-CM

## 2024-09-10 DIAGNOSIS — F3342 Major depressive disorder, recurrent, in full remission: Secondary | ICD-10-CM

## 2024-09-10 DIAGNOSIS — F17200 Nicotine dependence, unspecified, uncomplicated: Secondary | ICD-10-CM

## 2024-09-10 NOTE — Progress Notes (Signed)
 THERAPIST PROGRESS NOTE  Virtual Visit via Video Note  I connected with Kristy Shaw on 09/10/24 at  4:00 PM EDT by a video enabled telemedicine application and verified that I am speaking with the correct person using two identifiers.  Location: Patient: Home Provider: Remote office   I discussed the limitations of evaluation and management by telemedicine and the availability of in person appointments. The patient expressed understanding and agreed to proceed.   I discussed the assessment and treatment plan with the patient. The patient was provided an opportunity to ask questions and all were answered. The patient agreed with the plan and demonstrated an understanding of the instructions.   The patient was advised to call back or seek an in-person evaluation if the symptoms worsen or if the condition fails to improve as anticipated.  I provided 43 minutes of non-face-to-face time during this encounter. Kristy Shaw, [facility]  Session Time: 4:04 PM - 4:47 PM   Participation Level: Active  Behavioral Response: Casual, Alert, Anxious  Type of Therapy: Individual Therapy  Treatment Goals addressed: Active OP Depression  LTG: Feeling more comfortable, less anxious with things of the unknown. I'm very methodical. I will think of a scenario and play it out every which way to try and figure out how I would react to it.                Start:  02/27/24    Expected End:  02/25/25      STG: I've never really had a close family member pass. It's really hard for me to display certain emotions. To improve emotional intelligence AEB identifying and processing emotions over the next 12 weeks.     STG: I need to schedule my sleep study. To improve wellness AEB attending health appointments, engaging in healthy activities and self-care over the next 12 weeks.     STG: I want to quit smoking. To achieve smoking cessation AEB self-report of using positive replacement activities  over the next 12 weeks.  ProgressTowards Goals: Progressing  Interventions: Motivational Interviewing, Solution Focused, Assertiveness Training, and Supportive  Summary: Kristy Shaw is a 48 y.o. female who presents with a history of anxiety and depression. She appeared alert and oriented x5. She stated things are going well overall. She shared how current events/political climate has been affecting her. Merica shared she would like to find a different church than her parents church. She was receptive to ways to search for something different. Lucielle noted how she is doing with smoking cessation. She reported things are going well with her daughter but they still need to work on things as a family. She was receptive to confrontation and agreed there are things she could do differently. She expressed feeling overwhelmed with so many changes and identified her CPAP routine is a good place to start. Dyanne discussed the frustrations of being in menopause and explored ways to work on this with her healthcare providers.   Therapist Response: Conducted session with Kristy Shaw. Began session with check-in/update since previous session. Utilized empathetic and reflective listening. Used open-ended questions to facilitate discussion and summarized Kristy Shaw's thoughts/feelings. Explored ways to find a new church and improve on smoking cessation. Used gentle confrontation about Amal's struggle to control other's behaviors and noted ways to refocus on what's within her control (emotion regulation, communication). Encouraged Kristy Shaw to advocate for herself with medical providers as she navigates transitions/menopause. Scheduled additional appointment and concluded session.   Suicidal/Homicidal: No  Plan: Return again  in 5 weeks.  Diagnosis: Other specified anxiety disorders  Tobacco use disorder  MDD (major depressive disorder), recurrent, in full remission  Collaboration of Care: Medication Management AEB  chart review  Patient/Guardian was advised Release of Information must be obtained prior to any record release in order to collaborate their care with an outside provider. Patient/Guardian was advised if they have not already done so to contact the registration department to sign all necessary forms in order for us  to release information regarding their care.   Consent: Patient/Guardian gives verbal consent for treatment and assignment of benefits for services provided during this visit. Patient/Guardian expressed understanding and agreed to proceed.   Kristy Shaw, [facility] 09/10/2024

## 2024-10-04 NOTE — Progress Notes (Unsigned)
 [name] [address] [place] [address] Phone: [phone] Subjective:   Kristy Shaw am a scribe for Dr. Claudene.   I'm seeing this patient by the request  of:  Fernand Fredy RAMAN, MD  CC: Hypermobility, back pain and left knee pain  YEP:Dlagzrupcz  Kristy Shaw is a 48 y.o. female coming in with complaint of back and neck pain. OMT on 08/27/2024. Also had some L knee pain. Patient states neck is a little sore and the back is pretty good. L knee seems to be better. It hasn't been giving her any problems.  Medications patient has been prescribed:   Taking:         Reviewed prior external information including notes and imaging from previsou exam, outside providers and external EMR if available.   As well as notes that were available from care everywhere and other healthcare systems.  Past medical history, social, surgical and family history all reviewed in electronic medical record.  No pertanent information unless stated regarding to the chief complaint.   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[facility])    Depression    Dyspnea    Dysrhythmia    IRREGULAR HEART BEAT   Family history of adverse reaction to anesthesia    DAD-STOKE COMING OUT OF ANESTHESIA    Family history of breast cancer    Fatigue    Fibromyalgia    GERD (gastroesophageal reflux disease)    OCC   IBS (irritable bowel syndrome)    Mitral valve prolapse    Restless leg syndrome    Sleep apnea    HAS CPAP MACHINE BUT DOES NOT USE ON A REGULAR BASIS    Allergies  Allergen Reactions   Gold-Containing Drug Products Dermatitis   Adhesive [Tape] Rash   Doxycycline  Rash     Review of Systems:  No headache, visual changes, nausea, vomiting, diarrhea, constipation, dizziness, abdominal pain, skin  rash, fevers, chills, night sweats, weight loss, swollen lymph nodes, body aches, joint swelling, chest pain, shortness of breath, mood changes. POSITIVE muscle aches  Objective  There were no vitals taken for this visit.   General: No apparent distress alert and oriented x3 mood and affect normal, dressed appropriately.  HEENT: Pupils equal, extraocular movements intact  Respiratory: Patient's speak in full sentences and does not appear short of breath  Cardiovascular: No lower extremity edema, non tender, no erythema  Gait MSK:  Back scapular dyskinesis noted.  Tightness noted in the paraspinal musculature.  Seems to be more right greater than left.  Patient does have dyskinesis bilaterally.  Neck exam does have some loss lordosis.  Tightness noted in the paraspinal musculature.  Osteopathic findings  C3 flexed rotated and side bent right C6 flexed rotated and side bent left T3 extended rotated and side bent right inhaled rib T6 extended rotated and side bent left L2 flexed rotated and side bent right L3 flexed rotated and side bent left Sacrum right on right       Assessment and Plan:  No problem-specific Assessment & Plan notes found for this encounter.    Nonallopathic problems  Decision today to treat with OMT was based on Physical Exam  After verbal consent  patient was treated with HVLA, ME, FPR techniques in cervical, rib, thoracic, lumbar, and sacral  areas  Patient tolerated the procedure well with improvement in symptoms  Patient given exercises, stretches and lifestyle modifications  See medications in patient instructions if given  Patient will follow up in 4-8 weeks             Note: This dictation was prepared with Dragon dictation along with smaller phrase technology. Any transcriptional errors that result from this process are unintentional.

## 2024-10-08 ENCOUNTER — Ambulatory Visit: Admitting: Family Medicine

## 2024-10-08 VITALS — BP 110/60 | HR 77 | Ht 69.0 in | Wt 163.0 lb

## 2024-10-08 DIAGNOSIS — M9908 Segmental and somatic dysfunction of rib cage: Secondary | ICD-10-CM | POA: Diagnosis not present

## 2024-10-08 DIAGNOSIS — M9903 Segmental and somatic dysfunction of lumbar region: Secondary | ICD-10-CM

## 2024-10-08 DIAGNOSIS — M9901 Segmental and somatic dysfunction of cervical region: Secondary | ICD-10-CM | POA: Diagnosis not present

## 2024-10-08 DIAGNOSIS — M9904 Segmental and somatic dysfunction of sacral region: Secondary | ICD-10-CM | POA: Diagnosis not present

## 2024-10-08 DIAGNOSIS — M9902 Segmental and somatic dysfunction of thoracic region: Secondary | ICD-10-CM

## 2024-10-08 DIAGNOSIS — G2589 Other specified extrapyramidal and movement disorders: Secondary | ICD-10-CM | POA: Diagnosis not present

## 2024-10-08 NOTE — Patient Instructions (Signed)
 Good to see you again [place].  See me again in 3 months.

## 2024-10-09 ENCOUNTER — Encounter: Payer: Self-pay | Admitting: Family Medicine

## 2024-10-09 NOTE — Assessment & Plan Note (Signed)
 Continue to work on [employer], continue to be active.  Increase activity as tolerated.  Follow-up again in 6 to 8 weeks

## 2024-10-10 ENCOUNTER — Other Ambulatory Visit: Payer: Self-pay | Admitting: Psychiatry

## 2024-10-10 DIAGNOSIS — F1590 Other stimulant use, unspecified, uncomplicated: Secondary | ICD-10-CM

## 2024-10-10 DIAGNOSIS — F3342 Major depressive disorder, recurrent, in full remission: Secondary | ICD-10-CM

## 2024-10-12 ENCOUNTER — Encounter: Payer: Self-pay | Admitting: Cardiology

## 2024-10-12 ENCOUNTER — Ambulatory Visit: Admitting: Cardiology

## 2024-10-12 ENCOUNTER — Other Ambulatory Visit: Payer: Self-pay | Admitting: Dermatology

## 2024-10-12 VITALS — BP 132/64 | HR 97 | Ht 69.0 in | Wt 162.0 lb

## 2024-10-12 DIAGNOSIS — Z0130 Encounter for examination of blood pressure without abnormal findings: Secondary | ICD-10-CM

## 2024-10-12 DIAGNOSIS — F17200 Nicotine dependence, unspecified, uncomplicated: Secondary | ICD-10-CM

## 2024-10-12 DIAGNOSIS — Z131 Encounter for screening for diabetes mellitus: Secondary | ICD-10-CM | POA: Diagnosis not present

## 2024-10-12 DIAGNOSIS — L2089 Other atopic dermatitis: Secondary | ICD-10-CM

## 2024-10-12 DIAGNOSIS — Z13220 Encounter for screening for lipoid disorders: Secondary | ICD-10-CM | POA: Diagnosis not present

## 2024-10-12 DIAGNOSIS — R002 Palpitations: Secondary | ICD-10-CM

## 2024-10-12 DIAGNOSIS — E876 Hypokalemia: Secondary | ICD-10-CM | POA: Diagnosis not present

## 2024-10-12 DIAGNOSIS — G4733 Obstructive sleep apnea (adult) (pediatric): Secondary | ICD-10-CM | POA: Diagnosis not present
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
## 2024-10-15 ENCOUNTER — Ambulatory Visit: Admitting: Professional Counselor

## 2024-10-17 ENCOUNTER — Other Ambulatory Visit

## 2024-10-17 DIAGNOSIS — Z131 Encounter for screening for diabetes mellitus: Secondary | ICD-10-CM

## 2024-10-17 DIAGNOSIS — E876 Hypokalemia: Secondary | ICD-10-CM

## 2024-10-17 DIAGNOSIS — Z13220 Encounter for screening for lipoid disorders: Secondary | ICD-10-CM

## 2024-10-18 LAB — CMP14+EGFR
ALT: 5 IU/L (ref 0–32)
AST: 11 IU/L (ref 0–40)
Albumin: 4.1 g/dL (ref 3.9–4.9)
Alkaline Phosphatase: 83 IU/L (ref 41–116)
BUN/Creatinine Ratio: 5 — ABNORMAL LOW (ref 9–23)
BUN: 5 mg/dL — ABNORMAL LOW (ref 6–24)
Bilirubin Total: 0.3 mg/dL (ref 0.0–1.2)
CO2: 25 mmol/L (ref 20–29)
Calcium: 9.4 mg/dL (ref 8.7–10.2)
Chloride: 106 mmol/L (ref 96–106)
Creatinine, Ser: 1.01 mg/dL — ABNORMAL HIGH (ref 0.57–1.00)
Globulin, Total: 2.8 g/dL (ref 1.5–4.5)
Glucose: 91 mg/dL (ref 70–99)
Potassium: 4.2 mmol/L (ref 3.5–5.2)
Sodium: 144 mmol/L (ref 134–144)
Total Protein: 6.9 g/dL (ref 6.0–8.5)
eGFR: 69 mL/min/1.73 (ref >59)

## 2024-10-18 LAB — LIPID PANEL
Chol/HDL Ratio: 4.9 ratio — ABNORMAL HIGH (ref 0.0–4.4)
Cholesterol, Total: 186 mg/dL (ref 100–199)
HDL: 38 mg/dL — ABNORMAL LOW (ref >39)
LDL Chol Calc (NIH): 123 mg/dL — ABNORMAL HIGH (ref 0–99)
Triglycerides: 137 mg/dL (ref 0–149)
VLDL Cholesterol Cal: 25 mg/dL (ref 5–40)

## 2024-10-18 LAB — HEMOGLOBIN A1C
Est. average glucose Bld gHb Est-mCnc: 103 mg/dL
Hgb A1c MFr Bld: 5.2 % (ref 4.8–5.6)

## 2024-10-19 ENCOUNTER — Ambulatory Visit: Payer: Self-pay | Admitting: Cardiology

## 2024-10-23 ENCOUNTER — Ambulatory Visit: Admitting: Dermatology

## 2024-10-23 DIAGNOSIS — L84 Corns and callosities: Secondary | ICD-10-CM | POA: Diagnosis not present

## 2024-10-23 DIAGNOSIS — L2089 Other atopic dermatitis: Secondary | ICD-10-CM

## 2024-10-23 DIAGNOSIS — Z7189 Other specified counseling: Secondary | ICD-10-CM

## 2024-10-23 DIAGNOSIS — Z79899 Other long term (current) drug therapy: Secondary | ICD-10-CM | POA: Diagnosis not present

## 2024-10-23 DIAGNOSIS — L209 Atopic dermatitis, unspecified: Secondary | ICD-10-CM | POA: Diagnosis not present

## 2024-10-23 MED ORDER — DUPIXENT 300 MG/2ML ~~LOC~~ SOSY
PREFILLED_SYRINGE | SUBCUTANEOUS | 5 refills | Status: AC
Start: 2024-10-23 — End: ?

## 2024-10-23 NOTE — Patient Instructions (Addendum)
 Recommend using Curad Mediplast pads. Cut to fit wart or callus. Cover with Elastoplast waterproof tape or any waterproof band-aid. Change every 3 to 4 days, or sooner if necessary.  Treatment may require several months of regular use before results are seen.    Due to recent changes in healthcare laws, you may see results of your pathology and/or laboratory studies on MyChart before the doctors have had a chance to review them. We understand that in some cases there may be results that are confusing or concerning to you. Please understand that not all results are received at the same time and often the doctors may need to interpret multiple results in order to provide you with the best plan of care or course of treatment. Therefore, we ask that you please give us  2 business days to thoroughly review all your results before contacting the office for clarification. Should we see a critical lab result, you will be contacted sooner.   If You Need Anything After Your Visit  If you have any questions or concerns for your doctor, please call our main line at [phone] and press option 4 to reach your doctor's medical assistant. If no one answers, please leave a voicemail as directed and we will return your call as soon as possible. Messages left after 4 pm will be answered the following business day.   You may also send us  a message via MyChart. We typically respond to MyChart messages within 1-2 business days.  For prescription refills, please ask your pharmacy to contact our office. Our fax number is [phone].  If you have an urgent issue when the clinic is closed that cannot wait until the next business day, you can page your doctor at the number below.    Please note that while we do our best to be available for urgent issues outside of office hours, we are not available 24/7.   If you have an urgent issue and are unable to reach us , you may choose to seek medical care at your doctor's  office, retail clinic, urgent care center, or emergency room.  If you have a medical emergency, please immediately call 911 or go to the emergency department.  Pager Numbers  - Dr. Hester: [phone]  - Dr. Jackquline: [phone]  - Dr. Claudene: [phone]   - Dr. Raymund: [phone]  In the event of inclement weather, please call our main line at [phone] for an update on the status of any delays or closures.  Dermatology Medication Tips: Please keep the boxes that topical medications come in in order to help keep track of the instructions about where and how to use these. Pharmacies typically print the medication instructions only on the boxes and not directly on the medication tubes.   If your medication is too expensive, please contact our office at [phone] option 4 or send us  a message through MyChart.   We are unable to tell what your co-pay for medications will be in advance as this is different depending on your insurance coverage. However, we may be able to find a substitute medication at lower cost or fill out paperwork to get insurance to cover a needed medication.   If a prior authorization is required to get your medication covered by your insurance company, please allow us  1-2 business days to complete this process.  Drug prices often vary depending on where the prescription is filled and some pharmacies may offer cheaper prices.  The website www.goodrx.com contains coupons for medications through  different pharmacies. The prices here do not account for what the cost may be with help from insurance (it may be cheaper with your insurance), but the website can give you the price if you did not use any insurance.  - You can print the associated coupon and take it with your prescription to the pharmacy.  - You may also stop by our office during regular business hours and pick up a GoodRx coupon card.  - If you need your prescription sent electronically to a  different pharmacy, notify our office through [facility] or by phone at [phone] option 4.     Si Usted Necesita Algo Despus de Su Visita  Tambin puede enviarnos un mensaje a travs de Clinical cytogeneticist. Por lo general respondemos a los mensajes de MyChart en el transcurso de 1 a 2 das hbiles.  Para renovar recetas, por favor pida a su farmacia que se ponga en contacto con nuestra oficina. Randi lakes de fax es [place] [phone].  Si tiene un asunto urgente cuando la clnica est cerrada y que no puede esperar hasta el siguiente da hbil, puede llamar/localizar a su doctor(a) al nmero que aparece a continuacin.   Por favor, tenga en cuenta que aunque hacemos todo lo posible para estar disponibles para asuntos urgentes fuera del horario de [place], no estamos disponibles las 24 horas del da, los 7 [address]  [address] de la [place].   Si tiene un problema urgente y no puede comunicarse con nosotros, puede optar por buscar atencin mdica  en el consultorio de su doctor(a), en una clnica privada, en un centro de atencin urgente o en una sala de emergencias.  Si tiene Engineer, drilling, por favor llame inmediatamente al 911 o vaya a la sala de emergencias.  Nmeros de bper  - Dr. Hester: [phone]  - Dra. Jackquline: [phone]  - Dr. Claudene: [phone]  - Dra. Kitts: [phone]  En caso de inclemencias del [place], por favor llame a nuestra lnea principal al [phone] para una actualizacin sobre el estado de cualquier retraso o cierre.  Consejos para la medicacin en dermatologa: Por favor, guarde las cajas en las que vienen los medicamentos de uso tpico para ayudarle a seguir las instrucciones sobre dnde y cmo usarlos. Las farmacias generalmente imprimen las instrucciones del medicamento slo en las cajas y no directamente en los tubos del [place].   Si su medicamento es muy caro, por favor, pngase en contacto con landry rieger llamando al [phone] y  presione la opcin 4 o envenos un mensaje a travs de Clinical cytogeneticist.   No podemos decirle cul ser su copago por los medicamentos por adelantado ya que esto es diferente dependiendo de la cobertura de su seguro. Sin embargo, es posible que podamos encontrar un medicamento sustituto a Audiological scientist un formulario para que el seguro cubra el medicamento que se considera necesario.   Si se requiere una autorizacin previa para que su compaa de seguros malta su medicamento, por favor permtanos de 1 a 2 das hbiles para completar este proceso.  Los precios de los medicamentos varan con frecuencia dependiendo del Environmental consultant de dnde se surte la receta y alguna farmacias pueden ofrecer precios ms baratos.  El sitio web www.goodrx.com tiene cupones para medicamentos de Health and safety inspector. Los precios aqu no tienen en cuenta lo que podra costar con la ayuda del seguro (puede ser ms barato con su seguro), pero el sitio web puede darle el precio si no utiliz Tourist information centre manager.  - Puede imprimir el  cupn correspondiente y llevarlo con su receta a la farmacia.  - Tambin puede pasar por nuestra oficina durante el horario de atencin regular y Education officer, museum una tarjeta de cupones de GoodRx.  - Si necesita que su receta se enve electrnicamente a una farmacia diferente, informe a nuestra oficina a travs de MyChart de [place] o por telfono llamando al [phone] y presione la opcin 4.

## 2024-10-23 NOTE — Progress Notes (Signed)
   Follow-Up Visit   Subjective  Kristy Shaw is a 48 y.o. female who presents for the following: Atopic Dermatitis, on Dupixent . Denies side effects. No eye irritation. She has TMC 0.1% Cream, Clobetasol  cream, clobetasol orlie mix if needed. Patient states that she rarely has breakouts.   Bump on the right foot x 1 month. No new shoes/rubbing.    The following portions of the chart were reviewed this encounter and updated as appropriate: medications, allergies, medical history  Review of Systems:  No other skin or systemic complaints except as noted in HPI or Assessment and Plan.  Objective  Well appearing patient in no apparent distress; mood and affect are within normal limits.  Areas Examined: Face, right foot  Relevant physical exam findings are noted in the Assessment and Plan.    Assessment & Plan   OTHER ATOPIC DERMATITIS   Related Medications tacrolimus  (PROTOPIC ) 0.1 % ointment Apply topically 2 (two) times daily. dupilumab  (DUPIXENT ) 300 MG/2ML prefilled syringe INJECT 1 SYRINGE (300MG ) SUBCUTANEOUSLY EVERY 14 DAYS.   ATOPIC DERMATITIS Exam: Skin clear today <1% BSA  Chronic condition with duration or expected duration over one year. Currently well-controlled on Dupixent .   Atopic dermatitis - Severe, on Dupixent  (biologic medication).  Atopic dermatitis (eczema) is a chronic, relapsing, pruritic condition that can significantly affect quality of life. It is often associated with allergic rhinitis and/or asthma and can require treatment with topical medications, phototherapy, or in severe cases biologic medications, which require long term medication management.    Treatment Plan:  Continue Dupixent  300 mg/2mL SQ QOW. Patient denies side effects.   Cont TMC 0.1% cream to spot treat itchy bumps on body as needed. Avoid applying to face, groin, and axilla. Use as directed. Long-term use can cause thinning of the skin. Continue tacrolimus  ointment 0.1 %  - apply topically to aa of face and body bid prn for itchy spots  Continue clobetasol /CeraVe mix qd/bid or Clobetasol  ointment prn flares. Avoid face, groin, axilla.   Potential side effects include allergic reaction, herpes infections, injection site reactions and conjunctivitis (inflammation of the eyes).  The use of Dupixent  requires long term medication management, including periodic office visits.    Long term medication management.  Patient is using long term (months to years) prescription medication  to control their dermatologic condition.  These medications require periodic monitoring to evaluate for efficacy and side effects and may require periodic laboratory monitoring.   Recommend gentle skin care.  CALLUS Exam: Firm hyperkeratotic nodule at the right medial great toe at boney prominence; no interruption of skin lines.  Recommend using Curad Mediplast pads. Cut to fit wart or callus. Cover with Elastoplast waterproof tape or any waterproof band-aid. Change every 3 to 4 days, or sooner if necessary.  Treatment may require several months of regular use before results are seen.  Recommend cushioned pad to prevent pressure/rubbing. If not improving, recommend patient see podiatrist.   Return in about 6 months (around 04/22/2025) for Atopic Dermatitis.  IAndrea Kerns, CMA, am acting as scribe for Rexene Rattler, MD .   Documentation: I have reviewed the above documentation for accuracy and completeness, and I agree with the above.  Rexene Rattler, MD

## 2024-11-01 ENCOUNTER — Inpatient Hospital Stay: Payer: 59 | Attending: Hematology and Oncology | Admitting: Hematology and Oncology

## 2024-11-01 VITALS — BP 136/78 | HR 69 | Temp 98.5°F | Resp 16 | Ht 69.0 in | Wt 160.9 lb

## 2024-11-01 DIAGNOSIS — N6489 Other specified disorders of breast: Secondary | ICD-10-CM

## 2024-11-01 DIAGNOSIS — Z79899 Other long term (current) drug therapy: Secondary | ICD-10-CM | POA: Insufficient documentation

## 2024-11-01 DIAGNOSIS — F17210 Nicotine dependence, cigarettes, uncomplicated: Secondary | ICD-10-CM | POA: Insufficient documentation

## 2024-11-01 DIAGNOSIS — N6031 Fibrosclerosis of right breast: Secondary | ICD-10-CM | POA: Insufficient documentation

## 2024-11-01 NOTE — Progress Notes (Signed)
 Patient Care Team: Fernand Fredy RAMAN, MD as PCP - General (Internal Medicine) Fernand Fredy RAMAN, MD as Referring Physician (Internal Medicine) Dellie Louanne MATSU, MD (General Surgery)  DIAGNOSIS:  Encounter Diagnosis  Name Primary?   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  She has been on tamoxifen  for the past 3 years in 3 months.  She has been experiencing menopausal symptoms like severe hot flashes and she was recently placed on estrogen replacement therapy by her primary care physician.  She is also trying to quit smoking.      ALLERGIES:  is allergic to gold-containing drug products, adhesive [tape], and doxycycline .  MEDICATIONS:  Current Outpatient Medications  Medication Sig Dispense Refill   albuterol  (VENTOLIN  HFA) 108 (90 Base) MCG/ACT inhaler INHALE ONE TO TWO PUFFS BY MOUTH INTO THE LUNGS EVERY 6 HOURS  AS NEEDED FOR WHEEZING OR SHORTNESS OF BREATH 8.5 g 0   ARIPiprazole  (ABILIFY ) 2 MG tablet TAKE ONE TABLET BY MOUTH ONCE A DAY 90 tablet 1   ASMANEX  HFA 100 MCG/ACT AERO Inhale 2 puffs into the lungs 2 (two) times daily. 13 g 3   cetirizine (ZYRTEC) 10 MG tablet Take by mouth.     clobetasol  ointment (TEMOVATE ) 0.05 % Apply 1 application topically 2 (two) times daily. 30 g 1   dupilumab  (DUPIXENT ) 300 MG/2ML prefilled syringe INJECT 1 SYRINGE (300MG ) SUBCUTANEOUSLY EVERY 14 DAYS. 4 mL 5   estradiol (CLIMARA - DOSED IN MG/24 HR) 0.05 mg/24hr patch Place 0.05 mg onto the skin once a week.     fluticasone  (FLONASE ) 50 MCG/ACT nasal spray Place 1 spray into both nostrils daily. 15.8 mL 2   furosemide  (LASIX ) 20 MG tablet Take 1 tablet (20 mg total) by mouth daily for 3 days. 30 tablet 0   gabapentin  (NEURONTIN ) 300 MG capsule Take 1 capsule (300 mg total) by mouth  4 (four) times daily. Has supplies and wants to wean off herself     hydrOXYzine  (VISTARIL ) 25 MG capsule TAKE 1 CAPSULE (25 MG TOTAL) BY MOUTH DAILY AS NEEDED. FOR SEVERE ANXIETY SYMPTOMS 90 capsule 1   meloxicam  (MOBIC ) 15 MG tablet Take 1 tablet (15 mg total) by mouth daily. 30 tablet 0   montelukast (SINGULAIR) 10 MG tablet Take 10 mg by mouth daily.     nicotine  (NICODERM CQ  - DOSED IN MG/24 HR) 7 mg/24hr patch Place 1 patch (7 mg total) onto the skin daily. 28 patch 1   nicotine  polacrilex (NICORETTE ) 2 MG gum Take 1 each (2 mg total) by mouth every 4 (four) hours while awake. 100 tablet 0   potassium chloride  (KLOR-CON ) 10 MEQ tablet Take 1 tablet (10 mEq total) by mouth daily. 30 tablet 4   progesterone (PROMETRIUM) 100 MG capsule Take 100 mg by mouth daily.     solifenacin (VESICARE) 5 MG tablet Take by mouth.     tacrolimus  (PROTOPIC ) 0.1 % ointment Apply topically 2 (two) times daily. 100 g 2   tamoxifen  (NOLVADEX ) 10 MG tablet Take 0.5 tablets (5 mg total) by mouth daily. 90 tablet 1   TRELEGY ELLIPTA 100-62.5-25 MCG/ACT AEPB Inhale 1 puff into the lungs daily.     venlafaxine  XR (EFFEXOR -XR) 150 MG 24 hr capsule Take 1 capsule (150 mg total) by mouth daily with breakfast.  Take along with 75 mg daily 90 capsule 1   venlafaxine  XR (EFFEXOR -XR) 75 MG 24 hr capsule TAKE ONE CAPSULE BY MOUTH ONCE A DAY WITH BREAKFAST ALONG WITH 150MG  DOSE 90 capsule 1   Vitamin D , Ergocalciferol , (DRISDOL ) 1.25 MG (50000 UNIT) CAPS capsule Take 1 capsule (50,000 Units total) by mouth every 7 (seven) days. 12 capsule 0   No current facility-administered medications for this visit.    PHYSICAL EXAMINATION: ECOG PERFORMANCE STATUS: 1 - Symptomatic but completely ambulatory  Vitals:   11/01/24 1337  BP: 136/78  Pulse: 69  Resp: 16  Temp: 98.5 F (36.9 C)  SpO2: 98%   Filed Weights   11/01/24 1337  Weight: 160 lb 14.4 oz (73 kg)      LABORATORY DATA:  I have reviewed the data as listed     Latest Ref Rng & Units 10/17/2024    8:50 AM 06/26/2024    3:42 PM 06/26/2024    1:05 PM  CMP  Glucose 70 - 99 mg/dL 91  88    BUN 6 - 24 mg/dL 5  5    Creatinine 9.42 - 1.00 mg/dL 8.98  9.04    Sodium 865 - 144 mmol/L 144  137    Potassium 3.5 - 5.2 mmol/L 4.2  3.6  4.0   Chloride 96 - 106 mmol/L 106  102    CO2 20 - 29 mmol/L 25  29    Calcium 8.7 - 10.2 mg/dL 9.4  8.7    Total Protein 6.0 - 8.5 g/dL 6.9  6.9    Total Bilirubin 0.0 - 1.2 mg/dL 0.3  0.4    Alkaline Phos 41 - 116 IU/L 83  62    AST 0 - 40 IU/L 11  13    ALT 0 - 32 IU/L 5  6      Lab Results  Component Value Date   WBC 5.9 10/18/2023   HGB 13.8 10/18/2023   HCT 41.8 10/18/2023   MCV 93.8 10/18/2023   PLT 270.0 10/18/2023   NEUTROABS 3.4 10/18/2023    ASSESSMENT & PLAN:  Complex sclerosing lesion of right breast Risk assessment: Tyrer-Cuzick 10-year risk: 6% (average risk 2.4%) Lifetime risk 27% (average risk 10.5%)   Recommendation: 1.  Tamoxifen   5 mg a day.  Started 07/30/2021 2. patient stays very active and exercise regularly.  Eats fruits and vegetables and does not eat too much red meat. ------------------------------------------------------------------------------------------------------------------------ Tamoxifen  toxicities:No side effects Chronic fibromyalgia pain is same as before.   Breast cancer surveillance: Mammograms 02/24/2024: Benign breast density category C Breast MRI 04/29/2022: Benign breast density category C.  I recommended doing MRI every other year. We recommended doing the contrast-enhanced mammograms in the future   Return to clinic in 1 year for follow-up      No orders of the defined types were placed in this encounter.  The patient has a good understanding of the overall plan. she agrees with it. she will call with any problems that may develop before the next visit here.  I personally spent a total of 30 minutes in the care of the patient today including preparing to see  the patient, getting/reviewing separately obtained history, performing a medically appropriate exam/evaluation, counseling and educating, placing orders, referring and communicating with other health care professionals, documenting clinical information in the EHR, independently interpreting results, communicating results, and coordinating care.   Viinay K Christionna Poland, MD 11/01/24

## 2024-11-01 NOTE — Progress Notes (Signed)
 Patient Care Team: Fernand Fredy RAMAN, MD as PCP - General (Internal Medicine) Fernand Fredy RAMAN, MD as Referring Physician (Internal Medicine) Dellie Louanne MATSU, MD (General Surgery)  DIAGNOSIS:  Encounter Diagnosis  Name Primary?   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  History of Present Illness Kristy Shaw is a 48 year old female with breast cancer who presents for follow-up regarding medication management and mammogram scheduling.  She is confused about her recent mammogram, which was supposed to be a contrast mammogram but was not performed as such. She has been on tamoxifen  since August 2022 and experiences significant hot flashes. Estrogen prescribed for this is ineffective due to tamoxifen 's blocking effects.  Her current medications include venlafaxine  225 mg daily, Abilify  2 mg, tamoxifen , Dupixent  injections biweekly, and as-needed use of Trelegy inhaler, Mobic , and Singulair. She has stopped gabapentin  and Lasix  but continues potassium supplements.  She smokes and is attempting to quit using patches.     ALLERGIES:  is allergic to gold-containing drug products, adhesive [tape], and doxycycline .  MEDICATIONS:  Current Outpatient Medications  Medication Sig Dispense Refill   albuterol  (VENTOLIN  HFA) 108 (90 Base) MCG/ACT inhaler INHALE ONE TO TWO PUFFS BY MOUTH INTO THE LUNGS EVERY 6 HOURS  AS NEEDED FOR WHEEZING OR SHORTNESS OF BREATH 8.5 g 0   ARIPiprazole  (ABILIFY ) 2 MG tablet TAKE ONE TABLET BY MOUTH ONCE A DAY 90 tablet 1   ASMANEX  HFA 100 MCG/ACT AERO Inhale 2 puffs into the lungs 2 (two) times daily. 13 g 3   cetirizine (ZYRTEC) 10 MG tablet Take by mouth.     clobetasol  ointment (TEMOVATE ) 0.05 % Apply 1 application topically 2 (two) times daily. 30 g 1   dupilumab  (DUPIXENT )  300 MG/2ML prefilled syringe INJECT 1 SYRINGE (300MG ) SUBCUTANEOUSLY EVERY 14 DAYS. 4 mL 5   estradiol (CLIMARA - DOSED IN MG/24 HR) 0.05 mg/24hr patch Place 0.05 mg onto the skin once a week.     fluticasone  (FLONASE ) 50 MCG/ACT nasal spray Place 1 spray into both nostrils daily. 15.8 mL 2   furosemide  (LASIX ) 20 MG tablet Take 1 tablet (20 mg total) by mouth daily for 3 days. 30 tablet 0   gabapentin  (NEURONTIN ) 300 MG capsule Take 1 capsule (300 mg total) by mouth 4 (four) times daily. Has supplies and wants to wean off herself     hydrOXYzine  (VISTARIL ) 25 MG capsule TAKE 1 CAPSULE (25 MG TOTAL) BY MOUTH DAILY AS NEEDED. FOR SEVERE ANXIETY SYMPTOMS 90 capsule 1   meloxicam  (MOBIC ) 15 MG tablet Take 1 tablet (15 mg total) by mouth daily. 30 tablet 0   montelukast (SINGULAIR) 10 MG tablet Take 10 mg by mouth daily.     nicotine  (NICODERM CQ  - DOSED IN MG/24 HR) 7 mg/24hr patch Place 1 patch (7 mg total) onto the skin daily. 28 patch 1   nicotine  polacrilex (NICORETTE ) 2 MG gum Take 1 each (2 mg total) by mouth every 4 (four) hours while awake. 100 tablet 0   potassium chloride  (KLOR-CON ) 10 MEQ tablet Take 1 tablet (10 mEq total) by mouth daily. 30 tablet 4   progesterone (PROMETRIUM) 100 MG capsule Take 100 mg by mouth daily.     solifenacin (VESICARE) 5 MG tablet Take by mouth.     tacrolimus  (PROTOPIC ) 0.1 % ointment  Apply topically 2 (two) times daily. 100 g 2   tamoxifen  (NOLVADEX ) 10 MG tablet Take 0.5 tablets (5 mg total) by mouth daily. 90 tablet 1   TRELEGY ELLIPTA 100-62.5-25 MCG/ACT AEPB Inhale 1 puff into the lungs daily.     venlafaxine  XR (EFFEXOR -XR) 150 MG 24 hr capsule Take 1 capsule (150 mg total) by mouth daily with breakfast. Take along with 75 mg daily 90 capsule 1   venlafaxine  XR (EFFEXOR -XR) 75 MG 24 hr capsule TAKE ONE CAPSULE BY MOUTH ONCE A DAY WITH BREAKFAST ALONG WITH 150MG  DOSE 90 capsule 1   Vitamin D , Ergocalciferol , (DRISDOL ) 1.25 MG (50000 UNIT) CAPS capsule Take  1 capsule (50,000 Units total) by mouth every 7 (seven) days. 12 capsule 0   No current facility-administered medications for this visit.    PHYSICAL EXAMINATION: ECOG PERFORMANCE STATUS: 1 - Symptomatic but completely ambulatory  Vitals:   11/01/24 1337  BP: 136/78  Pulse: 69  Resp: 16  Temp: 98.5 F (36.9 C)  SpO2: 98%   Filed Weights   11/01/24 1337  Weight: 160 lb 14.4 oz (73 kg)    Physical Exam   (exam performed in the presence of a chaperone)  LABORATORY DATA:  I have reviewed the data as listed    Latest Ref Rng & Units 10/17/2024    8:50 AM 06/26/2024    3:42 PM 06/26/2024    1:05 PM  CMP  Glucose 70 - 99 mg/dL 91  88    BUN 6 - 24 mg/dL 5  5    Creatinine 9.42 - 1.00 mg/dL 8.98  9.04    Sodium 865 - 144 mmol/L 144  137    Potassium 3.5 - 5.2 mmol/L 4.2  3.6  4.0   Chloride 96 - 106 mmol/L 106  102    CO2 20 - 29 mmol/L 25  29    Calcium 8.7 - 10.2 mg/dL 9.4  8.7    Total Protein 6.0 - 8.5 g/dL 6.9  6.9    Total Bilirubin 0.0 - 1.2 mg/dL 0.3  0.4    Alkaline Phos 41 - 116 IU/L 83  62    AST 0 - 40 IU/L 11  13    ALT 0 - 32 IU/L 5  6      Lab Results  Component Value Date   WBC 5.9 10/18/2023   HGB 13.8 10/18/2023   HCT 41.8 10/18/2023   MCV 93.8 10/18/2023   PLT 270.0 10/18/2023   NEUTROABS 3.4 10/18/2023    ASSESSMENT & PLAN:  Complex sclerosing lesion of right breast Risk assessment: Tyrer-Cuzick 10-year risk: 6% (average risk 2.4%) Lifetime risk 27% (average risk 10.5%)   Recommendation: 1.  Tamoxifen   5 mg a day.  Started 07/30/2021 2. patient stays very active and exercise regularly.  Eats fruits and vegetables and does not eat too much red meat. ------------------------------------------------------------------------------------------------------------------------ Tamoxifen  toxicities:No side effects Chronic fibromyalgia pain is same as before. Severe Hot flashes: She was started on estrogen replacement therapy by her primary care  physician.  I instructed her to instead stop the tamoxifen  and the estrogen replacement therapy.  Next year we will make a decision on whether to resume the tamoxifen  for another year or so.   Breast cancer surveillance: Mammograms 02/24/2024: Benign breast density category C Breast MRI 04/29/2022: Benign breast density category C.  I recommended doing MRI every other year. We recommended doing the contrast-enhanced mammograms in the future.  However somehow this year she did  not do the contrast mammogram.  Return to clinic in 1 year for follow-up ------------------------------------- Assessment and Plan Assessment & Plan Complex sclerosing lesion of right breast and high risk for breast cancer She is on tamoxifen  for prevention, experiencing significant hot flashes. Estrogen therapy was ineffective due to tamoxifen 's antagonistic effect. - Scheduled contrast mammogram for March 9th. - Consider stopping tamoxifen  and estrogen therapy to allow natural menopause adjustment. - Restart tamoxifen  next year if hot flashes subside.  Vasomotor symptoms of menopause (hot flashes) Hot flashes are a significant side effect of tamoxifen , impacting quality of life. - Consider stopping tamoxifen  and estrogen therapy to allow natural menopause adjustment.  Tobacco use disorder She is ready to quit smoking and has obtained nicotine  patches. - Encouraged use of nicotine  patches to aid smoking cessation.      No orders of the defined types were placed in this encounter.  The patient has a good understanding of the overall plan. she agrees with it. she will call with any problems that may develop before the next visit here.  I personally spent a total of 30 minutes in the care of the patient today including preparing to see the patient, getting/reviewing separately obtained history, performing a medically appropriate exam/evaluation, counseling and educating, placing orders, referring and communicating  with other health care professionals, documenting clinical information in the EHR, independently interpreting results, communicating results, and coordinating care.   Viinay K Raelie Lohr, MD 11/01/24

## 2024-11-01 NOTE — Assessment & Plan Note (Signed)
 Risk assessment: Tyrer-Cuzick 10-year risk: 6% (average risk 2.4%) Lifetime risk 27% (average risk 10.5%)   Recommendation: 1.  Tamoxifen   5 mg a day.  Started 07/30/2021 2. patient stays very active and exercise regularly.  Eats fruits and vegetables and does not eat too much red meat. ------------------------------------------------------------------------------------------------------------------------ Tamoxifen  toxicities:No side effects Chronic fibromyalgia pain is same as before.   Breast cancer surveillance: Mammograms 02/24/2024: Benign breast density category C Breast MRI 04/29/2022: Benign breast density category C.  I recommended doing MRI every other year. We recommended doing the contrast-enhanced mammograms in the future Return to clinic in 1 year for follow-up

## 2024-11-06 ENCOUNTER — Other Ambulatory Visit: Payer: Self-pay | Admitting: Cardiology

## 2024-11-28 ENCOUNTER — Ambulatory Visit: Admitting: Psychiatry

## 2024-12-04 ENCOUNTER — Other Ambulatory Visit: Payer: Self-pay

## 2024-12-04 ENCOUNTER — Encounter: Payer: Self-pay | Admitting: Psychiatry

## 2024-12-04 ENCOUNTER — Ambulatory Visit: Admitting: Psychiatry

## 2024-12-04 VITALS — BP 105/69 | HR 80 | Temp 97.4°F | Ht 69.0 in | Wt 166.2 lb

## 2024-12-04 DIAGNOSIS — G479 Sleep disorder, unspecified: Secondary | ICD-10-CM

## 2024-12-04 DIAGNOSIS — F418 Other specified anxiety disorders: Secondary | ICD-10-CM

## 2024-12-04 DIAGNOSIS — F1590 Other stimulant use, unspecified, uncomplicated: Secondary | ICD-10-CM

## 2024-12-04 DIAGNOSIS — F3342 Major depressive disorder, recurrent, in full remission: Secondary | ICD-10-CM

## 2024-12-04 DIAGNOSIS — F17200 Nicotine dependence, unspecified, uncomplicated: Secondary | ICD-10-CM
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
## 2024-12-07 ENCOUNTER — Other Ambulatory Visit: Payer: Self-pay | Admitting: Cardiology
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
## 2025-01-08 ENCOUNTER — Ambulatory Visit: Admitting: Family Medicine

## 2025-01-08 VITALS — BP 122/78 | HR 72 | Ht 69.0 in | Wt 166.0 lb

## 2025-01-08 DIAGNOSIS — M9901 Segmental and somatic dysfunction of cervical region: Secondary | ICD-10-CM | POA: Diagnosis not present

## 2025-01-08 DIAGNOSIS — M9904 Segmental and somatic dysfunction of sacral region: Secondary | ICD-10-CM | POA: Diagnosis not present

## 2025-01-08 DIAGNOSIS — M9903 Segmental and somatic dysfunction of lumbar region: Secondary | ICD-10-CM

## 2025-01-08 DIAGNOSIS — M25512 Pain in left shoulder: Secondary | ICD-10-CM

## 2025-01-08 DIAGNOSIS — M25511 Pain in right shoulder: Secondary | ICD-10-CM

## 2025-01-08 DIAGNOSIS — M9902 Segmental and somatic dysfunction of thoracic region: Secondary | ICD-10-CM

## 2025-01-08 DIAGNOSIS — G2589 Other specified extrapyramidal and movement disorders: Secondary | ICD-10-CM | POA: Diagnosis not present

## 2025-01-08 DIAGNOSIS — M9908 Segmental and somatic dysfunction of rib cage: Secondary | ICD-10-CM | POA: Diagnosis not present

## 2025-01-08 NOTE — Patient Instructions (Signed)
 Good to see you! See you again in 2-3 months Take care of yourself

## 2025-01-09 ENCOUNTER — Encounter: Payer: Self-pay | Admitting: Family Medicine

## 2025-01-09 NOTE — Assessment & Plan Note (Addendum)
 Continue to work on scapular dyskinesis.   Bilateral Continue to try to do more exercises but having difficulty with her parents as well as other family members recently with from their illnesses.  Discussed potentially doing physical therapy if needed.  Social determinants of health is exercises and 0 days a week secondary to this.  No change in medication.  Follow-up again in 6 to 12 weeks

## 2025-02-12 ENCOUNTER — Ambulatory Visit: Admitting: Cardiology

## 2025-03-04 ENCOUNTER — Telehealth: Admitting: Psychiatry

## 2025-03-11 ENCOUNTER — Ambulatory Visit: Admitting: Family Medicine

## 2025-04-22 ENCOUNTER — Ambulatory Visit: Admitting: Dermatology

## 2025-11-04 ENCOUNTER — Inpatient Hospital Stay: Admitting: Hematology and Oncology
